# Patient Record
Sex: Female | Born: 1965
Health system: Southern US, Community
[De-identification: ages and names within clinical notes are randomized; demographics above are authoritative.]

## PROBLEM LIST (undated history)

## (undated) DIAGNOSIS — R519 Headache, unspecified: Secondary | ICD-10-CM

## (undated) DIAGNOSIS — IMO0002 Reserved for concepts with insufficient information to code with codable children: Secondary | ICD-10-CM

## (undated) DIAGNOSIS — M1990 Unspecified osteoarthritis, unspecified site: Secondary | ICD-10-CM

## (undated) DIAGNOSIS — F32A Depression, unspecified: Secondary | ICD-10-CM

## (undated) DIAGNOSIS — F329 Major depressive disorder, single episode, unspecified: Secondary | ICD-10-CM

## (undated) DIAGNOSIS — M549 Dorsalgia, unspecified: Secondary | ICD-10-CM

## (undated) DIAGNOSIS — K635 Polyp of colon: Secondary | ICD-10-CM

## (undated) DIAGNOSIS — F419 Anxiety disorder, unspecified: Secondary | ICD-10-CM

## (undated) DIAGNOSIS — R51 Headache: Secondary | ICD-10-CM

## (undated) HISTORY — PX: TUBAL LIGATION: SHX77

## (undated) HISTORY — PX: BREAST REDUCTION SURGERY: SHX8

## (undated) HISTORY — PX: MANDIBLE FRACTURE SURGERY: SHX706

## (undated) HISTORY — PX: FRACTURE SURGERY: SHX138

## (undated) HISTORY — PX: COLONOSCOPY WITH ESOPHAGOGASTRODUODENOSCOPY (EGD): SHX5779

## (undated) HISTORY — PX: OTHER SURGICAL HISTORY: SHX169

---

## 2003-02-13 ENCOUNTER — Observation Stay (HOSPITAL_COMMUNITY): Admission: RE | Admit: 2003-02-13 | Discharge: 2003-02-14 | Payer: Self-pay | Admitting: Oral and Maxillofacial Surgery

## 2004-08-18 ENCOUNTER — Ambulatory Visit: Payer: Self-pay

## 2005-03-14 ENCOUNTER — Other Ambulatory Visit: Admission: RE | Admit: 2005-03-14 | Discharge: 2005-03-14 | Payer: Self-pay | Admitting: Obstetrics and Gynecology

## 2007-05-08 ENCOUNTER — Other Ambulatory Visit: Admission: RE | Admit: 2007-05-08 | Discharge: 2007-05-08 | Payer: Self-pay | Admitting: Obstetrics and Gynecology

## 2007-08-21 ENCOUNTER — Ambulatory Visit: Payer: Self-pay | Admitting: Family Medicine

## 2007-11-20 ENCOUNTER — Ambulatory Visit: Payer: Self-pay

## 2008-03-04 ENCOUNTER — Encounter: Payer: Self-pay | Admitting: Anesthesiology

## 2008-07-20 ENCOUNTER — Ambulatory Visit: Payer: Self-pay

## 2008-11-23 ENCOUNTER — Ambulatory Visit: Payer: Self-pay | Admitting: Family Medicine

## 2008-12-08 ENCOUNTER — Ambulatory Visit: Payer: Self-pay | Admitting: Gastroenterology

## 2009-02-16 ENCOUNTER — Ambulatory Visit: Payer: Self-pay

## 2009-03-24 ENCOUNTER — Ambulatory Visit: Payer: Self-pay | Admitting: Physician Assistant

## 2009-11-24 ENCOUNTER — Ambulatory Visit: Payer: Self-pay | Admitting: Family Medicine

## 2010-09-09 NOTE — Op Note (Signed)
[name], BEHL                         ACCOUNT NO.:  [id number]   MEDICAL RECORD NO.:  [id number]                   PATIENT TYPE:  OBV   LOCATION:  0469                                 FACILITY:  [facility]   PHYSICIAN:  Lyndal Pulley. Chales Salmon, M.D.                DATE OF BIRTH:  Sep 06, 1965   DATE OF PROCEDURE:  02/13/2003  DATE OF DISCHARGE:  02/14/2003                                 OPERATIVE REPORT   PREOPERATIVE DIAGNOSIS:  Submental lipodystrophy.   POSTOPERATIVE DIAGNOSIS:  Submental lipodystrophy.   OPERATION/PROCEDURE:  Submental liposuction.   SURGEON:  Lyndal Pulley. Chales Salmon, M.D.   ASSISTANT SURGEON:  De Nurse, M.D.   ANESTHESIA:  General, tumescent and local.   INDICATIONS:  The patient is undergoing upper and lower jaw reconstructive  surgery for correction of a severe malocclusion.  She also was unhappy with  the appearance of her neck form and undergoing liposuction to correct her  neck form simultaneously with the corrective jaw surgery.   DESCRIPTION OF PROCEDURE:  The patient was identified in the holding area  and taken to the operating room where she was placed supine on the operating  room table.  Initially intravenous anesthesia was given and the patient was  intubated through the right nare without difficulty.  A nasoendotracheal  tube was secured and the patient was then turned 90 degrees from anesthesia.  The patient was then prepped and draped in the usual fashion for  orthognathic procedures.   Attention initially was directed to the submental area where a fine surgical  marker was used to outline the areas to be liposuctioned.  This included the  inferior borders of the mandible bilaterally and the thyroid prominence in  the midline.  Two percent lidocaine with 1:100,000 epinephrine was  infiltrated along the submental crease and two areas just beneath the  earlobes bilaterally.  A small stab incision was created with a #11 scalpel  and through the  small stab incision, tumescent fluid was infiltrated in the  subcutaneous supraplatysmal plane for a total of 75 mL.  The tumescent fluid  consisted 500 mL of lactated Ringer's mixed with 400 mg of lidocaine and 1  mg of epinephrine.  Approximately 15 minutes was allowed for hemostasis and  anesthesia.   Next, through the small stab incision, a 2 mm spatula-shaped liposuction  cannula was used to initially create a dissection plane subcutaneously.  This was carefully achieved throughout the entire submental and  submandibular areas.  Once the dissection was completed, a 3 mm, cobra-tip,  liposuction cannula was used to complete the liposuction in a supraplatysmal  subcutaneous plane.  A 2 mm spatula-shape liposuction was then used to  refine the areas as well.  The liposuction was better defined in the  submandibular area through the two small stab incisions created just  inferior to the earlobe.  The pinch test was used to assure  symmetry and  adequate removal of fat.  Once  liposuction was completed, a 6-0 fast-absorbing gut suture was used to close  the small fat incision.  After completion of the orthognathic portion of the  case, a standard facial pressure dressing was placed with quarter-inch foam  placed in the submental area, concentrating pressure in that area.  This was  followed by a four-inch Ace wrap.                                               Lyndal Pulley Chales Salmon, M.D.    TGO/MEDQ  D:  03/02/2003  T:  03/02/2003  Job:  [id number]

## 2010-09-09 NOTE — Op Note (Signed)
[name], [name]                         ACCOUNT NO.:  [id number]   MEDICAL RECORD NO.:  [id number]                   PATIENT TYPE:  OBV   LOCATION:  0469                                 FACILITY:  [facility]   PHYSICIAN:  Lyndal Pulley. Chales Salmon, M.D.                DATE OF BIRTH:  [birth year]   DATE OF PROCEDURE:  02/13/2003  DATE OF DISCHARGE:                                 OPERATIVE REPORT   PREOPERATIVE DIAGNOSES:  1. Maxillary vertical excess.  2. Maxillary skeletal asymmetry.  3. Mandibular sagittal deficiency.  4. Mandibular skeletal asymmetry.   POSTOPERATIVE DIAGNOSES:  1. Maxillary vertical excess.  2. Maxillary skeletal asymmetry.  3. Mandibular sagittal deficiency.  4. Mandibular skeletal asymmetry.   OPERATIONS PERFORMED:  1. Maxillary LeFort I osteotomy with rigid internal fixation.  2. Mandibular bilateral sagittal split ramus osteotomies, with rigid     internal fixation.   SURGEON:  Lyndal Pulley. Chales Salmon, M.D.   RESIDENT SURGEON:  Benjamine Sprague, M.D.   ANESTHESIA:  General via nasoendotracheal tube.   INDICATIONS:  The patient is a 45 year old lady who has been undergoing  orthodontic treatment with Dr. Skip Cantu over the past several years, in  preparation for jaw surgery.  Following detailed clinical records,  diagnostic models and cephalometric x-ray analysis, the above diagnoses were  confirmed.   DESCRIPTION OF PROCEDURE:  The patient was identified in the holding area  and taken to the operating room, where she was placed supine on the  operating room table.  Following the adequate induction of anesthesia, the  patient was nasally intubated without difficulty.  The nasoendotracheal  tube was secured, and the patient turned 90 degrees from anesthesia; there a  standard head wrap was placed to secure the nasoendotracheal tube.  The  patient was then prepped and draped in the usual fashion for the  aforementioned procedures.   Attention initially was directed  intraorally, where approximately 15 cc of  2% lidocaine with 1:100,000 epinephrine was infiltrated along the posterior  mandibular vestibule areas bilaterally.  Additional 10 cc was placed along  the entire maxillary vestibule.  Approximately 15 min was allowed for  anesthesia.   Attention was then directed initially to the right posterior mandibular  vestibule area, where a #15 scalpel was used to make a full-thickness  mucosal incision.  Dissection was carried down to the posterior mandible,  where using a #9 periosteal elevator a full-thickness mucoperiosteal  dissection was completed over the posterior mandibular body, ascending ramus  and medial ramus.  Using adequate retraction, the lingula was identified  along the medial ramus and a Stryker reciprocating saw was used to create  the initial horizontal medial osteotomy, just superior to the lingula.  This  osteotomy was carried through the outer cortex, and then directed in the  sagittal fascia along the ascending ramus anteriorly, to approximately the  second molar tooth.   Next, the  lateral vertical osteotomy was completed in a vertical fashion  through the inferior border of the mandible, completing the osteotomy.  A  moist pack was placed and attention directed to the left posterior mandible.  There the identical procedure was performed.   Attention was then directed to the maxilla, where again a #15 scalpel was  used to create a circumvestibular incision.  This was carried down through  the anterior and lateral maxillary wall.  A #9 periosteal elevator was used  to further reflect tissue, exposing the piriform aperture and lateral  maxillary wall.  Next, a Woodfin elevator was used to carefully dissect the  nasal mucosa from the surrounding bony nasal cavity.   Next, again using adequate retraction, the reciprocating saw was used to  make a horizontal osteotomy through the lateral and anterior maxillary walls  into the bony  nasal cavity.  This was done bilaterally.   Next, using a series of osteotomes, the osteotomy was completed.  The  maxilla was down-fractured without difficulty.  The nasal mucosa was found  to be intact.  The maxilla was further mobilized and all bony interferences  removed with both rongeurs and a large round bur.   The intermediate acrylic splint was then placed.  The patient was brought  into intramaxillary fixation with 26-gauge stainless steel wire loops.  The  maxillomandibular complex was then brought superiorly, and using an external  reference K-wire placed at nasion, the maxilla was brought approximately 5  mm superior.  Once the bony interferences were removed and the maxilla was  brought superiorly passively and into its proper condylar relationship,  rigid fixation was achieved with four L-shaped 1.6 mm Osteo-Med plates.   Prior to the rigid fixation, an anterior septoplasty was completed, to  remove the interfering cartilaginous nasal septum.  This was done in a  subpericondylar fashion.  The wound was closed with 4-0 plain gut suture.   Once the rigid fixation had been achieved, the patient was released from  intramaxillary fixation.  Found to occlude well into her intermediate  acrylic splint.   Attention was then directed to the right posterior mandibular area again.  There, using a series of osteotomes, the mandibular osteotomy was completed.  Once the osteotomy was felt to be completed, further observation found that  the buckled plate had been fractured free from the rest of the mandibular  segment.  The osteotomy was then completed again with the reciprocating saw  and a series of osteotomes, which then separated the proximal from  mandibular segment.  A moist pack was placed and attention directed to the  left posterior mandible, where an identical procedure was performed.  On both sides it was noted that the inferior alveolar nerve was seen and  intact along  its entire course.  The patient was then once again brought  into intramaxillary fixation, using the final acrylic splint with 26-gauge  stainless steel wire loops.   Attention was then directed to the right posterior mandible, where the  proximal segment was placed into its proper condylar relationship and held  secure with the modified Allis clamp.  A 2.0 mm plate was then fashioned to  lie passively across the osteotomy site, held secure with four monocortical  2.0 Osteo-Med screws.  The modified Allis clamp was released and the rigid  fixation confirmed.  The separate buckle plate segment was then placed over  the osteotomy site as well, and held secure with two 2.0 mm lag Osteo-Med  screws.   Attention was then directed to the left posterior mandible, where again the  proximal segment was placed into its proper condylar relationship and held  secure with a modified Allis clamp.  There was some skeletal interferences  between the proximal and distal segments, which were removed both with  rongeurs and a 703 tapered fissure bur.  This allowed the segments to be  brought together passively.  Rigid fixation was then achieved with three  superior border 2.0 Osteo-Med lag screws.  The modified Allis clamp was  removed.  The osteotomy rigid fixation confirmed.   The patient was then released from intramaxillary fixation and occluded well  into her surgical splint.  The surgical splint was removed, and again her  occlusion was adequate.  Both operative sites were then irrigated with  copious amounts of sterile saline, and the mandibular sites closed with 3-0  plain gut suture in running baseball fashion.  Initially in the maxilla, a  nasal cinch suture was placed with an 0 Vicryl suture.  The mucosa was then  closed in an advancement VY fashion, using 3-0 plain gut suture.  The entire  oral cavity was then irrigated with copious amounts of sterile saline and  suctioned free of clots and  debris.  The throat pack was removed and the  final surgical splint wired to the orthodontic appliances of the maxilla  with 26-gauge wire.   She was then again brought into intramaxillary fixation, with heavy bolster  elastics.  A standard facial pressure dressing was then placed.  She was  then allowed to awaken from the anesthesia, extubated in the operating room  and transferred to the post-anesthesia care unit in stable condition.  She  tolerated the procedure well.   ESTIMATED BLOOD LOSS:  100 cc.   INTRAOPERATIVE MEDICATIONS:  1. Ancef 3 g.  2. Decadron 20 mg.                                                 Lyndal Pulley Chales Salmon, M.D.    TGO/MEDQ  D:  02/14/2003  T:  02/14/2003  Job:  [id number]   cc:   Benjamine Sprague, M.D.

## 2011-02-24 ENCOUNTER — Ambulatory Visit: Payer: Self-pay | Admitting: Family Medicine

## 2011-03-15 ENCOUNTER — Ambulatory Visit: Payer: Self-pay | Admitting: Internal Medicine

## 2011-04-08 ENCOUNTER — Ambulatory Visit: Payer: Self-pay | Admitting: Internal Medicine

## 2011-05-17 ENCOUNTER — Ambulatory Visit: Payer: Self-pay | Admitting: Pain Medicine

## 2011-05-23 ENCOUNTER — Ambulatory Visit: Payer: Self-pay | Admitting: Pain Medicine

## 2011-05-29 ENCOUNTER — Ambulatory Visit: Payer: Self-pay | Admitting: Pain Medicine

## 2011-06-02 ENCOUNTER — Ambulatory Visit: Payer: Self-pay | Admitting: Internal Medicine

## 2011-06-02 LAB — CBC WITH DIFFERENTIAL/PLATELET
Basophil #: 0.1 10*3/uL (ref 0.0–0.1)
Basophil %: 0.6 %
Eosinophil #: 1 10*3/uL — ABNORMAL HIGH (ref 0.0–0.7)
Eosinophil %: 8.8 %
HCT: 38.5 % (ref 35.0–47.0)
HGB: 12.8 g/dL (ref 12.0–16.0)
Lymphocyte #: 3.7 10*3/uL — ABNORMAL HIGH (ref 1.0–3.6)
Lymphocyte %: 32.9 %
MCH: 32.8 pg (ref 26.0–34.0)
MCHC: 33.2 g/dL (ref 32.0–36.0)
MCV: 99 fL (ref 80–100)
Monocyte #: 0.9 10*3/uL — ABNORMAL HIGH (ref 0.0–0.7)
Monocyte %: 8.3 %
Neutrophil #: 5.4 10*3/uL (ref 1.4–6.5)
Neutrophil %: 49.4 %
Platelet: 344 10*3/uL (ref 150–440)
RBC: 3.9 10*6/uL (ref 3.80–5.20)
RDW: 13.2 % (ref 11.5–14.5)
WBC: 11.1 10*3/uL — ABNORMAL HIGH (ref 3.6–11.0)

## 2011-06-02 LAB — COMPREHENSIVE METABOLIC PANEL
Albumin: 3.8 g/dL (ref 3.4–5.0)
Alkaline Phosphatase: 82 U/L (ref 50–136)
Anion Gap: 5 — ABNORMAL LOW (ref 7–16)
BUN: 11 mg/dL (ref 7–18)
Bilirubin,Total: 0.1 mg/dL — ABNORMAL LOW (ref 0.2–1.0)
Calcium, Total: 9.1 mg/dL (ref 8.5–10.1)
Chloride: 103 mmol/L (ref 98–107)
Co2: 32 mmol/L (ref 21–32)
Creatinine: 0.84 mg/dL (ref 0.60–1.30)
EGFR (African American): >60
EGFR (Non-African Amer.): >60
Glucose: 110 mg/dL — ABNORMAL HIGH (ref 65–99)
Osmolality: 279 (ref 275–301)
Potassium: 3.8 mmol/L (ref 3.5–5.1)
SGOT(AST): 29 U/L (ref 15–37)
SGPT (ALT): 29 U/L
Sodium: 140 mmol/L (ref 136–145)
Total Protein: 6.9 g/dL (ref 6.4–8.2)

## 2011-06-02 LAB — URINALYSIS, COMPLETE
Bacteria: NEGATIVE
Bilirubin,UR: NEGATIVE
Blood: NEGATIVE
Glucose,UR: NEGATIVE mg/dL (ref 0–75)
Ketone: NEGATIVE
Leukocyte Esterase: NEGATIVE
Nitrite: NEGATIVE
Ph: 6 (ref 4.5–8.0)
Protein: NEGATIVE
RBC,UR: NONE SEEN /HPF (ref 0–5)
Specific Gravity: 1.005 (ref 1.003–1.030)

## 2011-06-04 LAB — URINE CULTURE

## 2011-06-07 ENCOUNTER — Ambulatory Visit: Payer: Self-pay | Admitting: Internal Medicine

## 2011-06-15 ENCOUNTER — Ambulatory Visit: Payer: Self-pay | Admitting: Pain Medicine

## 2011-07-03 ENCOUNTER — Ambulatory Visit: Payer: Self-pay | Admitting: Pain Medicine

## 2011-07-06 ENCOUNTER — Ambulatory Visit: Payer: Self-pay | Admitting: Pain Medicine

## 2011-07-18 ENCOUNTER — Institutional Professional Consult (permissible substitution): Payer: Self-pay | Admitting: Nurse Practitioner

## 2011-08-01 ENCOUNTER — Institutional Professional Consult (permissible substitution): Payer: Self-pay | Admitting: Nurse Practitioner

## 2011-08-01 DIAGNOSIS — R51 Headache: Secondary | ICD-10-CM

## 2011-11-20 ENCOUNTER — Emergency Department: Payer: Self-pay | Admitting: Emergency Medicine

## 2012-03-07 ENCOUNTER — Emergency Department: Payer: Self-pay | Admitting: Emergency Medicine

## 2012-05-24 DIAGNOSIS — G43909 Migraine, unspecified, not intractable, without status migrainosus: Secondary | ICD-10-CM | POA: Insufficient documentation

## 2012-05-24 DIAGNOSIS — F3189 Other bipolar disorder: Secondary | ICD-10-CM | POA: Insufficient documentation

## 2012-05-24 DIAGNOSIS — K219 Gastro-esophageal reflux disease without esophagitis: Secondary | ICD-10-CM | POA: Insufficient documentation

## 2012-05-24 DIAGNOSIS — L732 Hidradenitis suppurativa: Secondary | ICD-10-CM | POA: Insufficient documentation

## 2012-08-16 DIAGNOSIS — L4050 Arthropathic psoriasis, unspecified: Secondary | ICD-10-CM | POA: Insufficient documentation

## 2012-08-16 DIAGNOSIS — L409 Psoriasis, unspecified: Secondary | ICD-10-CM | POA: Insufficient documentation

## 2012-12-20 DIAGNOSIS — N3946 Mixed incontinence: Secondary | ICD-10-CM | POA: Insufficient documentation

## 2012-12-20 DIAGNOSIS — R278 Other lack of coordination: Secondary | ICD-10-CM | POA: Insufficient documentation

## 2012-12-20 DIAGNOSIS — R159 Full incontinence of feces: Secondary | ICD-10-CM | POA: Insufficient documentation

## 2012-12-20 DIAGNOSIS — N816 Rectocele: Secondary | ICD-10-CM | POA: Insufficient documentation

## 2012-12-20 DIAGNOSIS — IMO0002 Reserved for concepts with insufficient information to code with codable children: Secondary | ICD-10-CM | POA: Insufficient documentation

## 2013-02-21 ENCOUNTER — Ambulatory Visit: Payer: Self-pay | Admitting: Physician Assistant

## 2013-02-27 ENCOUNTER — Ambulatory Visit: Payer: Self-pay | Admitting: Family Medicine

## 2013-04-15 ENCOUNTER — Ambulatory Visit: Payer: Self-pay | Admitting: Family Medicine

## 2013-04-22 ENCOUNTER — Ambulatory Visit: Payer: Self-pay | Admitting: Family Medicine

## 2013-08-18 ENCOUNTER — Ambulatory Visit: Payer: Self-pay | Admitting: Podiatry

## 2013-08-19 ENCOUNTER — Emergency Department: Payer: Self-pay | Admitting: Emergency Medicine

## 2014-09-01 ENCOUNTER — Other Ambulatory Visit: Payer: Self-pay | Admitting: Anesthesiology

## 2014-09-01 DIAGNOSIS — M5441 Lumbago with sciatica, right side: Secondary | ICD-10-CM

## 2014-09-01 DIAGNOSIS — M5442 Lumbago with sciatica, left side: Principal | ICD-10-CM

## 2014-09-05 ENCOUNTER — Ambulatory Visit
Admission: RE | Admit: 2014-09-05 | Discharge: 2014-09-05 | Disposition: A | Discharge status: Home / Self Care | Payer: Medicaid Other | Source: Ambulatory Visit | Attending: Anesthesiology | Admitting: Anesthesiology

## 2014-09-05 DIAGNOSIS — M5442 Lumbago with sciatica, left side: Secondary | ICD-10-CM

## 2014-09-05 DIAGNOSIS — M545 Low back pain: Secondary | ICD-10-CM | POA: Insufficient documentation

## 2014-09-05 DIAGNOSIS — M5441 Lumbago with sciatica, right side: Secondary | ICD-10-CM

## 2015-11-15 ENCOUNTER — Emergency Department
Admission: EM | Admit: 2015-11-15 | Discharge: 2015-11-15 | Disposition: A | Discharge status: Home / Self Care | Payer: Medicaid Other | Attending: Emergency Medicine | Admitting: Emergency Medicine

## 2015-11-15 ENCOUNTER — Emergency Department: Payer: Medicaid Other

## 2015-11-15 DIAGNOSIS — F17200 Nicotine dependence, unspecified, uncomplicated: Secondary | ICD-10-CM | POA: Insufficient documentation

## 2015-11-15 DIAGNOSIS — Y929 Unspecified place or not applicable: Secondary | ICD-10-CM | POA: Insufficient documentation

## 2015-11-15 DIAGNOSIS — X501XXA Overexertion from prolonged static or awkward postures, initial encounter: Secondary | ICD-10-CM | POA: Insufficient documentation

## 2015-11-15 DIAGNOSIS — Y9301 Activity, walking, marching and hiking: Secondary | ICD-10-CM | POA: Insufficient documentation

## 2015-11-15 DIAGNOSIS — S93601A Unspecified sprain of right foot, initial encounter: Secondary | ICD-10-CM | POA: Insufficient documentation

## 2015-11-15 DIAGNOSIS — S93401A Sprain of unspecified ligament of right ankle, initial encounter: Secondary | ICD-10-CM | POA: Diagnosis not present

## 2015-11-15 DIAGNOSIS — Y999 Unspecified external cause status: Secondary | ICD-10-CM | POA: Diagnosis not present

## 2015-11-15 DIAGNOSIS — M25571 Pain in right ankle and joints of right foot: Secondary | ICD-10-CM | POA: Diagnosis present

## 2015-11-15 MED ORDER — ACETAMINOPHEN ER 650 MG PO TBCR: 650.0000 mg | EXTENDED_RELEASE_TABLET | Freq: Three times a day (TID) | ORAL | 0 refills | Status: AC | PRN

## 2015-11-15 MED ORDER — ACETAMINOPHEN 325 MG PO TABS
650.0000 mg | ORAL_TABLET | Freq: Once | ORAL | Status: AC
  Administered 2015-11-15: 650 mg via ORAL
  Filled 2015-11-15: qty 2

## 2015-11-15 NOTE — ED Provider Notes (Signed)
[facility] Emergency Department Provider Note  ____________________________________________  Time seen: Approximately 8:37 AM  I have reviewed the triage vital signs and the nursing notes.   HISTORY  Chief Complaint Ankle Pain    HPI [name] is a 50 y.o. female , NAD, presents to the emergency department with one-day history of right ankle and foot pain. States she was walking and twisted the right ankle and foot. Denies any fall or head injury. Do not have any visual changes, chest pain, shortness of breath to cause the fall. Has had pain and swelling about the medial right ankle and foot since that time. Denies any open wounds or lacerations. Has not had any numbness, weakness, tingling.Does note that she has fractured the right ankle and foot in the past.   History reviewed. No pertinent past medical history.  There are no active problems to display for this patient.   Past Surgical History:  Procedure Laterality Date  . BREAST REDUCTION SURGERY    . FRACTURE SURGERY    . MANDIBLE FRACTURE SURGERY    . TUBAL LIGATION      Current Outpatient Rx  . Order #: [id number] Class: Historical Med  . Order #: [id number] Class: Print    Allergies Review of patient's allergies indicates no known allergies.  No family history on file.  Social History Social History  Substance Use Topics  . Smoking status: Current Every Day Smoker  . Smokeless tobacco: Never Used  . Alcohol use No     Review of Systems  Constitutional: No fatigue Eyes: No visual changes. Cardiovascular: No chest pain. Respiratory:  No shortness of breath. Musculoskeletal: Positive right foot and ankle pain. Negative for back, knee, hip pain.  Skin: Eyes of swelling and bruising right foot. Negative for rash. Neurological: Negative for headaches, focal weakness or numbness. No tingling 10-point ROS otherwise negative.  ____________________________________________   PHYSICAL  EXAM:  VITAL SIGNS: ED Triage Vitals [11/15/15 0822]  Enc Vitals Group     BP (!) 101/56     Pulse Rate 65     Resp 16     Temp 97.9 F (36.6 C)     Temp Source Oral     SpO2 98 %     Weight 183 lb (83 kg)     Height 5\' 3"  (1.6 m)     Head Circumference      Peak Flow      Pain Score 8     Pain Loc      Pain Edu?      Excl. in [place]?      Constitutional: Alert and oriented. Well appearing and in no acute distress. Eyes: Conjunctivae are normal.  Head: Atraumatic. Cardiovascular: Good peripheral circulation with 2+ pulses noted in the right lower extremity. Capillary refill is brisk in all digits of the right foot. Respiratory: Normal respiratory effort without tachypnea or retractions.  Musculoskeletal: Tenderness to palpation about the medial right ankle and foot without any bony abnormalities or crepitus noted. Full range of motion of the right ankle but with pain with full extension. No laxity with anterior posterior drawer. No laxity with varus or valgus stress. No pain with inversion or eversion. No lower extremity tenderness nor edema.  No joint effusions. Neurologic:  Normal speech and language. No gross focal neurologic deficits are appreciated.  Skin:  Trace swelling and blue ecchymosis noted about the medial right foot. No open wounds or lacerations. No skin sores. Skin is warm, dry and intact.  No rash noted. Psychiatric: Mood and affect are normal. Speech and behavior are normal. Patient exhibits appropriate insight and judgement.   ____________________________________________   LABS  None ____________________________________________  EKG  None ____________________________________________  RADIOLOGY I have personally viewed and evaluated these images (plain radiographs) as part of my medical decision making, as well as reviewing the written report by the radiologist.  Dg Ankle Complete Right  Result Date: 11/15/2015 CLINICAL DATA:  Pain following rolling  type injury 1 day prior EXAM: RIGHT ANKLE - COMPLETE 3+ VIEW COMPARISON:  None. FINDINGS: Frontal, oblique, and lateral views were obtained. There is mild generalized soft tissue swelling. There is no appreciable fracture or joint effusion. Ankle mortise appears intact. There is no appreciable joint space narrowing. There is a minimal posterior calcaneal spur. IMPRESSION: Soft tissue swelling. No demonstrable fracture. Ankle mortise appears intact. Minimal posterior calcaneal spur. Electronically Signed   By: Lowella Grip III M.D.   On: 11/15/2015 09:00  Dg Foot Complete Right  Result Date: 11/15/2015 CLINICAL DATA:  Rolling type injury 1 day prior with pain medially EXAM: RIGHT FOOT COMPLETE - 3+ VIEW COMPARISON:  None. FINDINGS: Frontal, oblique, lateral views were obtained. Patient has had a previous bunionectomy. There are hands the distal first metatarsal. There is evidence of a prior fracture along the proximal aspect of the fifth proximal phalanx with a displaced bony fragment medially. This bony fragment as well corticated suggesting chronicity of the finding. A tiny calcification in the lateral aspect of the first MTP joint is likely of arthropathic etiology. No acute fracture is appreciable. No dislocation. The joint spaces appear unremarkable. There is a minimal posterior calcaneal spur. IMPRESSION: Postoperative change in the distal first metatarsal. Evidence of prior fracture along medial aspect of the proximal portion of the fifth proximal phalanx. The small avulsed bony fragment in this area is well corticated, indicating chronicity. No acute fracture or dislocation evident. There is a minimal posterior calcaneal spur. A small calcification lateral aspect first MTP joint is felt to be of arthropathic etiology. Electronically Signed   By: Lowella Grip III M.D.   On: 11/15/2015 08:59   ____________________________________________    PROCEDURES  Procedure(s) performed:  None      Medications  acetaminophen (TYLENOL) tablet 650 mg (650 mg Oral Given 11/15/15 0912)     ____________________________________________   INITIAL IMPRESSION / ASSESSMENT AND PLAN / ED COURSE  Pertinent labs & imaging results that were available during my care of the patient were reviewed by me and considered in my medical decision making (see chart for details).  Patient's diagnosis is consistent with right ankle and foot sprain. Patient was placed in an Ace wrap and given crutches for comfort care. Should apply ice and elevate the right foot and ankle when not ambulating. Patient will be discharged home with prescriptions for Tylenol to take as needed. Patient is to follow up with her primary care provider if symptoms persist past this treatment course. Patient is given ED precautions to return to the ED for any worsening or new symptoms.      ____________________________________________  FINAL CLINICAL IMPRESSION(S) / ED DIAGNOSES  Final diagnoses:  Ankle sprain, right, initial encounter  Foot sprain, right, initial encounter      NEW MEDICATIONS STARTED DURING THIS VISIT:  Discharge Medication List as of 11/15/2015  9:08 AM    START taking these medications   Details  acetaminophen (ACETAMINOPHEN 8 HOUR) 650 MG CR tablet Take 1 tablet (650 mg total) by mouth  every 8 (eight) hours as needed for pain., Starting Mon 11/15/2015, Until Mon 11/22/2015, [place], PA-C 11/15/15 1012    Nance Pear, MD 11/15/15 1158

## 2015-11-15 NOTE — Discharge Instructions (Signed)
Rest, Ice, Elevate right foot/ankle as needed per exit care instructions.

## 2015-11-15 NOTE — ED Notes (Signed)
States she rolled her right foot while walking this am   Having increased pain across top of foot positive pulses   Unable to bear full wt d/t pain

## 2015-11-15 NOTE — ED Triage Notes (Signed)
Pt states she tripped and fell yesterday and is c/o right foot/ankle pain,,

## 2016-02-21 ENCOUNTER — Other Ambulatory Visit: Payer: Self-pay | Admitting: Gastroenterology

## 2016-02-21 DIAGNOSIS — K219 Gastro-esophageal reflux disease without esophagitis: Secondary | ICD-10-CM

## 2016-02-21 DIAGNOSIS — R1310 Dysphagia, unspecified: Secondary | ICD-10-CM

## 2016-02-29 ENCOUNTER — Ambulatory Visit: Admission: RE | Admit: 2016-02-29 | Payer: Medicaid Other | Source: Ambulatory Visit

## 2016-03-09 ENCOUNTER — Ambulatory Visit: Payer: Medicaid Other | Attending: Gastroenterology

## 2016-05-18 ENCOUNTER — Encounter: Payer: Self-pay | Admitting: *Deleted

## 2016-05-19 ENCOUNTER — Encounter
Admission: RE | Disposition: A | Discharge status: Home / Self Care | Payer: Self-pay | Source: Ambulatory Visit | Attending: Gastroenterology

## 2016-05-19 ENCOUNTER — Ambulatory Visit
Admission: RE | Admit: 2016-05-19 | Discharge: 2016-05-19 | Disposition: A | Discharge status: Home / Self Care | Payer: Medicaid Other | Source: Ambulatory Visit | Attending: Gastroenterology | Admitting: Gastroenterology

## 2016-05-19 ENCOUNTER — Encounter: Payer: Self-pay | Admitting: *Deleted

## 2016-05-19 ENCOUNTER — Ambulatory Visit: Payer: Medicaid Other | Admitting: Anesthesiology

## 2016-05-19 DIAGNOSIS — K621 Rectal polyp: Secondary | ICD-10-CM | POA: Diagnosis not present

## 2016-05-19 DIAGNOSIS — Z86010 Personal history of colonic polyps: Secondary | ICD-10-CM | POA: Insufficient documentation

## 2016-05-19 DIAGNOSIS — Z79899 Other long term (current) drug therapy: Secondary | ICD-10-CM | POA: Diagnosis not present

## 2016-05-19 DIAGNOSIS — F419 Anxiety disorder, unspecified: Secondary | ICD-10-CM | POA: Diagnosis not present

## 2016-05-19 DIAGNOSIS — Z1211 Encounter for screening for malignant neoplasm of colon: Secondary | ICD-10-CM | POA: Diagnosis not present

## 2016-05-19 DIAGNOSIS — R51 Headache: Secondary | ICD-10-CM | POA: Insufficient documentation

## 2016-05-19 DIAGNOSIS — F329 Major depressive disorder, single episode, unspecified: Secondary | ICD-10-CM | POA: Insufficient documentation

## 2016-05-19 DIAGNOSIS — F17200 Nicotine dependence, unspecified, uncomplicated: Secondary | ICD-10-CM | POA: Diagnosis not present

## 2016-05-19 DIAGNOSIS — M1990 Unspecified osteoarthritis, unspecified site: Secondary | ICD-10-CM | POA: Diagnosis not present

## 2016-05-19 HISTORY — DX: Anxiety disorder, unspecified: F41.9

## 2016-05-19 HISTORY — DX: Depression, unspecified: F32.A

## 2016-05-19 HISTORY — DX: Headache: R51

## 2016-05-19 HISTORY — PX: COLONOSCOPY WITH PROPOFOL: SHX5780

## 2016-05-19 HISTORY — DX: Major depressive disorder, single episode, unspecified: F32.9

## 2016-05-19 HISTORY — DX: Headache, unspecified: R51.9

## 2016-05-19 HISTORY — DX: Unspecified osteoarthritis, unspecified site: M19.90

## 2016-05-19 HISTORY — DX: Reserved for concepts with insufficient information to code with codable children: IMO0002

## 2016-05-19 LAB — POCT PREGNANCY, URINE: Preg Test, Ur: NEGATIVE

## 2016-05-19 SURGERY — COLONOSCOPY WITH PROPOFOL
Anesthesia: General

## 2016-05-19 MED ORDER — DEXAMETHASONE SODIUM PHOSPHATE 10 MG/ML IJ SOLN
INTRAMUSCULAR | Status: AC
  Filled 2016-05-19: qty 1

## 2016-05-19 MED ORDER — PROPOFOL 500 MG/50ML IV EMUL
INTRAVENOUS | Status: AC
  Filled 2016-05-19: qty 50

## 2016-05-19 MED ORDER — FENTANYL CITRATE (PF) 100 MCG/2ML IJ SOLN
INTRAMUSCULAR | Status: AC
  Filled 2016-05-19: qty 2

## 2016-05-19 MED ORDER — EPHEDRINE 5 MG/ML INJ
INTRAVENOUS | Status: AC
  Filled 2016-05-19: qty 10

## 2016-05-19 MED ORDER — PROPOFOL 500 MG/50ML IV EMUL
INTRAVENOUS | Status: DC | PRN
  Administered 2016-05-19: 120 ug/kg/min via INTRAVENOUS

## 2016-05-19 MED ORDER — FENTANYL CITRATE (PF) 100 MCG/2ML IJ SOLN
INTRAMUSCULAR | Status: DC | PRN
  Administered 2016-05-19: 100 ug via INTRAVENOUS

## 2016-05-19 MED ORDER — SODIUM CHLORIDE 0.9 % IV SOLN: INTRAVENOUS | Status: DC

## 2016-05-19 MED ORDER — EPHEDRINE SULFATE 50 MG/ML IJ SOLN
INTRAMUSCULAR | Status: DC | PRN
  Administered 2016-05-19 (×2): 10 mg via INTRAVENOUS

## 2016-05-19 MED ORDER — LIDOCAINE HCL (CARDIAC) 20 MG/ML IV SOLN
INTRAVENOUS | Status: DC | PRN
  Administered 2016-05-19: 2 mL via INTRAVENOUS

## 2016-05-19 MED ORDER — SODIUM CHLORIDE 0.9 % IV SOLN
INTRAVENOUS | Status: DC
  Administered 2016-05-19: 09:00:00 via INTRAVENOUS

## 2016-05-19 MED ORDER — MIDAZOLAM HCL 2 MG/2ML IJ SOLN
INTRAMUSCULAR | Status: DC | PRN
  Administered 2016-05-19: 2 mg via INTRAVENOUS

## 2016-05-19 MED ORDER — PROPOFOL 10 MG/ML IV BOLUS
INTRAVENOUS | Status: DC | PRN
  Administered 2016-05-19: 30 mg via INTRAVENOUS

## 2016-05-19 MED ORDER — MIDAZOLAM HCL 2 MG/2ML IJ SOLN
INTRAMUSCULAR | Status: AC
  Filled 2016-05-19: qty 2

## 2016-05-19 NOTE — Anesthesia Post-op Follow-up Note (Signed)
Anesthesia QCDR form completed.        

## 2016-05-19 NOTE — Anesthesia Preprocedure Evaluation (Signed)
Anesthesia Evaluation  Patient identified by MRN, date of birth, ID band Patient awake    Reviewed: Allergy & Precautions, NPO status , Patient's Chart, lab work & pertinent test results  Airway Mallampati: II       Dental  (+) Teeth Intact   Pulmonary Current Smoker,    breath sounds clear to auscultation       Cardiovascular Exercise Tolerance: Good  Rhythm:Regular     Neuro/Psych  Headaches, Anxiety Depression    GI/Hepatic negative GI ROS, Neg liver ROS,   Endo/Other  negative endocrine ROS  Renal/GU negative Renal ROS     Musculoskeletal   Abdominal Normal abdominal exam  (+)   Peds negative pediatric ROS (+)  Hematology negative hematology ROS (+)   Anesthesia Other Findings   Reproductive/Obstetrics                             Anesthesia Physical Anesthesia Plan  ASA: II  Anesthesia Plan: General   Post-op Pain Management:    Induction: Intravenous  Airway Management Planned: Natural Airway and Nasal Cannula  Additional Equipment:   Intra-op Plan:   Post-operative Plan:   Informed Consent: I have reviewed the patients History and Physical, chart, labs and discussed the procedure including the risks, benefits and alternatives for the proposed anesthesia with the patient or authorized representative who has indicated his/her understanding and acceptance.     Plan Discussed with: Surgeon  Anesthesia Plan Comments:         Anesthesia Quick Evaluation

## 2016-05-19 NOTE — Anesthesia Postprocedure Evaluation (Signed)
Anesthesia Post Note  Patient: [name]  Procedure(s) Performed: Procedure(s) (LRB): COLONOSCOPY WITH PROPOFOL (N/A)  Patient location during evaluation: PACU Anesthesia Type: General Level of consciousness: awake Pain management: pain level controlled Vital Signs Assessment: post-procedure vital signs reviewed and stable Respiratory status: spontaneous breathing Cardiovascular status: stable Anesthetic complications: no     Last Vitals:  Vitals:   05/19/16 1027 05/19/16 1037  BP: 100/61 108/66  Pulse: 62 (!) 58  Resp: 13 15  Temp:      Last Pain:  Vitals:   05/19/16 1007  TempSrc: Tympanic                 VAN STAVEREN,Venezia Sargeant

## 2016-05-19 NOTE — H&P (Signed)
Outpatient short stay form Pre-procedure 05/19/2016 9:48 AM Lollie Sails MD  Primary Physician: Dr. Delight Stare  Reason for visit:  Colonoscopy  History of present illness:  Patient is a 51 year old female presenting today as above. She has personal history of colon polyps. She tolerated her prep well. She denies use of any aspirin or blood thinning agents.  It is of note that she was initially seen for dysphagia. We will arrange for a barium swallow prior to EGD however patient did not do the barium swallow. As such that has been rescheduled and were holding the EGD until we have that result.    Current Facility-Administered Medications:  .  0.9 %  sodium chloride infusion, , Intravenous, Continuous, Lollie Sails, MD, Last Rate: 20 mL/hr at 05/19/16 0859 .  0.9 %  sodium chloride infusion, , Intravenous, Continuous, Lollie Sails, MD  Prescriptions Prior to Admission  Medication Sig Dispense Refill Last Dose  . ALPRAZolam (XANAX) 1 MG tablet Take 1 mg by mouth 4 (four) times daily.   05/18/2016 at Unknown time  . amphetamine-dextroamphetamine (ADDERALL) 15 MG tablet Take 15 mg by mouth 2 (two) times daily.   Past Week at Unknown time  . buPROPion (WELLBUTRIN XL) 300 MG 24 hr tablet Take 300 mg by mouth daily.   05/18/2016 at Unknown time  . clobetasol ointment (TEMOVATE) [id number] % Apply 1 application topically 2 (two) times daily.     . diclofenac (FLECTOR) 1.3 % PTCH Place 1 patch onto the skin daily.     . diclofenac sodium (VOLTAREN) 1 % GEL Apply topically 4 (four) times daily.     Marland Kitchen escitalopram (LEXAPRO) 20 MG tablet Take 20 mg by mouth daily.   Past Week at Unknown time  . fluocinonide (LIDEX) 0.05 % external solution Apply 1 application topically 2 (two) times daily.     . fluticasone (FLONASE) 50 MCG/ACT nasal spray Place 2 sprays into both nostrils daily.     Marland Kitchen omeprazole (PRILOSEC) 20 MG capsule Take 20 mg by mouth 2 (two) times daily before a meal.   05/18/2016 at  Unknown time  . oxycodone (OXY-IR) 5 MG capsule Take 10 mg by mouth 3 (three) times daily.   Past Week at Unknown time     Allergies  Allergen Reactions  . Topiramate Hives     Past Medical History:  Diagnosis Date  . Anxiety   . Arthritis    PSORIATIC ARTHRITIS  . Depression   . Headache    MIGRAINE  . Ulcer ([place])     Review of systems:      Physical Exam    Heart and lungs: Regular rate and rhythm without rub or gallop, lungs are bilaterally clear.    HEENT: Normocephalic atraumatic eyes are anicteric    Other:     Pertinant exam for procedure: Soft nontender nondistended bowel sounds positive normoactive.    Planned proceedures: Colonoscopy and indicated procedures. I have discussed the risks benefits and complications of procedures to include not limited to bleeding, infection, perforation and the risk of sedation and the patient wishes to proceed.    Lollie Sails, MD Gastroenterology 05/19/2016  9:48 AM

## 2016-05-19 NOTE — Progress Notes (Signed)
Colonoscopy aborted due to poor prep. Rectal polyps noted per Dr. Donnella Sham but he will take those out on return visit

## 2016-05-19 NOTE — Transfer of Care (Signed)
Immediate Anesthesia Transfer of Care Note  Patient: [name]  Procedure(s) Performed: Procedure(s): COLONOSCOPY WITH PROPOFOL (N/A)  Patient Location: PACU  Anesthesia Type:General  Level of Consciousness: awake  Airway & Oxygen Therapy: Patient Spontanous Breathing and Patient connected to nasal cannula oxygen  Post-op Assessment: Report given to RN and Post -op Vital signs reviewed and stable  Post vital signs: Reviewed  Last Vitals:  Vitals:   05/19/16 0839  BP: 91/73  Pulse: 66  Resp: 20  Temp: 36.4 C    Last Pain:  Vitals:   05/19/16 0839  TempSrc: Tympanic         Complications: No apparent anesthesia complications

## 2016-05-19 NOTE — Op Note (Signed)
[facility] Gastroenterology Patient Name: [name] Procedure Date: 05/19/2016 9:37 AM MRN: [id number] Account #: [id number] Date of Birth: 11-Oct-1965 Admit Type: Outpatient Age: 51 Room: [facility] ENDO ROOM 1 Gender: Female Note Status: Finalized Procedure:            Colonoscopy Indications:          Personal history of colonic polyps Providers:            Lollie Sails, MD Referring MD:         Marguerita Merles, MD (Referring MD) Medicines:            Monitored Anesthesia Care Complications:        No immediate complications. Procedure:            Pre-Anesthesia Assessment:                       - ASA Grade Assessment: II - A patient with mild                        systemic disease.                       After obtaining informed consent, the colonoscope was                        passed under direct vision. Throughout the procedure,                        the patient's blood pressure, pulse, and oxygen                        saturations were monitored continuously. The                        Colonoscope was introduced through the anus with the                        intention of advancing to the cecum. The scope was                        advanced to the sigmoid colon before the procedure was                        aborted. Medications were given. The colonoscopy was                        performed with difficulty due to poor endoscopic                        visualization. The patient tolerated the procedure. The                        quality of the bowel preparation was poor. Findings:      Several small polyps were found in the rectum. The polyps were sessile.       Prep was not adequate for evaluation and these were not removed. Impression:           - Preparation of the colon was poor.                       -  Small polyps in the rectum.                       - No specimens collected. Recommendation:       - repeat prep and reschedule Procedure  Code(s):    --- Professional ---                       [phone], 53, Colonoscopy, flexible; diagnostic, including                        collection of specimen(s) by brushing or washing, when                        performed (separate procedure) Diagnosis Code(s):    --- Professional ---                       K62.1, Rectal polyp                       Z86.010, Personal history of colonic polyps CPT copyright 2016 [employer]. All rights reserved. The codes documented in this report are preliminary and upon coder review may  be revised to meet current compliance requirements. Lollie Sails, MD 05/19/2016 10:06:16 AM This report has been signed electronically. Number of Addenda: 0 Note Initiated On: 05/19/2016 9:37 AM Total Procedure Duration: 0 hours 5 minutes 26 seconds       [facility]

## 2016-05-22 ENCOUNTER — Encounter: Payer: Self-pay | Admitting: Gastroenterology

## 2016-10-02 ENCOUNTER — Other Ambulatory Visit: Payer: Self-pay | Admitting: Family Medicine

## 2016-10-02 DIAGNOSIS — Z1231 Encounter for screening mammogram for malignant neoplasm of breast: Secondary | ICD-10-CM

## 2016-10-23 DIAGNOSIS — F1190 Opioid use, unspecified, uncomplicated: Secondary | ICD-10-CM | POA: Insufficient documentation

## 2016-10-23 DIAGNOSIS — Z79891 Long term (current) use of opiate analgesic: Secondary | ICD-10-CM | POA: Insufficient documentation

## 2016-10-23 DIAGNOSIS — G894 Chronic pain syndrome: Secondary | ICD-10-CM | POA: Insufficient documentation

## 2016-10-24 ENCOUNTER — Ambulatory Visit: Payer: Medicaid Other | Attending: Nurse Practitioner | Admitting: Nurse Practitioner

## 2016-10-24 NOTE — Progress Notes (Deleted)
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 redness, or swelling Alignment: Symmetrical Functional ROM: Unrestricted ROM      Stability: No instability detected Muscle strength & Tone: Functionally intact Sensory: Unimpaired Palpation: No palpable anomalies       Provocative Tests: Lumbar Hyperextension and rotation test: evaluation deferred today       Patrick's Maneuver: evaluation deferred today                    Gait & Posture Assessment  Ambulation: Unassisted Gait: Relatively normal for age and body habitus Posture: WNL   Lower Extremity Exam    Side: Right lower extremity  Side: Left lower extremity  Inspection: No masses, redness, swelling, or asymmetry. No contractures  Inspection: No masses, redness, swelling, or asymmetry. No contractures  Functional ROM: Unrestricted ROM          Functional ROM:  Unrestricted ROM          Muscle strength & Tone: Functionally intact  Muscle strength & Tone: Functionally intact  Sensory: Unimpaired  Sensory: Unimpaired  Palpation: No palpable anomalies  Palpation: No palpable anomalies   Assessment  Primary Diagnosis & Pertinent Problem List: Diagnoses of Long term current use of opiate analgesic, Long term prescription opiate use, Opiate use, and Chronic pain syndrome were pertinent to this visit.  Visit Diagnosis: 1. Long term current use of opiate analgesic   2. Long term prescription opiate use   3. Opiate use   4. Chronic pain syndrome    Plan of Care  Initial treatment plan:  Please be advised that as per protocol, today's visit has been an evaluation only. We have not taken over the patient's controlled substance management.  Problem-specific plan: No problem-specific Assessment & Plan notes found for this encounter.  Ordered Lab-work, Procedure(s), Referral(s), & Consult(s): No orders of the defined types were placed in this encounter.  Pharmacotherapy: Medications ordered:  No orders of the defined types were placed in this encounter.  Medications administered during this visit: Ms. Vossler had no medications administered during this visit.   Pharmacotherapy under consideration:  Opioid Analgesics: The patient was informed that there is no guarantee that she would be a candidate for opioid analgesics. The decision will be made following CDC guidelines. This decision will be based on the results of diagnostic studies, as well as Ms. Mcquaid's risk profile.  Membrane stabilizer: To be determined at a later time Muscle relaxant: To be determined at a later time NSAID: To be determined at a later time Other analgesic(s): To be determined at a later time   Interventional therapies under consideration: Ms. Brester was informed that there is no guarantee that she would be a candidate for interventional therapies. The decision will be based on  the results of diagnostic studies, as well as Ms. Loncar's risk profile.  Possible procedure(s): ***   Provider-requested follow-up: No Follow-up on file.  Future Appointments Date Time Provider [place]  10/24/2016 1:00 PM Vevelyn Francois, NP [facility] None    Primary Care Physician: Marguerita Merles, MD Location: [facility] Note by:  Date: 10/24/2016; Time: 11:16 AM  Pain Score Disclaimer: We use  the NRS-11 scale. This is a self-reported, subjective measurement of pain severity with only modest accuracy. It is used primarily to identify changes within a particular patient. It must be understood that outpatient pain scales are significantly less accurate that those used for research, where they can be applied under ideal controlled circumstances with minimal exposure to variables. In reality, the score is likely to be a combination of pain intensity and pain affect, where pain affect describes the degree of emotional arousal or changes in action readiness caused by the sensory experience of pain. Factors such as social and work situation, setting, emotional state, anxiety levels, expectation, and prior pain experience may influence pain perception and show large inter-individual differences that may also be affected by time variables.  Patient instructions provided during this appointment: There are no Patient Instructions on file for this visit.

## 2016-11-27 ENCOUNTER — Ambulatory Visit: Payer: Medicaid Other | Attending: Pain Medicine | Admitting: Pain Medicine

## 2016-11-27 ENCOUNTER — Encounter: Payer: Self-pay | Admitting: Pain Medicine

## 2016-11-27 VITALS — BP 130/94 | HR 85 | Temp 98.2°F | Resp 16 | Ht 62.0 in | Wt 170.0 lb

## 2016-11-27 DIAGNOSIS — M25561 Pain in right knee: Secondary | ICD-10-CM | POA: Insufficient documentation

## 2016-11-27 DIAGNOSIS — R32 Unspecified urinary incontinence: Secondary | ICD-10-CM | POA: Diagnosis not present

## 2016-11-27 DIAGNOSIS — G894 Chronic pain syndrome: Secondary | ICD-10-CM

## 2016-11-27 DIAGNOSIS — G8929 Other chronic pain: Secondary | ICD-10-CM | POA: Diagnosis not present

## 2016-11-27 DIAGNOSIS — M069 Rheumatoid arthritis, unspecified: Secondary | ICD-10-CM | POA: Insufficient documentation

## 2016-11-27 DIAGNOSIS — F1190 Opioid use, unspecified, uncomplicated: Secondary | ICD-10-CM | POA: Diagnosis not present

## 2016-11-27 DIAGNOSIS — M25511 Pain in right shoulder: Secondary | ICD-10-CM | POA: Diagnosis not present

## 2016-11-27 DIAGNOSIS — M79604 Pain in right leg: Secondary | ICD-10-CM

## 2016-11-27 DIAGNOSIS — E079 Disorder of thyroid, unspecified: Secondary | ICD-10-CM | POA: Insufficient documentation

## 2016-11-27 DIAGNOSIS — Z79891 Long term (current) use of opiate analgesic: Secondary | ICD-10-CM | POA: Diagnosis not present

## 2016-11-27 DIAGNOSIS — M5441 Lumbago with sciatica, right side: Secondary | ICD-10-CM | POA: Diagnosis not present

## 2016-11-27 DIAGNOSIS — M47816 Spondylosis without myelopathy or radiculopathy, lumbar region: Secondary | ICD-10-CM | POA: Insufficient documentation

## 2016-11-27 DIAGNOSIS — M79671 Pain in right foot: Secondary | ICD-10-CM | POA: Diagnosis not present

## 2016-11-27 DIAGNOSIS — G501 Atypical facial pain: Secondary | ICD-10-CM | POA: Insufficient documentation

## 2016-11-27 DIAGNOSIS — L4050 Arthropathic psoriasis, unspecified: Secondary | ICD-10-CM | POA: Diagnosis not present

## 2016-11-27 DIAGNOSIS — M545 Low back pain: Secondary | ICD-10-CM | POA: Diagnosis not present

## 2016-11-27 DIAGNOSIS — Z87442 Personal history of urinary calculi: Secondary | ICD-10-CM | POA: Diagnosis not present

## 2016-11-27 DIAGNOSIS — F419 Anxiety disorder, unspecified: Secondary | ICD-10-CM | POA: Diagnosis not present

## 2016-11-27 DIAGNOSIS — F319 Bipolar disorder, unspecified: Secondary | ICD-10-CM | POA: Insufficient documentation

## 2016-11-27 DIAGNOSIS — K219 Gastro-esophageal reflux disease without esophagitis: Secondary | ICD-10-CM | POA: Insufficient documentation

## 2016-11-27 DIAGNOSIS — L732 Hidradenitis suppurativa: Secondary | ICD-10-CM | POA: Insufficient documentation

## 2016-11-27 DIAGNOSIS — M26609 Unspecified temporomandibular joint disorder, unspecified side: Secondary | ICD-10-CM | POA: Diagnosis not present

## 2016-11-27 DIAGNOSIS — G43909 Migraine, unspecified, not intractable, without status migrainosus: Secondary | ICD-10-CM | POA: Diagnosis not present

## 2016-11-27 DIAGNOSIS — Z8739 Personal history of other diseases of the musculoskeletal system and connective tissue: Secondary | ICD-10-CM | POA: Insufficient documentation

## 2016-11-27 DIAGNOSIS — M5416 Radiculopathy, lumbar region: Secondary | ICD-10-CM | POA: Diagnosis not present

## 2016-11-27 DIAGNOSIS — M25512 Pain in left shoulder: Secondary | ICD-10-CM

## 2016-11-27 DIAGNOSIS — F17210 Nicotine dependence, cigarettes, uncomplicated: Secondary | ICD-10-CM | POA: Diagnosis not present

## 2016-11-27 DIAGNOSIS — Z79899 Other long term (current) drug therapy: Secondary | ICD-10-CM

## 2016-11-27 DIAGNOSIS — M542 Cervicalgia: Secondary | ICD-10-CM | POA: Diagnosis not present

## 2016-11-27 DIAGNOSIS — M4696 Unspecified inflammatory spondylopathy, lumbar region: Secondary | ICD-10-CM

## 2016-11-27 DIAGNOSIS — M25551 Pain in right hip: Secondary | ICD-10-CM

## 2016-11-27 DIAGNOSIS — M47896 Other spondylosis, lumbar region: Secondary | ICD-10-CM

## 2016-11-27 DIAGNOSIS — M533 Sacrococcygeal disorders, not elsewhere classified: Secondary | ICD-10-CM | POA: Diagnosis not present

## 2016-11-27 NOTE — Progress Notes (Signed)
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 Atypical facial pain (Bilateral) (R>L) 11/27/2016  . History of TMJ syndrome 11/27/2016  . 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 Chronic sacroiliac joint pain (Bilateral) (L>R) 11/27/2016  . Lumbar facet hypertrophy (Multilevel) (Bilateral) 11/27/2016   Resolved Ambulatory Problems    Diagnosis Date Noted  . No Resolved Ambulatory Problems   Past Medical History:  Diagnosis Date  . Anxiety   . Arthritis   . Depression   . Headache   . Ulcer    Constitutional Exam  General appearance: Well nourished, well developed, and well hydrated. In no apparent acute distress Vitals:   11/27/16 1149  BP: (!) 130/94  Pulse: 85  Resp: 16  Temp: 98.2 F (36.8 C)  SpO2: 100%  Weight: 170 lb (77.1 kg)  Height: '5\' 2"'  (1.575 m)   BMI Assessment: Estimated body mass index is 31.09 kg/m as calculated from the following:   Height as of this encounter: '5\' 2"'  (1.575 m).   Weight as of this  encounter: 170 lb (77.1 kg).  BMI interpretation table: BMI level Category Range association with higher incidence of chronic pain  <18 kg/m2 Underweight   18.5-24.9 kg/m2 Ideal body weight   25-29.9 kg/m2 Overweight Increased incidence by 20%  30-34.9 kg/m2 Obese (Class I) Increased incidence by 68%  35-39.9 kg/m2 Severe obesity (Class II) Increased incidence by 136%  >40 kg/m2 Extreme obesity (Class III) Increased incidence by 254%   BMI Readings from Last 4 Encounters:  11/27/16 31.09 kg/m  05/19/16 32.01 kg/m  11/15/15 32.42 kg/m   Wt Readings from Last 4 Encounters:  11/27/16 170 lb (77.1 kg)  05/19/16 175 lb (79.4 kg)  11/15/15 183 lb (83 kg)  09/05/14 165 lb (74.8 kg)  Psych/Mental status: Alert, oriented x 3 (person, place, & time)       Eyes: PERLA Respiratory: No evidence of acute respiratory distress  Cervical Spine Area Exam  Skin & Axial Inspection: No masses, redness, edema, swelling, or associated skin lesions Alignment: Symmetrical Functional ROM: Unrestricted ROM      Stability: No instability detected Muscle Tone/Strength: Functionally intact. No obvious neuro-muscular anomalies detected. Sensory (Neurological): Unimpaired Palpation: No palpable anomalies              Upper Extremity (UE) Exam    Side: Right upper extremity  Side: Left upper extremity  Skin & Extremity Inspection: Skin color, temperature, and hair growth are WNL. No peripheral edema or cyanosis. No masses, redness, swelling, asymmetry, or associated skin lesions. No contractures.  Skin & Extremity Inspection: Skin color, temperature, and hair growth are WNL. No peripheral edema or cyanosis. No masses, redness, swelling, asymmetry, or associated skin lesions. No contractures.  Functional ROM: Unrestricted ROM          Functional ROM: Unrestricted ROM          Muscle Tone/Strength: Functionally intact. No obvious neuro-muscular anomalies detected.  Muscle Tone/Strength: Functionally intact. No  obvious neuro-muscular anomalies detected.  Sensory (Neurological): Unimpaired  Sensory (Neurological): Unimpaired  Palpation: No palpable anomalies  Palpation: No palpable anomalies              Specialized Test(s): Deferred         Specialized Test(s): Deferred          Thoracic Spine Area Exam  Skin & Axial Inspection: No masses, redness, or swelling Alignment: Symmetrical Functional ROM: Unrestricted ROM Stability: No instability detected Muscle Tone/Strength: Functionally intact. No obvious neuro-muscular anomalies detected. Sensory (Neurological): Unimpaired Muscle strength & Tone: No palpable anomalies  Lumbar Spine Area Exam  Skin & Axial Inspection: No masses, redness, or swelling Alignment: Symmetrical Functional ROM: Limited ROM      Stability: No instability detected Muscle Tone/Strength: Functionally intact. No obvious neuro-muscular anomalies detected. Sensory (Neurological): Movement-associated pain Palpation: Complains of area being tender to palpation       Provocative Tests: Lumbar Hyperextension and rotation test: Positive bilaterally for facet joint pain. Lumbar Lateral bending test: evaluation deferred today       Patrick's Maneuver: Positive for bilateral S-I arthralgia              Gait & Posture Assessment  Ambulation: Unassisted Gait: Relatively normal for age and body habitus Posture: WNL   Lower Extremity Exam    Side: Right lower extremity  Side: Left lower extremity  Skin & Extremity Inspection: Skin color, temperature, and hair growth are WNL. No peripheral edema or cyanosis. No masses, redness, swelling, asymmetry, or associated skin lesions. No contractures.  Skin & Extremity Inspection: Skin color, temperature, and hair growth are WNL. No peripheral edema or cyanosis. No masses, redness, swelling, asymmetry, or associated skin lesions. No contractures.  Functional ROM: Unrestricted ROM          Functional ROM: Unrestricted ROM           Muscle Tone/Strength: Able to Toe-walk & Heel-walk without problems  Muscle Tone/Strength: Able to Toe-walk & Heel-walk without problems  Sensory (Neurological): Unimpaired  Sensory (Neurological): Unimpaired  Palpation: No palpable anomalies  Palpation: No palpable anomalies   Assessment  Primary Diagnosis & Pertinent Problem List: The primary encounter diagnosis was Chronic low back pain (Primary Area of Pain) (Right). Diagnoses of Lumbar facet hypertrophy (Multilevel), Lumbar facet joint syndrome (Bilateral) (R>L), Chronic sacroiliac joint pain (Bilateral) (L>R), Chronic lower extremity pain (Secondary Area of pain) (Right), Chronic hip pain (Right), Chronic knee pain (Right), Chronic foot pain (Right), Chronic shoulder pain (Bilateral) (R>L), Chronic neck pain (Right), Atypical facial pain (Bilateral) (R>L), Psoriatic arthritis ([place]), History of TMJ syndrome, Chronic pain syndrome, Long term current use of opiate analgesic, Long term prescription opiate use, Long term prescription benzodiazepine use, and Opiate use were also pertinent to this visit.  Visit Diagnosis (New problems to examiner): 1. Chronic low back pain (Primary Area of Pain) (Right)   2. Lumbar facet hypertrophy (Multilevel)   3. Lumbar facet joint syndrome (Bilateral) (R>L)   4. Chronic sacroiliac joint pain (Bilateral) (L>R)   5. Chronic lower extremity pain (Secondary Area of pain) (Right)   6. Chronic hip pain (Right)   7. Chronic knee pain (Right)   8. Chronic foot pain (Right)   9. Chronic shoulder pain (Bilateral) (R>L)   10. Chronic neck pain (Right)   11. 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 Psoriatic arthritis ([place])   13. History of TMJ syndrome   14. Chronic pain syndrome   15. Long term current use of opiate analgesic   16. Long term prescription opiate use   17. Long term prescription benzodiazepine use  18. Opiate use    Plan of Care (Initial workup plan)  Note: Ms. Alleyne was reminded that as  per protocol, today's visit has been an evaluation only. We have not taken over the patient's controlled substance management.  Problem-specific plan: No problem-specific Assessment & Plan notes found for this encounter.  Orders:  Lab Orders Placed     Compliance Drug Analysis, Ur     Comprehensive metabolic panel     C-reactive protein     Sedimentation rate     Magnesium     25-Hydroxyvitamin D Lcms D2+D3     Vitamin B12  Imaging Orders     DG Cervical Spine Complete     DG HIP UNILAT W OR W/O PELVIS 2-3 VIEWS RIGHT     DG Knee 1-2 Views Right     DG Shoulder Left     DG Shoulder Right     DG Foot Complete Right     DG Lumbar Spine Complete W/Bend  Referral Orders     Ambulatory referral to Psychology Procedure Orders    No procedure(s) ordered today   Pharmacotherapy (current): Medications ordered:  No orders of the defined types were placed in this encounter.  Medications administered during this visit: Ms. Mcquown had no medications administered during this visit.   Pharmacological management options:  Opioid Analgesics: The patient was informed that there is no guarantee that she would be a candidate for opioid analgesics. The decision will be made following CDC guidelines. This decision will be based on the results of diagnostic studies, as well as Ms. Dayton's risk profile.   Membrane stabilizer: To be determined at a later time  Muscle relaxant: To be determined at a later time  NSAID: To be determined at a later time  Other analgesic(s): To be determined at a later time   Interventional management options: Ms. Lose was informed that there is no guarantee that she would be a candidate for interventional therapies. The decision will be based on the results of diagnostic studies, as well as Ms. Towner's risk profile.  Procedure(s) under consideration:  Diagnostic right-sided lumbar facet block  Possible right sided lumbar facet RFA  Diagnostic bilateral sacroiliac  joint block  Possible bilateral sacroiliac joint RFA  Diagnostic right-sided lumbar epidural steroid injection  Diagnostic right intra-articular hip joint injection  Diagnostic right femoral nerve and obturator nerve block  Possible right femoral nerve and obturator nerve RFA  Diagnostic right intra-articular knee joint injection  Possible series of 5 right-sided intra-articular Hyalgan knee injections  Diagnostic right Genicular nerve block  Possible right-sided genicular nerve RFA  Diagnostic bilateral intra-articular shoulder joint injection  Diagnostic bilateral suprascapular nerve block  Possible bilateral suprascapular nerve RFA  Diagnostic right-sided cervical facet block  Possible right-sided cervical facet RFA  Diagnostic bilateral temporomandibular joint injection    Provider-requested follow-up: Return for 2nd Visit with Dr. Dossie Arbour after [facility] eval.  No future appointments.  Primary Care Physician: Marguerita Merles, MD Location: [facility] Note by: Gaspar Cola, MD Date: 11/27/2016; Time: 12:57 PM  Patient Instructions    ____________________________________________________________________________________________  Appointment Policy Summary  It is our goal and responsibility to provide the medical community with assistance in the evaluation and management of patients with chronic pain. Unfortunately our resources are limited. Because we do not have an unlimited amount of time, or available appointments, we are required to closely monitor and manage their use. The following rules exist to maximize their use:  Patient's responsibilities: 1. Punctuality:  At what time should I arrive? You should be physically present in our office 30 minutes before your scheduled appointment. Your scheduled appointment is with your assigned healthcare provider. However, it takes 5-10 minutes to be "checked-in", and another 15 minutes for the nurses to  do the admission. If you arrive to our office at the time you were given for your appointment, you will end up being at least 20-25 minutes late to your appointment with the provider. 2. Tardiness:  What happens if I arrive only a few minutes after my scheduled appointment time? You will need to reschedule your appointment. The cutoff is your appointment time. This is why it is so important that you arrive at least 30 minutes before that appointment. If you have an appointment scheduled for 10:00 AM and you arrive at 10:01, you will be required to reschedule your appointment.  3. Plan ahead:  Always assume that you will encounter traffic on your way in. Plan for it. If you are dependent on a driver, make sure they understand these rules and the need to arrive early. 4. Other appointments and responsibilities:  Avoid scheduling any other appointments before or after your pain clinic appointments.  5. Be prepared:  Write down everything that you need to discuss with your healthcare provider and give this information to the admitting nurse. Write down the medications that you will need refilled. Bring your pills and bottles (even the empty ones), to all of your appointments, except for those where a procedure is scheduled. 6. No children or pets:  Find someone to take care of them. It is not appropriate to bring them in. 7. Scheduling changes:  We request "advanced notification" of any changes or cancellations. 8. Advanced notification:  Defined as a time period of more than 24 hours prior to the originally scheduled appointment. This allows for the appointment to be offered to other patients. 9. Rescheduling:  When a visit is rescheduled, it will require the cancellation of the original appointment. For this reason they both fall within the category of "Cancellations".  10. Cancellations:  They require advanced notification. Any cancellation less than 24 hours before the  appointment will be recorded  as a "No Show". 11. No Show:  Defined as an unkept appointment where the patient failed to notify or declare to the practice their intention or inability to keep the appointment.  Corrective process for repeat offenders:  1. Tardiness: Three (3) episodes of rescheduling due to late arrivals will be recorded as one (1) "No Show". 2. Cancellation or reschedule: Three (3) cancellations or rescheduling will be recorded as one (1) "No Show". 3. "No Shows": Three (3) "No Shows" within a 12 month period will result in discharge from the practice.  ____________________________________________________________________________________________  ____________________________________________________________________________________________  Pain Scale  Introduction: The pain score used by this practice is the Verbal Numerical Rating Scale (VNRS-11). This is an 11-point scale. It is for adults and children 10 years or older. There are significant differences in how the pain score is reported, used, and applied. Forget everything you learned in the past and learn this scoring system.  General Information: The scale should reflect your current level of pain. Unless you are specifically asked for the level of your worst pain, or your average pain. If you are asked for one of these two, then it should be understood that it is over the past 24 hours.  Basic Activities of Daily Living (ADL): Personal hygiene, dressing, eating,  transferring, and using restroom.  Instructions: Most patients tend to report their level of pain as a combination of two factors, their physical pain and their psychosocial pain. This last one is also known as "suffering" and it is reflection of how physical pain affects you socially and psychologically. From now on, report them separately. From this point on, when asked to report your pain level, report only your physical pain. Use the following table for reference.  Pain Clinic Pain Levels  (0-5/10)  Pain Level Score  Description  No Pain 0   Mild pain 1 Nagging, annoying, but does not interfere with basic activities of daily living (ADL). Patients are able to eat, bathe, get dressed, toileting (being able to get on and off the toilet and perform personal hygiene functions), transfer (move in and out of bed or a chair without assistance), and maintain continence (able to control bladder and bowel functions). Blood pressure and heart rate are unaffected. A normal heart rate for a healthy adult ranges from 60 to 100 bpm (beats per minute).   Mild to moderate pain 2 Noticeable and distracting. Impossible to hide from other people. More frequent flare-ups. Still possible to adapt and function close to normal. It can be very annoying and may have occasional stronger flare-ups. With discipline, patients may get used to it and adapt.   Moderate pain 3 Interferes significantly with activities of daily living (ADL). It becomes difficult to feed, bathe, get dressed, get on and off the toilet or to perform personal hygiene functions. Difficult to get in and out of bed or a chair without assistance. Very distracting. With effort, it can be ignored when deeply involved in activities.   Moderately severe pain 4 Impossible to ignore for more than a few minutes. With effort, patients may still be able to manage work or participate in some social activities. Very difficult to concentrate. Signs of autonomic nervous system discharge are evident: dilated pupils (mydriasis); mild sweating (diaphoresis); sleep interference. Heart rate becomes elevated (>115 bpm). Diastolic blood pressure (lower number) rises above 100 mmHg. Patients find relief in laying down and not moving.   Severe pain 5 Intense and extremely unpleasant. Associated with frowning face and frequent crying. Pain overwhelms the senses.  Ability to do any activity or maintain social relationships becomes significantly limited. Conversation  becomes difficult. Pacing back and forth is common, as getting into a comfortable position is nearly impossible. Pain wakes you up from deep sleep. Physical signs will be obvious: pupillary dilation; increased sweating; goosebumps; brisk reflexes; cold, clammy hands and feet; nausea, vomiting or dry heaves; loss of appetite; significant sleep disturbance with inability to fall asleep or to remain asleep. When persistent, significant weight loss is observed due to the complete loss of appetite and sleep deprivation.  Blood pressure and heart rate becomes significantly elevated. Caution: If elevated blood pressure triggers a pounding headache associated with blurred vision, then the patient should immediately seek attention at an urgent or emergency care unit, as these may be signs of an impending stroke.    Emergency Department Pain Levels (6-10/10)  Emergency Room Pain 6 Severely limiting. Requires emergency care and should not be seen or managed at an outpatient pain management facility. Communication becomes difficult and requires great effort. Assistance to reach the emergency department may be required. Facial flushing and profuse sweating along with potentially dangerous increases in heart rate and blood pressure will be evident.   Distressing pain 7 Self-care is very difficult. Assistance is required  to transport, or use restroom. Assistance to reach the emergency department will be required. Tasks requiring coordination, such as bathing and getting dressed become very difficult.   Disabling pain 8 Self-care is no longer possible. At this level, pain is disabling. The individual is unable to do even the most "basic" activities such as walking, eating, bathing, dressing, transferring to a bed, or toileting. Fine motor skills are lost. It is difficult to think clearly.   Incapacitating pain 9 Pain becomes incapacitating. Thought processing is no longer possible. Difficult to remember your own name.  Control of movement and coordination are lost.   The worst pain imaginable 10 At this level, most patients pass out from pain. When this level is reached, collapse of the autonomic nervous system occurs, leading to a sudden drop in blood pressure and heart rate. This in turn results in a temporary and dramatic drop in blood flow to the brain, leading to a loss of consciousness. Fainting is one of the body's self defense mechanisms. Passing out puts the brain in a calmed state and causes it to shut down for a while, in order to begin the healing process.    Summary: 1. Refer to this scale when providing [place] with your pain level. 2. Be accurate and careful when reporting your pain level. This will help with your care. 3. Over-reporting your pain level will lead to loss of credibility. 4. Even a level of 1/10 means that there is pain and will be treated at our facility. 5. High, inaccurate reporting will be documented as "Symptom Exaggeration", leading to loss of credibility and suspicions of possible secondary gains such as obtaining more narcotics, or wanting to appear disabled, for fraudulent reasons. 6. Only pain levels of 5 or below will be seen at our facility. 7. Pain levels of 6 and above will be sent to the Emergency Department and the appointment cancelled. ____________________________________________________________________________________________

## 2016-11-27 NOTE — Progress Notes (Signed)
Safety precautions to be maintained throughout the outpatient stay will include: orient to surroundings, keep bed in low position, maintain call bell within reach at all times, provide assistance with transfer out of bed and ambulation.  

## 2016-11-27 NOTE — Patient Instructions (Signed)

## 2016-12-01 LAB — COMPLIANCE DRUG ANALYSIS, UR

## 2016-12-03 LAB — COMPREHENSIVE METABOLIC PANEL
ALT: 8 IU/L (ref 0–32)
AST: 15 IU/L (ref 0–40)
Albumin/Globulin Ratio: 1.8 (ref 1.2–2.2)
Albumin: 3.5 g/dL (ref 3.5–5.5)
Alkaline Phosphatase: 78 IU/L (ref 39–117)
BUN/Creatinine Ratio: 17 (ref 9–23)
BUN: 13 mg/dL (ref 6–24)
Bilirubin Total: <0.2 mg/dL (ref 0.0–1.2)
CO2: 23 mmol/L (ref 20–29)
Calcium: 8.6 mg/dL — ABNORMAL LOW (ref 8.7–10.2)
Chloride: 105 mmol/L (ref 96–106)
Creatinine, Ser: 0.76 mg/dL (ref 0.57–1.00)
GFR calc Af Amer: 106 mL/min/{1.73_m2} (ref >59)
GFR calc non Af Amer: 92 mL/min/{1.73_m2} (ref >59)
Globulin, Total: 1.9 g/dL (ref 1.5–4.5)
Glucose: 100 mg/dL — ABNORMAL HIGH (ref 65–99)
Potassium: 4.4 mmol/L (ref 3.5–5.2)
Sodium: 141 mmol/L (ref 134–144)
Total Protein: 5.4 g/dL — ABNORMAL LOW (ref 6.0–8.5)

## 2016-12-03 LAB — 25-HYDROXY VITAMIN D LCMS D2+D3
25-Hydroxy, Vitamin D-2: <1 ng/mL
25-Hydroxy, Vitamin D-3: 23 ng/mL
25-Hydroxy, Vitamin D: 23 ng/mL — ABNORMAL LOW

## 2016-12-03 LAB — C-REACTIVE PROTEIN: CRP: 15.1 mg/L — ABNORMAL HIGH (ref 0.0–4.9)

## 2016-12-03 LAB — VITAMIN B12: Vitamin B-12: 569 pg/mL (ref 232–1245)

## 2016-12-03 LAB — MAGNESIUM: Magnesium: 1.9 mg/dL (ref 1.6–2.3)

## 2016-12-03 LAB — SEDIMENTATION RATE: Sed Rate: 2 mm/hr (ref 0–40)

## 2016-12-09 ENCOUNTER — Encounter: Payer: Self-pay | Admitting: Emergency Medicine

## 2016-12-09 ENCOUNTER — Emergency Department
Admission: EM | Admit: 2016-12-09 | Discharge: 2016-12-09 | Disposition: A | Discharge status: Home / Self Care | Payer: Medicaid Other | Attending: Emergency Medicine | Admitting: Emergency Medicine

## 2016-12-09 ENCOUNTER — Emergency Department: Payer: Medicaid Other

## 2016-12-09 DIAGNOSIS — Z79899 Other long term (current) drug therapy: Secondary | ICD-10-CM | POA: Diagnosis not present

## 2016-12-09 DIAGNOSIS — W108XXA Fall (on) (from) other stairs and steps, initial encounter: Secondary | ICD-10-CM | POA: Diagnosis not present

## 2016-12-09 DIAGNOSIS — Y999 Unspecified external cause status: Secondary | ICD-10-CM | POA: Diagnosis not present

## 2016-12-09 DIAGNOSIS — M5387 Other specified dorsopathies, lumbosacral region: Secondary | ICD-10-CM | POA: Diagnosis not present

## 2016-12-09 DIAGNOSIS — G894 Chronic pain syndrome: Secondary | ICD-10-CM | POA: Insufficient documentation

## 2016-12-09 DIAGNOSIS — Y929 Unspecified place or not applicable: Secondary | ICD-10-CM | POA: Diagnosis not present

## 2016-12-09 DIAGNOSIS — S39012A Strain of muscle, fascia and tendon of lower back, initial encounter: Secondary | ICD-10-CM | POA: Diagnosis not present

## 2016-12-09 DIAGNOSIS — F17210 Nicotine dependence, cigarettes, uncomplicated: Secondary | ICD-10-CM | POA: Diagnosis not present

## 2016-12-09 DIAGNOSIS — Y939 Activity, unspecified: Secondary | ICD-10-CM | POA: Insufficient documentation

## 2016-12-09 DIAGNOSIS — S3992XA Unspecified injury of lower back, initial encounter: Secondary | ICD-10-CM | POA: Diagnosis present

## 2016-12-09 MED ORDER — TRAMADOL HCL 50 MG PO TABS: 50.0000 mg | ORAL_TABLET | Freq: Four times a day (QID) | ORAL | 0 refills | Status: DC | PRN

## 2016-12-09 MED ORDER — CYCLOBENZAPRINE HCL 10 MG PO TABS: 10.0000 mg | ORAL_TABLET | Freq: Three times a day (TID) | ORAL | 0 refills | Status: DC | PRN

## 2016-12-09 MED ORDER — OXYCODONE HCL 5 MG PO TABS
5.0000 mg | ORAL_TABLET | Freq: Once | ORAL | Status: AC
  Administered 2016-12-09: 5 mg via ORAL
  Filled 2016-12-09: qty 1

## 2016-12-09 MED ORDER — ACETAMINOPHEN 500 MG PO TABS
1000.0000 mg | ORAL_TABLET | Freq: Once | ORAL | Status: AC
  Administered 2016-12-09: 1000 mg via ORAL
  Filled 2016-12-09: qty 2

## 2016-12-09 MED ORDER — CYCLOBENZAPRINE HCL 10 MG PO TABS
10.0000 mg | ORAL_TABLET | Freq: Once | ORAL | Status: AC
  Administered 2016-12-09: 10 mg via ORAL
  Filled 2016-12-09: qty 1

## 2016-12-09 MED ORDER — KETOROLAC TROMETHAMINE 30 MG/ML IJ SOLN
30.0000 mg | Freq: Once | INTRAMUSCULAR | Status: AC
  Administered 2016-12-09: 30 mg via INTRAMUSCULAR
  Filled 2016-12-09: qty 1

## 2016-12-09 MED ORDER — LIDOCAINE 5 % EX PTCH
1.0000 | MEDICATED_PATCH | CUTANEOUS | Status: DC
  Administered 2016-12-09: 1 via TRANSDERMAL
  Filled 2016-12-09: qty 1

## 2016-12-09 NOTE — ED Notes (Signed)
Pt provided with phone for ride.

## 2016-12-09 NOTE — Discharge Instructions (Signed)
You have been seen in the Emergency Department (ED)  today for back pain.  Back pain has many possible causes some are related to muscles while others have more serious causes. Even though you were checked carefully today and your exam and evaluation were reassuring, problems may develop later or continue to unfold. Therefore it is imperative that you follow up with doctor closely for further evaluation.  Follow-up with your doctor in 1 day for further evaluation.  Pain control: Take tylenol 1000mg  every 8 hours. Take 50mg  of tramadol every 6 hours for breakthrough pain. If you need the tramadol make sure to take one senokot as well to prevent constipation.  Do not drink alcohol, drive or participate in any other potentially dangerous activities while taking this medication as it may make you sleepy. Do not take this medication with any other sedating medications, either prescription or over-the-counter.   When should you call for help?  Call your doctor now or seek immediate medical care if:  You have new or worsening numbness in your legs.  You have new or worsening weakness in your legs. (This could make it hard to stand up.)  You lose control of your bladder or bowels or if you are unable to urinate. You have numbness of your groin or buttock region If you develop a fever  Watch closely for changes in your health, and be sure to contact your doctor if:  Your pain gets worse.  You are not getting better after 2 weeks.  How can you care for yourself at home?  Take pain medicines exactly as directed.  If the doctor gave you a prescription medicine for pain, take it as prescribed.  If you are not taking a prescription pain medicine, ask your doctor if you can take an over-the-counter medicine like tylenol or ibuprofen. Sit or lie in positions that are most comfortable and reduce your pain. Try one of these positions when you lie down:  Lie on your back with your knees bent and supported by  large pillows.  Lie on the floor with your legs on the seat of a sofa or chair.  Lie on your side with your knees and hips bent and a pillow between your legs.  Lie on your stomach if it does not make pain worse. Do not sit up in bed, and avoid soft couches and twisted positions. Bed rest can help relieve pain at first, but it delays healing. Avoid bed rest after the first day of back pain.  Change positions every 30 minutes. If you must sit for long periods of time, take breaks from sitting. Get up and walk around, or lie in a comfortable position.  Try using a heating pad on a low or medium setting for 15 to 20 minutes every 2 or 3 hours. Try a warm shower in place of one session with the heating pad.  You can also try an ice pack for 10 to 15 minutes every 2 to 3 hours. Put a thin cloth between the ice pack and your skin.  Take short walks several times a day. You can start with 5 to 10 minutes, 3 or 4 times a day, and work up to longer walks. Walk on level surfaces and avoid hills and stairs until your back is better.  Return to work and other activities as soon as you can. Continued rest without activity is usually not good for your back.  To prevent future back pain, do exercises to stretch  and strengthen your back and stomach. Learn how to use good posture, safe lifting techniques, and proper body mechanics.

## 2016-12-09 NOTE — ED Notes (Signed)
Pt states she is not able to ambulate. Pt declines to take any steps, md notified. Pt had to be awoken from sleep to attempt ambulation.

## 2016-12-09 NOTE — ED Notes (Signed)
Patient transported to X-ray 

## 2016-12-09 NOTE — ED Notes (Signed)
Pt informed she cannot drive for [id number] after oxycodone administration. Pt verbalizes understanding and states she will have someone driver her home.

## 2016-12-09 NOTE — ED Notes (Signed)
Pt states she fell down 3 steps landing on buttocks. Pt complains of low back pain. Pt with cms intact in all extremities. No obvious deformity noted.

## 2016-12-09 NOTE — ED Notes (Signed)
Pt given wheelchair, this rn told pt to have a seat in wheelchair after using toilet in room and this rn would wheel pt out after signing discharge instructions. Per Eliezer Lofts, rn pt walked out of ed without notifying this rn using wheelchair like a walker. Pt did not sign e signature pad for discharge instructions.

## 2016-12-09 NOTE — ED Triage Notes (Signed)
Pt reports she fell coming down steps, landing on buttocks, on Friday and since has had lower back pain. Pt sts she took a Flexeril at home without relief.

## 2016-12-09 NOTE — ED Provider Notes (Addendum)
[facility] Emergency Department Provider Note  ____________________________________________  Time seen: Approximately 4:05 AM  I have reviewed the triage vital signs and the nursing notes.   HISTORY  Chief Complaint Back Pain   HPI [name] is a 51 y.o. female with a history of chronic back pain who presents for evaluation of exacerbation of her pain. Patient reports that she was coming down the stairs when she missed a step and fell onto her buttock. This happened today at 4:30 PM. Since then she has had progressively worsening pain located in the left lower back radiating down her right leg. Pain is currently severe, worse with movement and ambulation. She has been able to walk. No saddle anesthesia, lower extremity weakness or numbness, urinary or bowel incontinence or retention. Patient describes that the pain is identical to her chronic pain just more severe in intensity.Patient denies head trauma, LOC, neck pain, extremity pain, chest pain, abdominal pain.  Past Medical History:  Diagnosis Date  . Anxiety   . Arthritis    PSORIATIC ARTHRITIS  . Depression   . Headache    MIGRAINE  . Ulcer     Patient Active Problem List   Diagnosis Date Noted  . Long term prescription benzodiazepine use 11/27/2016  . Chronic low back pain (Primary Area of Pain) (Right) 11/27/2016  . Chronic lower extremity pain (Secondary Area of pain) (Right) 11/27/2016  . Chronic hip pain [facility] Area of Pain) (Right) 11/27/2016  . Chronic knee pain (Fourth Area of Pain) (Right) 11/27/2016  . Chronic foot pain (Fifth Area of Pain) (Right) 11/27/2016  . Chronic shoulder pain (Bilateral) (R>L) 11/27/2016  . Chronic neck pain (Right) 11/27/2016  . Atypical facial pain (Bilateral) (R>L) 11/27/2016  . History of TMJ syndrome 11/27/2016  . 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 Chronic sacroiliac joint pain (Bilateral) (L>R) 11/27/2016  . Lumbar facet  hypertrophy (Multilevel) (Bilateral) 11/27/2016  . Long term current use of opiate analgesic 10/23/2016  . Long term prescription opiate use 10/23/2016  . Opiate use 10/23/2016  . Chronic pain syndrome 10/23/2016  . Cystocele 12/20/2012  . Dyssynergia 12/20/2012  . Fecal incontinence 12/20/2012  . Mixed incontinence 12/20/2012  . Rectocele 12/20/2012  . Psoriasis 08/16/2012  . Psoriatic arthritis ([place]) 08/16/2012  . 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 GERD (gastroesophageal reflux disease) 05/24/2012  . Hidradenitis 05/24/2012  . Migraine 05/24/2012    Past Surgical History:  Procedure Laterality Date  . BREAST REDUCTION SURGERY    . COLONOSCOPY WITH PROPOFOL N/A 05/19/2016   Procedure: COLONOSCOPY WITH PROPOFOL;  Surgeon: Lollie Sails, MD;  Location: [facility] ENDOSCOPY;  Service: Endoscopy;  Laterality: N/A;  . foot spurs    . FRACTURE SURGERY    . MANDIBLE FRACTURE SURGERY    . TUBAL LIGATION      Prior to Admission medications   Medication Sig Start Date End Date Taking? Authorizing Provider  ALPRAZolam Duanne Moron) 1 MG tablet Take 1 mg by mouth 4 (four) times daily.    [provider]  amphetamine-dextroamphetamine (ADDERALL) 15 MG tablet Take 15 mg by mouth 2 (two) times daily.    [provider]  buPROPion (WELLBUTRIN XL) 300 MG 24 hr tablet Take 300 mg by mouth daily.    [provider]  clotrimazole-betamethasone (LOTRISONE) cream APP TO AFFECTED EAR ONCE OR TWICE WEEKLY PRN FOR SKIN IRRITATION. 11/09/16   [provider]  cyclobenzaprine (FLEXERIL) 10 MG tablet Take 1 tablet (10  mg total) by mouth 3 (three) times daily as needed for muscle spasms. 12/09/16   Rudene Re, MD  escitalopram (LEXAPRO) 20 MG tablet Take 30 mg by mouth daily.     [provider]  omeprazole (PRILOSEC) 20 MG capsule Take 20 mg by mouth 2 (two) times daily before a meal.    [provider]  traMADol (ULTRAM) 50 MG tablet  Take 1 tablet (50 mg total) by mouth every 6 (six) hours as needed. 12/09/16 12/09/17  Rudene Re, MD    Allergies Topiramate  Family History  Problem Relation Age of Onset  . Liver cancer Mother   . Cancer Father     Social History Social History  Substance Use Topics  . Smoking status: Current Some Day Smoker    Types: Cigarettes  . Smokeless tobacco: Never Used  . Alcohol use No    Review of Systems  Constitutional: Negative for fever. Eyes: Negative for visual changes. ENT: Negative for sore throat. Neck: No neck pain  Cardiovascular: Negative for chest pain. Respiratory: Negative for shortness of breath. Gastrointestinal: Negative for abdominal pain, vomiting or diarrhea. Genitourinary: Negative for dysuria. Musculoskeletal: + back pain. Skin: Negative for rash. Neurological: Negative for headaches, weakness or numbness. Psych: No SI or HI  ____________________________________________   PHYSICAL EXAM:  VITAL SIGNS: ED Triage Vitals [12/09/16 0132]  Enc Vitals Group     BP 116/64     Pulse Rate 69     Resp 17     Temp 98 F (36.7 C)     Temp Source Oral     SpO2 99 %     Weight 170 lb (77.1 kg)     Height      Head Circumference      Peak Flow      Pain Score      Pain Loc      Pain Edu?      Excl. in [place]?     Constitutional: Alert and oriented. Well appearing and in no apparent distress. HEENT:      Head: Normocephalic and atraumatic.         Eyes: Conjunctivae are normal. Sclera is non-icteric.       Mouth/Throat: Mucous membranes are moist.       Neck: Supple with no signs of meningismus. Cardiovascular: Regular rate and rhythm. No murmurs, gallops, or rubs. 2+ symmetrical distal pulses are present in all extremities. No JVD. Respiratory: Normal respiratory effort. Lungs are clear to auscultation bilaterally. No wheezes, crackles, or rhonchi.  Gastrointestinal: Soft, non tender, and non distended with positive bowel sounds. No rebound  or guarding. Musculoskeletal: No CT and L-spine midline tenderness, diffuse left lumbar ttp. Nontender with normal range of motion in all extremities. No edema, cyanosis, or erythema of extremities. Neurologic: Normal speech and language. Face is symmetric. Moving all extremities. No gross focal neurologic deficits are appreciated. DTRs 2+ on b/l LE Skin: Skin is warm, dry and intact. No rash noted. Psychiatric: Mood and affect are normal. Speech and behavior are normal.  ____________________________________________   LABS (all labs ordered are listed, but only abnormal results are displayed)  Labs Reviewed - No data to display ____________________________________________  EKG  none  ____________________________________________  RADIOLOGY  XR lumbar spine: negative  XR pelvis:  negative ____________________________________________   PROCEDURES  Procedure(s) performed: None Procedures Critical Care performed:  None ____________________________________________   INITIAL IMPRESSION / ASSESSMENT AND PLAN / ED COURSE  51 y.o. female with a history of  chronic back pain who presents for evaluation of exacerbation of her low back pain since falling earlier today. Patient has tenderness to palpation over the left lumbar region and over the L iliac crest, no c/t/l spine ttp. Normal strength and DTRs. Will get XR and treat pain with tylenol and oxycodone.    _________________________ 6:32 AM on 12/09/2016 -----------------------------------------  X-rays with no acute findings. Patient sleeping comfortably. Able to move and ambulate. Pain is markedly improved. We'll discharge home on Flexeril, tramadol, Tylenol, and close follow-up with primary care doctor. Discussed return precautions with patient. [place] controlled substance database was reviewed with no opiate prescriptions filled over the course of the last 6 months  Pertinent labs & imaging results that were available during my  care of the patient were reviewed by me and considered in my medical decision making (see chart for details).    ____________________________________________   FINAL CLINICAL IMPRESSION(S) / ED DIAGNOSES  Final diagnoses:  Strain of lumbar region, initial encounter      NEW MEDICATIONS STARTED DURING THIS VISIT:  New Prescriptions   CYCLOBENZAPRINE (FLEXERIL) 10 MG TABLET    Take 1 tablet (10 mg total) by mouth 3 (three) times daily as needed for muscle spasms.   TRAMADOL (ULTRAM) 50 MG TABLET    Take 1 tablet (50 mg total) by mouth every 6 (six) hours as needed.     Note:  This document was prepared using Dragon voice recognition software and may include unintentional dictation errors.    Alfred Levins, [place], MD 12/09/16 [place], [place], [place] 12/09/16 [phone]

## 2016-12-16 ENCOUNTER — Emergency Department
Admission: EM | Admit: 2016-12-16 | Discharge: 2016-12-16 | Disposition: A | Discharge status: Home / Self Care | Payer: Medicaid Other | Attending: Emergency Medicine | Admitting: Emergency Medicine

## 2016-12-16 ENCOUNTER — Encounter: Payer: Self-pay | Admitting: Emergency Medicine

## 2016-12-16 DIAGNOSIS — F17210 Nicotine dependence, cigarettes, uncomplicated: Secondary | ICD-10-CM | POA: Insufficient documentation

## 2016-12-16 DIAGNOSIS — M545 Low back pain: Secondary | ICD-10-CM | POA: Insufficient documentation

## 2016-12-16 DIAGNOSIS — Z79899 Other long term (current) drug therapy: Secondary | ICD-10-CM | POA: Insufficient documentation

## 2016-12-16 DIAGNOSIS — F1410 Cocaine abuse, uncomplicated: Secondary | ICD-10-CM | POA: Insufficient documentation

## 2016-12-16 DIAGNOSIS — F329 Major depressive disorder, single episode, unspecified: Secondary | ICD-10-CM | POA: Diagnosis not present

## 2016-12-16 DIAGNOSIS — G894 Chronic pain syndrome: Secondary | ICD-10-CM | POA: Diagnosis not present

## 2016-12-16 DIAGNOSIS — T50904A Poisoning by unspecified drugs, medicaments and biological substances, undetermined, initial encounter: Secondary | ICD-10-CM | POA: Diagnosis present

## 2016-12-16 LAB — CBC WITH DIFFERENTIAL/PLATELET
Basophils Absolute: 0.1 10*3/uL (ref 0–0.1)
Basophils Relative: 1 %
Eosinophils Absolute: 0.7 10*3/uL (ref 0–0.7)
Eosinophils Relative: 6 %
HCT: 35.1 % (ref 35.0–47.0)
Hemoglobin: 11.8 g/dL — ABNORMAL LOW (ref 12.0–16.0)
Lymphocytes Relative: 22 %
Lymphs Abs: 2.5 10*3/uL (ref 1.0–3.6)
MCH: 31.1 pg (ref 26.0–34.0)
MCHC: 33.5 g/dL (ref 32.0–36.0)
MCV: 92.9 fL (ref 80.0–100.0)
Monocytes Absolute: 0.9 10*3/uL (ref 0.2–0.9)
Monocytes Relative: 8 %
Neutro Abs: 7.1 10*3/uL — ABNORMAL HIGH (ref 1.4–6.5)
Neutrophils Relative %: 63 %
Platelets: 353 10*3/uL (ref 150–440)
RBC: 3.78 MIL/uL — ABNORMAL LOW (ref 3.80–5.20)
RDW: 13.4 % (ref 11.5–14.5)
WBC: 11.3 10*3/uL — ABNORMAL HIGH (ref 3.6–11.0)

## 2016-12-16 LAB — URINE DRUG SCREEN, QUALITATIVE (ARMC ONLY)
Amphetamines, Ur Screen: POSITIVE — AB
Barbiturates, Ur Screen: NOT DETECTED
Benzodiazepine, Ur Scrn: POSITIVE — AB
Cannabinoid 50 Ng, Ur ~~LOC~~: POSITIVE — AB
Cocaine Metabolite,Ur ~~LOC~~: POSITIVE — AB
MDMA (Ecstasy)Ur Screen: NOT DETECTED
Methadone Scn, Ur: NOT DETECTED
Opiate, Ur Screen: NOT DETECTED
Phencyclidine (PCP) Ur S: NOT DETECTED
Tricyclic, Ur Screen: NOT DETECTED

## 2016-12-16 LAB — BASIC METABOLIC PANEL
Anion gap: 6 (ref 5–15)
BUN: 13 mg/dL (ref 6–20)
CO2: 27 mmol/L (ref 22–32)
Calcium: 8.7 mg/dL — ABNORMAL LOW (ref 8.9–10.3)
Chloride: 106 mmol/L (ref 101–111)
Creatinine, Ser: 0.61 mg/dL (ref 0.44–1.00)
GFR calc Af Amer: >60 mL/min (ref >60)
GFR calc non Af Amer: >60 mL/min (ref >60)
Glucose, Bld: 115 mg/dL — ABNORMAL HIGH (ref 65–99)
Potassium: 3.6 mmol/L (ref 3.5–5.1)
Sodium: 139 mmol/L (ref 135–145)

## 2016-12-16 LAB — PREGNANCY, URINE: Preg Test, Ur: NEGATIVE

## 2016-12-16 LAB — CK: Total CK: 484 U/L — ABNORMAL HIGH (ref 38–234)

## 2016-12-16 LAB — TROPONIN I: Troponin I: <0.03 ng/mL (ref <0.03)

## 2016-12-16 LAB — ETHANOL: Alcohol, Ethyl (B): <5 mg/dL (ref <5)

## 2016-12-16 NOTE — ED Provider Notes (Signed)
[facility] Emergency Department Provider Note   ____________________________________________   First MD Initiated Contact with Patient 12/16/16 [phone]     (approximate)  I have reviewed the triage vital signs and the nursing notes.   HISTORY  Chief Complaint Drug Overdose  Patient with some altered mental status and not participating in history  HPI [name] is a 51 y.o. female Who comes into the hospital today after taking too much of her medication. The patient's boyfriend think she overdosed on her medicine. The patient states that she took her medicine as prescribed. She reports that she does not know what she took and she told other nurses previously. She is complaining of back pain but denies trying to kill herself. The patient is falling asleep during the examand not participating the fullest extent. She falls asleep midway during sentences but again denies suicidal ideation. She did endorse to the nurse that she had taken some cocaine.   Past Medical History:  Diagnosis Date  . Anxiety   . Arthritis    PSORIATIC ARTHRITIS  . Depression   . Headache    MIGRAINE  . Ulcer     Patient Active Problem List   Diagnosis Date Noted  . Long term prescription benzodiazepine use 11/27/2016  . Chronic low back pain (Primary Area of Pain) (Right) 11/27/2016  . Chronic lower extremity pain (Secondary Area of pain) (Right) 11/27/2016  . Chronic hip pain [facility] Area of Pain) (Right) 11/27/2016  . Chronic knee pain (Fourth Area of Pain) (Right) 11/27/2016  . Chronic foot pain (Fifth Area of Pain) (Right) 11/27/2016  . Chronic shoulder pain (Bilateral) (R>L) 11/27/2016  . Chronic neck pain (Right) 11/27/2016  . Atypical facial pain (Bilateral) (R>L) 11/27/2016  . History of TMJ syndrome 11/27/2016  . 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 Chronic sacroiliac joint pain (Bilateral) (L>R) 11/27/2016  . Lumbar facet hypertrophy  (Multilevel) (Bilateral) 11/27/2016  . Long term current use of opiate analgesic 10/23/2016  . Long term prescription opiate use 10/23/2016  . Opiate use 10/23/2016  . Chronic pain syndrome 10/23/2016  . Cystocele 12/20/2012  . Dyssynergia 12/20/2012  . Fecal incontinence 12/20/2012  . Mixed incontinence 12/20/2012  . Rectocele 12/20/2012  . Psoriasis 08/16/2012  . Psoriatic arthritis ([place]) 08/16/2012  . 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 GERD (gastroesophageal reflux disease) 05/24/2012  . Hidradenitis 05/24/2012  . Migraine 05/24/2012    Past Surgical History:  Procedure Laterality Date  . BREAST REDUCTION SURGERY    . COLONOSCOPY WITH PROPOFOL N/A 05/19/2016   Procedure: COLONOSCOPY WITH PROPOFOL;  Surgeon: Lollie Sails, MD;  Location: [facility] ENDOSCOPY;  Service: Endoscopy;  Laterality: N/A;  . foot spurs    . FRACTURE SURGERY    . MANDIBLE FRACTURE SURGERY    . TUBAL LIGATION      Prior to Admission medications   Medication Sig Start Date End Date Taking? Authorizing Provider  ALPRAZolam Duanne Moron) 1 MG tablet Take 1 mg by mouth 4 (four) times daily.    [provider]  amphetamine-dextroamphetamine (ADDERALL) 20 MG tablet Take 20 mg by mouth 2 (two) times daily.     [provider]  buPROPion (WELLBUTRIN XL) 300 MG 24 hr tablet Take 300 mg by mouth daily.    [provider]  clotrimazole-betamethasone (LOTRISONE) cream APP TO AFFECTED EAR ONCE OR TWICE WEEKLY PRN FOR SKIN IRRITATION. 11/09/16   [provider]  cyclobenzaprine (FLEXERIL) 10 MG tablet Take 1  tablet (10 mg total) by mouth 3 (three) times daily as needed for muscle spasms. 12/09/16   Rudene Re, MD  escitalopram (LEXAPRO) 20 MG tablet Take 30 mg by mouth daily.     [provider]  omeprazole (PRILOSEC) 20 MG capsule Take 20 mg by mouth 2 (two) times daily before a meal.    [provider]  traMADol (ULTRAM) 50 MG tablet Take 1  tablet (50 mg total) by mouth every 6 (six) hours as needed. 12/09/16 12/09/17  Rudene Re, MD    Allergies Topiramate  Family History  Problem Relation Age of Onset  . Liver cancer Mother   . Cancer Father     Social History Social History  Substance Use Topics  . Smoking status: Current Some Day Smoker    Types: Cigarettes  . Smokeless tobacco: Never Used  . Alcohol use No    Review of Systems  Musculoskeletal:  back pain.  Patient otherwise does not participate in history nor does she answer questions.  ____________________________________________   PHYSICAL EXAM:  VITAL SIGNS: ED Triage Vitals  Enc Vitals Group     BP 12/16/16 0434 (!) 91/50     Pulse Rate 12/16/16 0434 72     Resp 12/16/16 0434 17     Temp 12/16/16 0434 98.3 F (36.8 C)     Temp Source 12/16/16 0434 Oral     SpO2 12/16/16 0434 100 %     Weight 12/16/16 0434 172 lb (78 kg)     Height 12/16/16 0434 5\' 2"  (1.575 m)     Head Circumference --      Peak Flow --      Pain Score 12/16/16 0440 9     Pain Loc --      Pain Edu? --      Excl. in [place]? --     Constitutional: somnolent but arousable. Follows asleep mid sentence. Eyes: Conjunctivae are normal. PERRL. EOMI. Head: Atraumatic. Nose: No congestion/rhinnorhea. Mouth/Throat: Mucous membranes are moist.  Oropharynx non-erythematous. Cardiovascular: Normal rate, regular rhythm. Grossly normal heart sounds.  Good peripheral circulation. Respiratory: Normal respiratory effort.  No retractions. Lungs CTAB. Gastrointestinal: Soft and nontender. No distention.  Musculoskeletal: No lower extremity tenderness nor edema.   Neurologic:  Slurred and somnolent speech.  Skin:  Skin is warm, dry and intact. Psychiatric: somnolent  ____________________________________________   LABS (all labs ordered are listed, but only abnormal results are displayed)  Labs Reviewed  BASIC METABOLIC PANEL - Abnormal; Notable for the following:        Result Value   Glucose, Bld 115 (*)    Calcium 8.7 (*)    All other components within normal limits  CBC WITH DIFFERENTIAL/PLATELET - Abnormal; Notable for the following:    WBC 11.3 (*)    RBC 3.78 (*)    Hemoglobin 11.8 (*)    Neutro Abs 7.1 (*)    All other components within normal limits  URINE DRUG SCREEN, QUALITATIVE ([facility] ONLY) - Abnormal; Notable for the following:    Amphetamines, Ur Screen POSITIVE (*)    Cocaine Metabolite,Ur [employer] POSITIVE (*)    Cannabinoid 50 Ng, Ur [place] POSITIVE (*)    Benzodiazepine, Ur Scrn POSITIVE (*)    All other components within normal limits  ETHANOL  TROPONIN I  PREGNANCY, URINE  CK   ____________________________________________  EKG  none ____________________________________________  RADIOLOGY  No results found.  ____________________________________________   PROCEDURES  Procedure(s) performed: None  Procedures  Critical Care  performed: No  ____________________________________________   INITIAL IMPRESSION / ASSESSMENT AND PLAN / ED COURSE  Pertinent labs & imaging results that were available during my care of the patient were reviewed by me and considered in my medical decision making (see chart for details).  His is a 51 year old female who comes into the hospital today with a possible overdose. When I did go in to check on the patient she was not cooperative. She was writhing around complaining that her back hurt and then falling asleep. The patient had a bottle of pills that were multiple different types of pills mixed in 1 bottle. The patient's urine drug screen was positive for amphetamines, cocaine, benzodiazepines and cannabinoids. The patient is still under the influence at this time as she is unable to keep her eyes open to answer questions. I will have the patient evaluated by psych and TTS. I will have the patient remain under involuntary commitment for her increased risk taking behavior and possible suicide attempt by  overdose. The patient will be reevaluated.      ____________________________________________   FINAL CLINICAL IMPRESSION(S) / ED DIAGNOSES  Final diagnoses:  Drug overdose, undetermined intent, initial encounter      NEW MEDICATIONS STARTED DURING THIS VISIT:  New Prescriptions   No medications on file     Note:  This document was prepared using Dragon voice recognition software and may include unintentional dictation errors.    Loney Hering, MD 12/16/16 [phone]

## 2016-12-16 NOTE — ED Notes (Signed)
Pt resting quietly with eyes closed, respirations equal and unlabored. Pt is snoring. VS stable at this time.

## 2016-12-16 NOTE — ED Notes (Signed)
Pt brought to ED by [facility] officer who reports pt has pill bottle that was filled on 12/11/16 but does not have that medication in it. Bottle has several different pills in it. Will send to pharmacy to have pills identified.

## 2016-12-16 NOTE — ED Notes (Signed)
Per pt's request, contacted family member Pamala Hurry at [phone], pt not wishing to share medical information, just wants family to know she is at hospital.

## 2016-12-16 NOTE — ED Notes (Signed)
Pt resting quietly in the hallway, offered food and bathroom, pt declined at this time. Awaiting ride home around 12pm

## 2016-12-16 NOTE — Discharge Instructions (Signed)
Please stop using cocaine, it is extremely dangerous for your health. Follow-up with your primary care physician as needed and return to the emergency department for any concerns.  It was a pleasure to take care of you today, and thank you for coming to our emergency department.  If you have any questions or concerns before leaving please ask the nurse to grab me and I'm more than happy to go through your aftercare instructions again.  If you were prescribed any opioid pain medication today such as Norco, Vicodin, Percocet, morphine, hydrocodone, or oxycodone please make sure you do not drive when you are taking this medication as it can alter your ability to drive safely.  If you have any concerns once you are home that you are not improving or are in fact getting worse before you can make it to your follow-up appointment, please do not hesitate to call 911 and come back for further evaluation.  Darel Hong, MD  Results for orders placed or performed during the hospital encounter of 12/16/16  Ethanol  Result Value Ref Range   Alcohol, Ethyl (B) <5 <5 mg/dL  Basic metabolic panel  Result Value Ref Range   Sodium 139 135 - 145 mmol/L   Potassium 3.6 3.5 - 5.1 mmol/L   Chloride 106 101 - 111 mmol/L   CO2 27 22 - 32 mmol/L   Glucose, Bld 115 (H) 65 - 99 mg/dL   BUN 13 6 - 20 mg/dL   Creatinine, Ser 0.61 0.44 - 1.00 mg/dL   Calcium 8.7 (L) 8.9 - 10.3 mg/dL   GFR calc non Af Amer >60 >60 mL/min   GFR calc Af Amer >60 >60 mL/min   Anion gap 6 5 - 15  CBC with Differential  Result Value Ref Range   WBC 11.3 (H) 3.6 - 11.0 K/uL   RBC 3.78 (L) 3.80 - 5.20 MIL/uL   Hemoglobin 11.8 (L) 12.0 - 16.0 g/dL   HCT 35.1 35.0 - 47.0 %   MCV 92.9 80.0 - 100.0 fL   MCH 31.1 26.0 - 34.0 pg   MCHC 33.5 32.0 - 36.0 g/dL   RDW 13.4 11.5 - 14.5 %   Platelets 353 150 - 440 K/uL   Neutrophils Relative % 63 %   Neutro Abs 7.1 (H) 1.4 - 6.5 K/uL   Lymphocytes Relative 22 %   Lymphs Abs 2.5 1.0 - 3.6  K/uL   Monocytes Relative 8 %   Monocytes Absolute 0.9 0.2 - 0.9 K/uL   Eosinophils Relative 6 %   Eosinophils Absolute 0.7 0 - 0.7 K/uL   Basophils Relative 1 %   Basophils Absolute 0.1 0 - 0.1 K/uL  Troponin I  Result Value Ref Range   Troponin I <0.03 <0.03 ng/mL  Urine Drug Screen, Qualitative ([facility] only)  Result Value Ref Range   Tricyclic, Ur Screen NONE DETECTED NONE DETECTED   Amphetamines, Ur Screen POSITIVE (A) NONE DETECTED   MDMA (Ecstasy)Ur Screen NONE DETECTED NONE DETECTED   Cocaine Metabolite,Ur [place] POSITIVE (A) NONE DETECTED   Opiate, Ur Screen NONE DETECTED NONE DETECTED   Phencyclidine (PCP) Ur S NONE DETECTED NONE DETECTED   Cannabinoid 50 Ng, Ur [place] POSITIVE (A) NONE DETECTED   Barbiturates, Ur Screen NONE DETECTED NONE DETECTED   Benzodiazepine, Ur Scrn POSITIVE (A) NONE DETECTED   Methadone Scn, Ur NONE DETECTED NONE DETECTED  Pregnancy, urine  Result Value Ref Range   Preg Test, Ur NEGATIVE NEGATIVE   Dg Lumbar Spine Complete  Result Date: 12/09/2016 CLINICAL DATA:  [place] while going down stairs. Extreme low back pain. EXAM: LUMBAR SPINE - COMPLETE 4+ VIEW COMPARISON:  MRI lumbar spine Sep 05, 2014 FINDINGS: Five non rib-bearing lumbar-type vertebral bodies are intact and aligned with maintenance of the lumbar lordosis. Mild broad lower lumbar levoscoliosis may be positional. Intervertebral disc heights are normal. No destructive bony lesions. Moderate lower lumbar facet arthropathy. Sacroiliac joints are symmetric. Included prevertebral and paraspinal soft tissue planes are non-suspicious. IMPRESSION: No acute fracture deformity or malalignment. Stable lower lumbar facet arthropathy. Electronically Signed   By: Elon Alas M.D.   On: 12/09/2016 04:56   Dg Pelvis 1-2 Views  Result Date: 12/09/2016 CLINICAL DATA:  [place] while going down stairs.  Severe back pain. EXAM: PELVIS - 1-2 VIEW COMPARISON:  Lumbar spine radiograph November 20, 2011 FINDINGS: There is no  evidence of pelvic fracture or diastasis. Chronic mild offset of the pubic symphysis. No pelvic bone lesions are seen. IMPRESSION: Negative. Electronically Signed   By: Elon Alas M.D.   On: 12/09/2016 04:57

## 2016-12-16 NOTE — ED Notes (Signed)
Pt given Coke, offered a meal, but declined. Pt's ride Pamala Hurry is coming to get patient at [facility]. Pt falls asleep frequently, but is able to be aroused. Pt updated on plan and verbalized understanding.

## 2016-12-16 NOTE — ED Provider Notes (Addendum)
The patient is now awake saying "I feel fine and wanted to go home". No longer intoxicated. She is medically stable for outpatient management.Dr. Dahlia Client had involuntarily committed the patient for unintentional drug overdose. She denies suicidality and said this was all an accident. No reason for IVC to continue.   Darel Hong, MD 12/16/16 3094    Darel Hong, MD 12/16/16 0768    Darel Hong, MD 12/16/16 [phone]

## 2016-12-16 NOTE — ED Notes (Addendum)
Pt reports she took her own medication as prescribed, including lexapro, xanax, and prilosec, also reports using cocaine.  Pt reports she has not slept in several days since she has a new job and has been working two shifts a day.  Pt falling asleep during assessment.

## 2016-12-16 NOTE — ED Notes (Signed)
Pills identified by pharmacy tech, as listed:  Bupropion SR 200mg  x 3 Tizanidine 4mg  x 1 Escitalopram 20mg  x 1 Omeprazole 20mg  x 1 Unidentifiable (possibly vitamins) x 4

## 2016-12-16 NOTE — ED Triage Notes (Signed)
Pt arrives via [employer] with possible overdose. Pt denies overdose on medication and reports that she takes her medication only as prescribed. Pt was brought in for threats made to family and she denies that threats were made. Pt has chronic pain in her lower back. Pt admits to cocaine use x 2 days ago. Pt is cooperative at this time in triage.

## 2016-12-16 NOTE — ED Notes (Signed)
Pt ambulatory to lobby to meet family. Pt denies any needs at this time. Discussed discharge paperwork and follow up instructions.  Denies any SI/HI at this time.

## 2016-12-23 ENCOUNTER — Ambulatory Visit
Admission: RE | Admit: 2016-12-23 | Discharge: 2016-12-23 | Disposition: A | Discharge status: Home / Self Care | Payer: Medicaid Other | Source: Ambulatory Visit | Attending: Pain Medicine | Admitting: Pain Medicine

## 2016-12-23 DIAGNOSIS — M25561 Pain in right knee: Secondary | ICD-10-CM | POA: Insufficient documentation

## 2016-12-23 DIAGNOSIS — Z8739 Personal history of other diseases of the musculoskeletal system and connective tissue: Secondary | ICD-10-CM

## 2016-12-23 DIAGNOSIS — M25512 Pain in left shoulder: Secondary | ICD-10-CM | POA: Diagnosis not present

## 2016-12-23 DIAGNOSIS — G8929 Other chronic pain: Secondary | ICD-10-CM | POA: Diagnosis present

## 2016-12-23 DIAGNOSIS — M25511 Pain in right shoulder: Secondary | ICD-10-CM | POA: Diagnosis not present

## 2016-12-23 DIAGNOSIS — M47896 Other spondylosis, lumbar region: Secondary | ICD-10-CM | POA: Insufficient documentation

## 2016-12-23 DIAGNOSIS — M5030 Other cervical disc degeneration, unspecified cervical region: Secondary | ICD-10-CM | POA: Insufficient documentation

## 2016-12-23 DIAGNOSIS — M542 Cervicalgia: Principal | ICD-10-CM

## 2016-12-23 DIAGNOSIS — M25551 Pain in right hip: Secondary | ICD-10-CM | POA: Insufficient documentation

## 2016-12-23 DIAGNOSIS — M79671 Pain in right foot: Secondary | ICD-10-CM | POA: Insufficient documentation

## 2016-12-23 DIAGNOSIS — M4186 Other forms of scoliosis, lumbar region: Secondary | ICD-10-CM | POA: Insufficient documentation

## 2016-12-28 ENCOUNTER — Encounter: Payer: Self-pay | Admitting: Emergency Medicine

## 2016-12-28 ENCOUNTER — Emergency Department
Admission: EM | Admit: 2016-12-28 | Discharge: 2016-12-28 | Disposition: A | Discharge status: Home / Self Care | Payer: Medicaid Other | Attending: Emergency Medicine | Admitting: Emergency Medicine

## 2016-12-28 DIAGNOSIS — Z79899 Other long term (current) drug therapy: Secondary | ICD-10-CM | POA: Insufficient documentation

## 2016-12-28 DIAGNOSIS — M5442 Lumbago with sciatica, left side: Secondary | ICD-10-CM | POA: Insufficient documentation

## 2016-12-28 DIAGNOSIS — F17210 Nicotine dependence, cigarettes, uncomplicated: Secondary | ICD-10-CM | POA: Diagnosis not present

## 2016-12-28 DIAGNOSIS — M545 Low back pain: Secondary | ICD-10-CM | POA: Diagnosis present

## 2016-12-28 MED ORDER — DIAZEPAM 2 MG PO TABS
2.0000 mg | ORAL_TABLET | Freq: Once | ORAL | Status: AC
  Administered 2016-12-28: 2 mg via ORAL
  Filled 2016-12-28: qty 1

## 2016-12-28 MED ORDER — TRAMADOL HCL 50 MG PO TABS: 50.0000 mg | ORAL_TABLET | Freq: Four times a day (QID) | ORAL | 0 refills | Status: DC | PRN

## 2016-12-28 MED ORDER — KETOROLAC TROMETHAMINE 30 MG/ML IJ SOLN
30.0000 mg | Freq: Once | INTRAMUSCULAR | Status: AC
  Administered 2016-12-28: 30 mg via INTRAMUSCULAR
  Filled 2016-12-28: qty 1

## 2016-12-28 NOTE — ED Triage Notes (Signed)
Pt ambulatory to triage, limping gait, appears uncomfortable; c/o lower back pain radiating down left leg; st seen here recently for same and dx with sciatica

## 2016-12-28 NOTE — ED Provider Notes (Signed)
[facility] Emergency Department Provider Note  ____________________________________________   First MD Initiated Contact with Patient 12/28/16 [phone]     (approximate)  I have reviewed the triage vital signs and the nursing notes.   HISTORY  Chief Complaint Back Pain   HPI [name] is a 51 y.o. female with a history of chronic back pain who is presenting to the emergency department with left lower lumbar pain over the past 3 days. She says that she was seen here in mid to late August after a fall and had left lower back pain at that time. She was discharged with tramadol as well as Flexeril but is one out of her tramadol as 3 days ago. She says the pain is worse with movement and feels like a squeezing pain when she tries to move. She is denying any loss of bowel or bladder continence. Denies any weakness or numbness. Says that she is currently in the process of getting in with pain management but needs to see a psychologist prior to evaluation by pain management. She says the pain also radiates down to the left lateral and posterior thigh and stops just proximal to the knee.   Past Medical History:  Diagnosis Date  . Anxiety   . Arthritis    PSORIATIC ARTHRITIS  . Depression   . Headache    MIGRAINE  . Ulcer     Patient Active Problem List   Diagnosis Date Noted  . Long term prescription benzodiazepine use 11/27/2016  . Chronic low back pain (Primary Area of Pain) (Right) 11/27/2016  . Chronic lower extremity pain (Secondary Area of pain) (Right) 11/27/2016  . Chronic hip pain [facility] Area of Pain) (Right) 11/27/2016  . Chronic knee pain (Fourth Area of Pain) (Right) 11/27/2016  . Chronic foot pain (Fifth Area of Pain) (Right) 11/27/2016  . Chronic shoulder pain (Bilateral) (R>L) 11/27/2016  . Chronic neck pain (Right) 11/27/2016  . Atypical facial pain (Bilateral) (R>L) 11/27/2016  . History of TMJ syndrome 11/27/2016  . Lumbar facet joint  syndrome (Bilateral) (R>L) 11/27/2016  . Chronic sacroiliac joint pain (Bilateral) (L>R) 11/27/2016  . Lumbar facet hypertrophy (Multilevel) (Bilateral) 11/27/2016  . Long term current use of opiate analgesic 10/23/2016  . Long term prescription opiate use 10/23/2016  . Opiate use 10/23/2016  . Chronic pain syndrome 10/23/2016  . Cystocele 12/20/2012  . Dyssynergia 12/20/2012  . Fecal incontinence 12/20/2012  . Mixed incontinence 12/20/2012  . Rectocele 12/20/2012  . Psoriasis 08/16/2012  . Psoriatic arthritis ([place]) 08/16/2012  . 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 GERD (gastroesophageal reflux disease) 05/24/2012  . Hidradenitis 05/24/2012  . Migraine 05/24/2012    Past Surgical History:  Procedure Laterality Date  . BREAST REDUCTION SURGERY    . COLONOSCOPY WITH PROPOFOL N/A 05/19/2016   Procedure: COLONOSCOPY WITH PROPOFOL;  Surgeon: Lollie Sails, MD;  Location: [facility] ENDOSCOPY;  Service: Endoscopy;  Laterality: N/A;  . foot spurs    . FRACTURE SURGERY    . MANDIBLE FRACTURE SURGERY    . TUBAL LIGATION      Prior to Admission medications   Medication Sig Start Date End Date Taking? Authorizing Provider  ALPRAZolam Duanne Moron) 1 MG tablet Take 1 mg by mouth 4 (four) times daily.    [provider]  amphetamine-dextroamphetamine (ADDERALL) 20 MG tablet Take 20 mg by mouth 2 (two) times daily.     [provider]  buPROPion (WELLBUTRIN XL) 300 MG 24 hr tablet Take  300 mg by mouth daily.    [provider]  clotrimazole-betamethasone (LOTRISONE) cream APP TO AFFECTED EAR ONCE OR TWICE WEEKLY PRN FOR SKIN IRRITATION. 11/09/16   [provider]  cyclobenzaprine (FLEXERIL) 10 MG tablet Take 1 tablet (10 mg total) by mouth 3 (three) times daily as needed for muscle spasms. 12/09/16   Rudene Re, MD  escitalopram (LEXAPRO) 20 MG tablet Take 30 mg by mouth daily.     [provider]  omeprazole (PRILOSEC) 20 MG  capsule Take 20 mg by mouth 2 (two) times daily before a meal.    [provider]  traMADol (ULTRAM) 50 MG tablet Take 1 tablet (50 mg total) by mouth every 6 (six) hours as needed. 12/09/16 12/09/17  Rudene Re, MD    Allergies Topiramate  Family History  Problem Relation Age of Onset  . Liver cancer Mother   . Cancer Father     Social History Social History  Substance Use Topics  . Smoking status: Current Some Day Smoker    Types: Cigarettes  . Smokeless tobacco: Never Used  . Alcohol use No    Review of Systems  Constitutional: No fever/chills Eyes: No visual changes. ENT: No sore throat. Cardiovascular: Denies chest pain. Respiratory: Denies shortness of breath. Gastrointestinal: No abdominal pain.  No nausea, no vomiting.  No diarrhea.  No constipation. Genitourinary: Negative for dysuria. Musculoskeletal: s above Skin: Negative for rash. Neurological: Negative for headaches, focal weakness or numbness.   ____________________________________________   PHYSICAL EXAM:  VITAL SIGNS: ED Triage Vitals  Enc Vitals Group     BP 12/28/16 0354 108/68     Pulse Rate 12/28/16 0354 77     Resp 12/28/16 0354 20     Temp 12/28/16 0354 97.7 F (36.5 C)     Temp Source 12/28/16 0354 Oral     SpO2 12/28/16 0354 100 %     Weight 12/28/16 0353 170 lb (77.1 kg)     Height 12/28/16 0353 5\' 2"  (1.575 m)     Head Circumference --      Peak Flow --      Pain Score 12/28/16 0353 10     Pain Loc --      Pain Edu? --      Excl. in [place]? --    Constitutional: Alert and oriented. Well appearing and in no acute distress. Eyes: Conjunctivae are normal.  Head: Atraumatic. Nose: No congestion/rhinnorhea. Mouth/Throat: Mucous membranes are moist.  Neck: No stridor.   Cardiovascular: Normal rate, regular rhythm. Grossly normal heart sounds.  Respiratory: Normal respiratory effort.  No retractions. Lungs CTAB. Gastrointestinal: Soft and nontender. No distention. No  CVA tenderness. Musculoskeletal: No lower extremity tenderness nor edema.  No joint effusions.negative straight leg raise bilaterally. No saddle anesthesia. 5 out of 5 strength bilateral lower extremities.  Left lateral lumbar region over the iliac crest with tenderness to palpation without any ecchymosis, deformity or step-off. No midline tenderness to palpation. Pelvis is stable and there is no crepitus. Neurologic:  Normal speech and language. No gross focal neurologic deficits are appreciated. Skin:  Skin is warm, dry and intact. No rash noted. Psychiatric: Mood and affect are normal. Speech and behavior are normal.  ____________________________________________   LABS (all labs ordered are listed, but only abnormal results are displayed)  Labs Reviewed - No data to display ____________________________________________  EKG   ____________________________________________  RADIOLOGY   ____________________________________________   PROCEDURES  Procedure(s) performed:   Procedures  Critical Care performed:  ____________________________________________   INITIAL IMPRESSION / ASSESSMENT AND PLAN / ED COURSE  Pertinent labs & imaging results that were available during my care of the patient were reviewed by me and considered in my medical decision making (see chart for details).  ----------------------------------------- 4:58 AM on 12/28/2016 -----------------------------------------  Pain improved after Valium and Toradol. No objective signs of spinal cord compression. The patient is now sitting up on the side of the bed and reports that her pain is mild to gone at this time. She'll be discharged with several more days of tramadol. She'll be following up with her pain specialist as planned.  because the injury was traumatic and the pain was secondary to this will not be prescribing her steroids at this time. We'll continue his tramadol as well as ibuprofen at home as the  patient has been using.   ____________________________________________   FINAL CLINICAL IMPRESSION(S) / ED DIAGNOSES  Sciatica. Low back pain.    NEW MEDICATIONS STARTED DURING THIS VISIT:  New Prescriptions   No medications on file     Note:  This document was prepared using Dragon voice recognition software and may include unintentional dictation errors.     Orbie Pyo, MD 12/28/16 [id number]

## 2017-01-18 ENCOUNTER — Encounter: Payer: Self-pay | Admitting: Nurse Practitioner

## 2017-01-18 DIAGNOSIS — F1490 Cocaine use, unspecified, uncomplicated: Secondary | ICD-10-CM | POA: Insufficient documentation

## 2017-01-18 DIAGNOSIS — E559 Vitamin D deficiency, unspecified: Secondary | ICD-10-CM | POA: Insufficient documentation

## 2017-01-31 ENCOUNTER — Ambulatory Visit: Payer: Medicaid Other

## 2017-02-05 ENCOUNTER — Ambulatory Visit
Admission: RE | Admit: 2017-02-05 | Discharge: 2017-02-05 | Disposition: A | Discharge status: Home / Self Care | Payer: Medicaid Other | Source: Ambulatory Visit | Attending: Gastroenterology | Admitting: Gastroenterology

## 2017-02-05 DIAGNOSIS — Q313 Laryngocele: Secondary | ICD-10-CM | POA: Diagnosis not present

## 2017-02-05 DIAGNOSIS — K449 Diaphragmatic hernia without obstruction or gangrene: Secondary | ICD-10-CM | POA: Insufficient documentation

## 2017-02-05 DIAGNOSIS — R1310 Dysphagia, unspecified: Secondary | ICD-10-CM | POA: Insufficient documentation

## 2017-02-05 DIAGNOSIS — K219 Gastro-esophageal reflux disease without esophagitis: Secondary | ICD-10-CM | POA: Diagnosis not present

## 2017-02-06 ENCOUNTER — Inpatient Hospital Stay
Admission: RE | Admit: 2017-02-06 | Discharge: 2017-02-06 | Disposition: A | Discharge status: Home / Self Care | Payer: Self-pay | Source: Ambulatory Visit | Attending: *Deleted | Admitting: *Deleted

## 2017-02-06 ENCOUNTER — Other Ambulatory Visit: Payer: Self-pay | Admitting: *Deleted

## 2017-02-06 DIAGNOSIS — Z9289 Personal history of other medical treatment: Secondary | ICD-10-CM

## 2017-02-07 ENCOUNTER — Other Ambulatory Visit: Payer: Self-pay | Admitting: Family Medicine

## 2017-02-07 DIAGNOSIS — R928 Other abnormal and inconclusive findings on diagnostic imaging of breast: Secondary | ICD-10-CM

## 2017-02-14 ENCOUNTER — Ambulatory Visit
Admission: RE | Admit: 2017-02-14 | Discharge: 2017-02-14 | Disposition: A | Discharge status: Home / Self Care | Payer: Medicaid Other | Source: Ambulatory Visit | Attending: Family Medicine | Admitting: Family Medicine

## 2017-02-14 DIAGNOSIS — R928 Other abnormal and inconclusive findings on diagnostic imaging of breast: Secondary | ICD-10-CM

## 2017-03-09 ENCOUNTER — Encounter: Payer: Self-pay | Admitting: *Deleted

## 2017-03-12 ENCOUNTER — Ambulatory Visit: Admission: RE | Admit: 2017-03-12 | Payer: Medicaid Other | Source: Ambulatory Visit | Admitting: Gastroenterology

## 2017-03-12 ENCOUNTER — Encounter: Admission: RE | Payer: Self-pay | Source: Ambulatory Visit

## 2017-03-12 SURGERY — ESOPHAGOGASTRODUODENOSCOPY (EGD) WITH PROPOFOL
Anesthesia: General

## 2017-07-24 SURGERY — ESOPHAGOGASTRODUODENOSCOPY (EGD) WITH PROPOFOL
Anesthesia: General

## 2017-07-24 MED ORDER — LIDOCAINE HCL (PF) 2 % IJ SOLN
INTRAMUSCULAR | Status: AC
  Filled 2017-07-24: qty 10

## 2017-07-24 MED ORDER — PROPOFOL 500 MG/50ML IV EMUL
INTRAVENOUS | Status: AC
  Filled 2017-07-24: qty 50

## 2017-07-26 ENCOUNTER — Ambulatory Visit: Admission: RE | Admit: 2017-07-26 | Payer: Medicaid Other | Source: Ambulatory Visit | Admitting: Gastroenterology

## 2017-07-26 HISTORY — DX: Polyp of colon: K63.5

## 2017-10-04 ENCOUNTER — Emergency Department
Admission: EM | Admit: 2017-10-04 | Discharge: 2017-10-04 | Disposition: A | Discharge status: Home / Self Care | Payer: Medicaid Other | Attending: Emergency Medicine | Admitting: Emergency Medicine

## 2017-10-04 ENCOUNTER — Other Ambulatory Visit: Payer: Self-pay

## 2017-10-04 ENCOUNTER — Emergency Department: Payer: Medicaid Other

## 2017-10-04 ENCOUNTER — Encounter: Payer: Self-pay | Admitting: Emergency Medicine

## 2017-10-04 DIAGNOSIS — Y998 Other external cause status: Secondary | ICD-10-CM | POA: Insufficient documentation

## 2017-10-04 DIAGNOSIS — F17210 Nicotine dependence, cigarettes, uncomplicated: Secondary | ICD-10-CM | POA: Diagnosis not present

## 2017-10-04 DIAGNOSIS — F1490 Cocaine use, unspecified, uncomplicated: Secondary | ICD-10-CM | POA: Insufficient documentation

## 2017-10-04 DIAGNOSIS — S40012A Contusion of left shoulder, initial encounter: Secondary | ICD-10-CM

## 2017-10-04 DIAGNOSIS — F329 Major depressive disorder, single episode, unspecified: Secondary | ICD-10-CM | POA: Insufficient documentation

## 2017-10-04 DIAGNOSIS — F419 Anxiety disorder, unspecified: Secondary | ICD-10-CM | POA: Diagnosis not present

## 2017-10-04 DIAGNOSIS — W010XXA Fall on same level from slipping, tripping and stumbling without subsequent striking against object, initial encounter: Secondary | ICD-10-CM | POA: Insufficient documentation

## 2017-10-04 DIAGNOSIS — Y9389 Activity, other specified: Secondary | ICD-10-CM | POA: Diagnosis not present

## 2017-10-04 DIAGNOSIS — Z79899 Other long term (current) drug therapy: Secondary | ICD-10-CM | POA: Diagnosis not present

## 2017-10-04 DIAGNOSIS — S4992XA Unspecified injury of left shoulder and upper arm, initial encounter: Secondary | ICD-10-CM | POA: Diagnosis present

## 2017-10-04 DIAGNOSIS — Y929 Unspecified place or not applicable: Secondary | ICD-10-CM | POA: Diagnosis not present

## 2017-10-04 MED ORDER — IBUPROFEN 600 MG PO TABS: 600.0000 mg | ORAL_TABLET | Freq: Three times a day (TID) | ORAL | 0 refills | Status: DC | PRN

## 2017-10-04 MED ORDER — TRAMADOL HCL 50 MG PO TABS: 50.0000 mg | ORAL_TABLET | Freq: Two times a day (BID) | ORAL | 0 refills | Status: DC | PRN

## 2017-10-04 NOTE — ED Triage Notes (Signed)
Pt to ED via POV with c/o LFT shoulder pain after mechanical fall yesterday. States pain has been worsening, pt ambualtory, denies any other complaints. PT [id number]&[id number]

## 2017-10-04 NOTE — ED Provider Notes (Signed)
[facility] Emergency Department Provider Note   ____________________________________________   First MD Initiated Contact with Patient 10/04/17 1516     (approximate)  I have reviewed the triage vital signs and the nursing notes.   HISTORY  Chief Complaint Shoulder Injury    HPI [name] is a 52 y.o. female patient complain of left shoulder pain secondary to a mechanical fall yesterday.  Patient did pain increased over today.  Patient rates pain as a 7/10.  Patient described the pain is "aching".  No palliative measures for complaint.  Patient has a history of chronic bilateral shoulder pain.  Patient also has a long-term opiate use.  Patient also on long-term prescribed benzodiazepine.  Past Medical History:  Diagnosis Date  . Anxiety   . Arthritis    PSORIATIC ARTHRITIS  . Colon polyp   . Depression   . Headache    MIGRAINE  . Ulcer     Patient Active Problem List   Diagnosis Date Noted  . Cocaine use 01/18/2017  . Vitamin D insufficiency 01/18/2017  . Long term prescription benzodiazepine use 11/27/2016  . Chronic low back pain (Primary Area of Pain) (Right) 11/27/2016  . Chronic lower extremity pain (Secondary Area of pain) (Right) 11/27/2016  . Chronic hip pain [facility] Area of Pain) (Right) 11/27/2016  . Chronic knee pain (Fourth Area of Pain) (Right) 11/27/2016  . Chronic foot pain (Fifth Area of Pain) (Right) 11/27/2016  . Chronic shoulder pain (Bilateral) (R>L) 11/27/2016  . Chronic neck pain (Right) 11/27/2016  . Atypical facial pain (Bilateral) (R>L) 11/27/2016  . History of TMJ syndrome 11/27/2016  . 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 Chronic sacroiliac joint pain (Bilateral) (L>R) 11/27/2016  . Lumbar facet hypertrophy (Multilevel) (Bilateral) 11/27/2016  . Long term current use of opiate analgesic 10/23/2016  . Long term prescription opiate use 10/23/2016  . Opiate use 10/23/2016  . Chronic pain  syndrome 10/23/2016  . Cystocele 12/20/2012  . Dyssynergia 12/20/2012  . Fecal incontinence 12/20/2012  . Mixed incontinence 12/20/2012  . Rectocele 12/20/2012  . Psoriasis 08/16/2012  . Psoriatic arthritis ([place]) 08/16/2012  . 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 GERD (gastroesophageal reflux disease) 05/24/2012  . Hidradenitis 05/24/2012  . Migraine 05/24/2012    Past Surgical History:  Procedure Laterality Date  . BREAST REDUCTION SURGERY    . COLONOSCOPY WITH ESOPHAGOGASTRODUODENOSCOPY (EGD)    . COLONOSCOPY WITH PROPOFOL N/A 05/19/2016   Procedure: COLONOSCOPY WITH PROPOFOL;  Surgeon: Lollie Sails, MD;  Location: [facility] ENDOSCOPY;  Service: Endoscopy;  Laterality: N/A;  . foot spurs    . FRACTURE SURGERY    . MANDIBLE FRACTURE SURGERY    . TUBAL LIGATION      Prior to Admission medications   Medication Sig Start Date End Date Taking? Authorizing Provider  ALPRAZolam Duanne Moron) 1 MG tablet Take 1 mg by mouth 4 (four) times daily.    [provider]  amphetamine-dextroamphetamine (ADDERALL) 20 MG tablet Take 20 mg by mouth 2 (two) times daily.     [provider]  buPROPion (WELLBUTRIN XL) 300 MG 24 hr tablet Take 300 mg by mouth daily.    [provider]  clobetasol ointment (TEMOVATE) 1.74 % Apply 1 application 2 (two) times daily topically.    [provider]  clotrimazole-betamethasone (LOTRISONE) cream APP TO AFFECTED EAR ONCE OR TWICE WEEKLY PRN FOR SKIN IRRITATION. 11/09/16   [provider]  cyclobenzaprine (FLEXERIL) 10 MG tablet Take  1 tablet (10 mg total) by mouth 3 (three) times daily as needed for muscle spasms. 12/09/16   Rudene Re, MD  diclofenac (FLECTOR) 1.3 % [facility] 1 patch 2 (two) times daily onto the skin.    [provider]  diclofenac sodium (VOLTAREN) 1 % GEL Apply 4 (four) times daily topically.    [provider]  escitalopram (LEXAPRO) 20 MG tablet Take 30 mg by  mouth daily.     [provider]  etodolac (LODINE) 500 MG tablet Take 500 mg 2 (two) times daily by mouth.    [provider]  fluocinonide (LIDEX) 0.05 % external solution Apply 1 application 2 (two) times daily topically.    [provider]  fluticasone (FLONASE) 50 MCG/ACT nasal spray Place daily into both nostrils.    [provider]  ibuprofen (ADVIL,MOTRIN) 600 MG tablet Take 1 tablet (600 mg total) by mouth every 8 (eight) hours as needed. 10/04/17   Sable Feil, PA-C  omeprazole (PRILOSEC) 20 MG capsule Take 20 mg by mouth 2 (two) times daily before a meal.    [provider]  oxyCODONE (ROXICODONE) 15 MG immediate release tablet Take 10 mg every 8 (eight) hours as needed by mouth for pain.    [provider]  traMADol (ULTRAM) 50 MG tablet Take 1 tablet (50 mg total) by mouth every 6 (six) hours as needed for moderate pain or severe pain. 12/28/16 12/28/17  Schaevitz, Randall An, MD  traMADol (ULTRAM) 50 MG tablet Take 1 tablet (50 mg total) by mouth every 12 (twelve) hours as needed. 10/04/17   Sable Feil, PA-C    Allergies Topiramate  Family History  Problem Relation Age of Onset  . Liver cancer Mother   . Cancer Father     Social History Social History   Tobacco Use  . Smoking status: Current Some Day Smoker    Types: Cigarettes  . Smokeless tobacco: Never Used  Substance Use Topics  . Alcohol use: No  . Drug use: No    Review of Systems Constitutional: No fever/chills Eyes: No visual changes. ENT: No sore throat. Cardiovascular: Denies chest pain. Respiratory: Denies shortness of breath. Gastrointestinal: No abdominal pain.  No nausea, no vomiting.  No diarrhea.  No constipation. Genitourinary: Negative for dysuria. Musculoskeletal: Left shoulder pain Skin: Negative for rash. Neurological: Negative for headaches, focal weakness or numbness. Psychiatric:Anxiety/ depression Allergic/Immunilogical:  Topamax  ____________________________________________   PHYSICAL EXAM:  VITAL SIGNS: ED Triage Vitals [10/04/17 1513]  Enc Vitals Group     BP (!) 166/99     Pulse Rate 77     Resp 16     Temp 99.4 F (37.4 C)     Temp Source Oral     SpO2 98 %     Weight 168 lb (76.2 kg)     Height 5\' 3"  (1.6 m)     Head Circumference      Peak Flow      Pain Score 7     Pain Loc      Pain Edu?      Excl. in [place]?    Constitutional: Alert and oriented. Well appearing and in no acute distress. Neck: No stridor. No cervical spine tenderness to palpation. Cardiovascular: Normal rate, regular rhythm. Grossly normal heart sounds.  Good peripheral circulation.  Elevated blood pressure Respiratory: Normal respiratory effort.  No retractions. Lungs CTAB. Musculoskeletal: No obvious deformity to the left shoulder.  Patient has decreased range of  motion with abduction overhead reaching.. Neurologic:  Normal speech and language. No gross focal neurologic deficits are appreciated. No gait instability. Skin:  Skin is warm, dry and intact. No rash noted. Psychiatric: Mood and affect are normal. Speech and behavior are normal.  ____________________________________________   LABS (all labs ordered are listed, but only abnormal results are displayed)  Labs Reviewed - No data to display ____________________________________________  EKG   ____________________________________________  RADIOLOGY   No acute findings on x-ray.  Official radiology report(s): Dg Shoulder Left  Result Date: 10/04/2017 CLINICAL DATA:  Left shoulder pain after fall yesterday. EXAM: LEFT SHOULDER - 2+ VIEW COMPARISON:  Left shoulder x-rays dated December 23, 2016. FINDINGS: There is no evidence of fracture or dislocation. There is no evidence of arthropathy or other focal bone abnormality. Soft tissues are unremarkable. IMPRESSION: Negative. Electronically Signed   By: Titus Dubin M.D.   On: 10/04/2017 15:43     ____________________________________________   PROCEDURES  Procedure(s) performed: None  Procedures  Critical Care performed: No  ____________________________________________   INITIAL IMPRESSION / ASSESSMENT AND PLAN / ED COURSE  As part of my medical decision making, I reviewed the following data within the electronic MEDICAL RECORD NUMBER    Left shoulder pain secondary to fall.  Discussed x-ray findings with patient.  Patient placed in arm sling and given discharge care instruction.  Patient advised take medication as directed and follow-up PCP.      ____________________________________________   FINAL CLINICAL IMPRESSION(S) / ED DIAGNOSES  Final diagnoses:  Contusion of left shoulder, initial encounter     ED Discharge Orders        Ordered    traMADol (ULTRAM) 50 MG tablet  Every 12 hours PRN     10/04/17 1544    ibuprofen (ADVIL,MOTRIN) 600 MG tablet  Every 8 hours PRN     10/04/17 1544       Note:  This document was prepared using Dragon voice recognition software and may include unintentional dictation errors.    Sable Feil, PA-C 10/04/17 1548    Earleen Newport, MD 10/09/17 [phone]

## 2017-10-04 NOTE — Discharge Instructions (Signed)
Advised arm sling for 2 to 3 days.  Take medication as directed and follow discharge care instructions.

## 2017-10-04 NOTE — ED Notes (Signed)
E-signature obtained by pt and nurse, but did not cross over (error msg came up).

## 2017-10-09 ENCOUNTER — Ambulatory Visit: Admission: RE | Admit: 2017-10-09 | Payer: Medicaid Other | Source: Ambulatory Visit | Admitting: Gastroenterology

## 2017-10-09 ENCOUNTER — Encounter: Admission: RE | Payer: Self-pay | Source: Ambulatory Visit

## 2017-10-09 SURGERY — ESOPHAGOGASTRODUODENOSCOPY (EGD) WITH PROPOFOL
Anesthesia: General

## 2017-11-28 ENCOUNTER — Encounter: Payer: Self-pay | Admitting: Emergency Medicine

## 2017-11-28 ENCOUNTER — Other Ambulatory Visit: Payer: Self-pay

## 2017-11-28 ENCOUNTER — Emergency Department: Payer: Medicaid Other

## 2017-11-28 ENCOUNTER — Emergency Department
Admission: EM | Admit: 2017-11-28 | Discharge: 2017-11-28 | Disposition: A | Discharge status: Home / Self Care | Payer: Medicaid Other | Attending: Emergency Medicine | Admitting: Emergency Medicine

## 2017-11-28 DIAGNOSIS — Y939 Activity, unspecified: Secondary | ICD-10-CM | POA: Insufficient documentation

## 2017-11-28 DIAGNOSIS — S90922A Unspecified superficial injury of left foot, initial encounter: Secondary | ICD-10-CM | POA: Diagnosis present

## 2017-11-28 DIAGNOSIS — Y92003 Bedroom of unspecified non-institutional (private) residence as the place of occurrence of the external cause: Secondary | ICD-10-CM | POA: Diagnosis not present

## 2017-11-28 DIAGNOSIS — Y999 Unspecified external cause status: Secondary | ICD-10-CM | POA: Diagnosis not present

## 2017-11-28 DIAGNOSIS — F17210 Nicotine dependence, cigarettes, uncomplicated: Secondary | ICD-10-CM | POA: Insufficient documentation

## 2017-11-28 DIAGNOSIS — S90122A Contusion of left lesser toe(s) without damage to nail, initial encounter: Secondary | ICD-10-CM | POA: Insufficient documentation

## 2017-11-28 DIAGNOSIS — W2203XA Walked into furniture, initial encounter: Secondary | ICD-10-CM | POA: Diagnosis not present

## 2017-11-28 MED ORDER — HYDROCODONE-ACETAMINOPHEN 5-325 MG PO TABS
1.0000 | ORAL_TABLET | Freq: Once | ORAL | Status: AC
  Administered 2017-11-28: 1 via ORAL
  Filled 2017-11-28: qty 1

## 2017-11-28 MED ORDER — HYDROCODONE-ACETAMINOPHEN 5-325 MG PO TABS: 1.0000 | ORAL_TABLET | Freq: Four times a day (QID) | ORAL | 0 refills | Status: DC | PRN

## 2017-11-28 NOTE — Discharge Instructions (Addendum)
Follow-up with your primary care provider if any continued problems.  Begin taking Norco as needed for pain every 6 hours.  Do not drive or operate machinery while taking this medication.  You may also take ibuprofen 3 tablets with food every 8 hours.  Ice and elevate as needed for pain and swelling.

## 2017-11-28 NOTE — ED Triage Notes (Signed)
Presents with pain to left middle toe  States she slammed it into some wood this am  Swelling and bruising noted

## 2017-11-28 NOTE — ED Provider Notes (Signed)
[facility] Emergency Department Provider Note  ____________________________________________   First MD Initiated Contact with Patient 11/28/17 1429     (approximate)  I have reviewed the triage vital signs and the nursing notes.   HISTORY  Chief Complaint Toe Pain   HPI [name] is a 52 y.o. female presents to the emergency department with complaint of left third middle toe pain.  Patient states that she hit it against a dresser today.  She is continued to have pain and swelling in the area.  She denies any previous fracture to her toe.  She rates her pain as a 10/10.  Past Medical History:  Diagnosis Date  . Anxiety   . Arthritis    PSORIATIC ARTHRITIS  . Colon polyp   . Depression   . Headache    MIGRAINE  . Ulcer     Patient Active Problem List   Diagnosis Date Noted  . Cocaine use 01/18/2017  . Vitamin D insufficiency 01/18/2017  . Long term prescription benzodiazepine use 11/27/2016  . Chronic low back pain (Primary Area of Pain) (Right) 11/27/2016  . Chronic lower extremity pain (Secondary Area of pain) (Right) 11/27/2016  . Chronic hip pain [facility] Area of Pain) (Right) 11/27/2016  . Chronic knee pain (Fourth Area of Pain) (Right) 11/27/2016  . Chronic foot pain (Fifth Area of Pain) (Right) 11/27/2016  . Chronic shoulder pain (Bilateral) (R>L) 11/27/2016  . Chronic neck pain (Right) 11/27/2016  . Atypical facial pain (Bilateral) (R>L) 11/27/2016  . History of TMJ syndrome 11/27/2016  . 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 Chronic sacroiliac joint pain (Bilateral) (L>R) 11/27/2016  . Lumbar facet hypertrophy (Multilevel) (Bilateral) 11/27/2016  . Long term current use of opiate analgesic 10/23/2016  . Long term prescription opiate use 10/23/2016  . Opiate use 10/23/2016  . Chronic pain syndrome 10/23/2016  . Cystocele 12/20/2012  . Dyssynergia 12/20/2012  . Fecal incontinence 12/20/2012  . Mixed  incontinence 12/20/2012  . Rectocele 12/20/2012  . Psoriasis 08/16/2012  . Psoriatic arthritis ([place]) 08/16/2012  . 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 GERD (gastroesophageal reflux disease) 05/24/2012  . Hidradenitis 05/24/2012  . Migraine 05/24/2012    Past Surgical History:  Procedure Laterality Date  . BREAST REDUCTION SURGERY    . COLONOSCOPY WITH ESOPHAGOGASTRODUODENOSCOPY (EGD)    . COLONOSCOPY WITH PROPOFOL N/A 05/19/2016   Procedure: COLONOSCOPY WITH PROPOFOL;  Surgeon: Lollie Sails, MD;  Location[facility] ENDOSCOPY;  Service: Endoscopy;  Laterality: N/A;  . foot spurs    . FRACTURE SURGERY    . MANDIBLE FRACTURE SURGERY    . TUBAL LIGATION      Prior to Admission medications   Medication Sig Start Date End Date Taking? Authorizing Provider  ALPRAZolam Duanne Moron) 1 MG tablet Take 1 mg by mouth 4 (four) times daily.    [provider]  amphetamine-dextroamphetamine (ADDERALL) 20 MG tablet Take 20 mg by mouth 2 (two) times daily.     [provider]  buPROPion (WELLBUTRIN XL) 300 MG 24 hr tablet Take 300 mg by mouth daily.    [provider]  clobetasol ointment (TEMOVATE) 6.97 % Apply 1 application 2 (two) times daily topically.    [provider]  clotrimazole-betamethasone (LOTRISONE) cream APP TO AFFECTED EAR ONCE OR TWICE WEEKLY PRN FOR SKIN IRRITATION. 11/09/16   [provider]  escitalopram (LEXAPRO) 20 MG tablet Take 30 mg by mouth daily.     [provider]  HYDROcodone-acetaminophen (NORCO/VICODIN) 5-325 MG tablet Take 1 tablet by mouth every 6 (six) hours as needed for moderate pain. 11/28/17   Johnn Hai, PA-C  ibuprofen (ADVIL,MOTRIN) 600 MG tablet Take 1 tablet (600 mg total) by mouth every 8 (eight) hours as needed. 10/04/17   Sable Feil, PA-C  omeprazole (PRILOSEC) 20 MG capsule Take 20 mg by mouth 2 (two) times daily before a meal.    [provider]     Allergies Topiramate  Family History  Problem Relation Age of Onset  . Liver cancer Mother   . Cancer Father     Social History Social History   Tobacco Use  . Smoking status: Current Some Day Smoker    Types: Cigarettes  . Smokeless tobacco: Never Used  Substance Use Topics  . Alcohol use: No  . Drug use: No    Review of Systems Constitutional: No fever/chills Cardiovascular: Denies chest pain. Respiratory: Denies shortness of breath. Musculoskeletal: Positive for left third toe pain. Skin: Positive for ecchymosis. Neurological: Negative for  focal weakness or numbness.   ____________________________________________   PHYSICAL EXAM:  VITAL SIGNS: ED Triage Vitals  Enc Vitals Group     BP 11/28/17 1419 (!) 142/73     Pulse Rate 11/28/17 1419 63     Resp 11/28/17 1419 15     Temp 11/28/17 1419 98.2 F (36.8 C)     Temp Source 11/28/17 1419 Oral     SpO2 11/28/17 1419 99 %     Weight 11/28/17 1420 165 lb (74.8 kg)     Height 11/28/17 1420 5\' 4"  (1.626 m)     Head Circumference --      Peak Flow --      Pain Score --      Pain Loc --      Pain Edu? --      Excl. in [place]? --    Constitutional: Alert and oriented. Well appearing and in no acute distress. Eyes: Conjunctivae are normal.  Head: Atraumatic. Neck: No stridor.   Cardiovascular: Normal rate, regular rhythm. Grossly normal heart sounds.  Good peripheral circulation. Respiratory: Normal respiratory effort.  No retractions. Lungs CTAB. Musculoskeletal: Examination of the left third toe there is moderate amount of soft tissue swelling. Neurologic:  Normal speech and language. No gross focal neurologic deficits are appreciated. No gait instability. Skin:  Skin is warm, dry and intact.  Mild ecchymosis noted left third toe.  Nail is not involved. Psychiatric: Mood and affect are normal. Speech and behavior are normal.  ____________________________________________   LABS (all labs ordered are  listed, but only abnormal results are displayed)  Labs Reviewed - No data to display  RADIOLOGY  ED MD interpretation:   Left foot x-ray is negative for fracture or dislocation.  Official radiology report(s): Dg Foot Complete Left  Result Date: 11/28/2017 CLINICAL DATA:  Pain of the left middle toe. EXAM: LEFT FOOT - COMPLETE 3+ VIEW COMPARISON:  None. FINDINGS: There is no evidence of fracture or dislocation. There is probable narrowed joint space and osteophyte formation of the third distal interphalangeal joint consistent with osteo arthritic change. Soft tissues are unremarkable. IMPRESSION: No acute fracture or dislocation. 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 Electronically Signed   By: Abelardo Diesel M.D.   On: 11/28/2017 15:09  ____________________________________________   PROCEDURES  Procedure(s) performed: None  Procedures  Critical Care performed: No  ____________________________________________   INITIAL IMPRESSION / ASSESSMENT AND PLAN / ED  COURSE  As part of my medical decision making, I reviewed the following data within the electronic MEDICAL RECORD NUMBER Notes from prior ED visits and [place] Controlled Substance Database  Patient was made aware that there was no fracture.  She is instructed to ice and elevate as needed for swelling.  She was given a Norco while waiting for test results.  Patient was placed in a wooden shoe for support and protection.  She is to follow-up with her PCP if any continued problems.  ____________________________________________   FINAL CLINICAL IMPRESSION(S) / ED DIAGNOSES  Final diagnoses:  Contusion of left lesser toe(s) without damage to nail, initial encounter     ED Discharge Orders        Ordered    HYDROcodone-acetaminophen (NORCO/VICODIN) 5-325 MG tablet  Every 6 hours PRN     11/28/17 1536       Note:  This document was prepared using Dragon voice recognition software and may  include unintentional dictation errors.    Johnn Hai, PA-C 11/28/17 1715    Darel Hong, MD 11/29/17 1012

## 2018-01-20 ENCOUNTER — Emergency Department: Payer: Medicaid Other

## 2018-01-20 ENCOUNTER — Emergency Department
Admission: EM | Admit: 2018-01-20 | Discharge: 2018-01-20 | Disposition: A | Discharge status: Home / Self Care | Payer: Medicaid Other | Attending: Emergency Medicine | Admitting: Emergency Medicine

## 2018-01-20 ENCOUNTER — Encounter: Payer: Self-pay | Admitting: Medical Oncology

## 2018-01-20 DIAGNOSIS — Y929 Unspecified place or not applicable: Secondary | ICD-10-CM | POA: Insufficient documentation

## 2018-01-20 DIAGNOSIS — Y939 Activity, unspecified: Secondary | ICD-10-CM | POA: Insufficient documentation

## 2018-01-20 DIAGNOSIS — W109XXA Fall (on) (from) unspecified stairs and steps, initial encounter: Secondary | ICD-10-CM | POA: Diagnosis not present

## 2018-01-20 DIAGNOSIS — S40011A Contusion of right shoulder, initial encounter: Secondary | ICD-10-CM | POA: Diagnosis not present

## 2018-01-20 DIAGNOSIS — F17210 Nicotine dependence, cigarettes, uncomplicated: Secondary | ICD-10-CM | POA: Insufficient documentation

## 2018-01-20 DIAGNOSIS — Y998 Other external cause status: Secondary | ICD-10-CM | POA: Insufficient documentation

## 2018-01-20 DIAGNOSIS — S4991XA Unspecified injury of right shoulder and upper arm, initial encounter: Secondary | ICD-10-CM | POA: Diagnosis present

## 2018-01-20 DIAGNOSIS — W19XXXA Unspecified fall, initial encounter: Secondary | ICD-10-CM

## 2018-01-20 DIAGNOSIS — Z79899 Other long term (current) drug therapy: Secondary | ICD-10-CM | POA: Insufficient documentation

## 2018-01-20 MED ORDER — HYDROCODONE-ACETAMINOPHEN 5-325 MG PO TABS
1.0000 | ORAL_TABLET | Freq: Once | ORAL | Status: AC
  Administered 2018-01-20: 1 via ORAL
  Filled 2018-01-20: qty 1

## 2018-01-20 MED ORDER — HYDROCODONE-ACETAMINOPHEN 5-325 MG PO TABS: 1.0000 | ORAL_TABLET | Freq: Four times a day (QID) | ORAL | 0 refills | Status: DC | PRN

## 2018-01-20 NOTE — ED Provider Notes (Signed)
[facility] Emergency Department Provider Note  ____________________________________________   First MD Initiated Contact with Patient 01/20/18 1419     (approximate)  I have reviewed the triage vital signs and the nursing notes.   HISTORY  Chief Complaint Shoulder Pain   HPI [name] is a 52 y.o. female presents to the ED with complaint of right shoulder pain after she fell last evening tripping down some steps.  She denies any head injury or loss of consciousness.  Patient states that she has not taken anything for her painful shoulder.  She denies any previous injury to her shoulder.  Today she continues to have pain.  Pain is increased with range of motion.  She denies any paresthesias into her right upper extremity.  She rates her pain as an 8 out of 10.   Past Medical History:  Diagnosis Date  . Anxiety   . Arthritis    PSORIATIC ARTHRITIS  . Colon polyp   . Depression   . Headache    MIGRAINE  . Ulcer     Patient Active Problem List   Diagnosis Date Noted  . Cocaine use 01/18/2017  . Vitamin D insufficiency 01/18/2017  . Long term prescription benzodiazepine use 11/27/2016  . Chronic low back pain (Primary Area of Pain) (Right) 11/27/2016  . Chronic lower extremity pain (Secondary Area of pain) (Right) 11/27/2016  . Chronic hip pain [facility] Area of Pain) (Right) 11/27/2016  . Chronic knee pain (Fourth Area of Pain) (Right) 11/27/2016  . Chronic foot pain (Fifth Area of Pain) (Right) 11/27/2016  . Chronic shoulder pain (Bilateral) (R>L) 11/27/2016  . Chronic neck pain (Right) 11/27/2016  . Atypical facial pain (Bilateral) (R>L) 11/27/2016  . History of TMJ syndrome 11/27/2016  . 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 Chronic sacroiliac joint pain (Bilateral) (L>R) 11/27/2016  . Lumbar facet hypertrophy (Multilevel) (Bilateral) 11/27/2016  . Long term current use of opiate analgesic 10/23/2016  . Long term  prescription opiate use 10/23/2016  . Opiate use 10/23/2016  . Chronic pain syndrome 10/23/2016  . Cystocele 12/20/2012  . Dyssynergia 12/20/2012  . Fecal incontinence 12/20/2012  . Mixed incontinence 12/20/2012  . Rectocele 12/20/2012  . Psoriasis 08/16/2012  . Psoriatic arthritis ([place]) 08/16/2012  . 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 GERD (gastroesophageal reflux disease) 05/24/2012  . Hidradenitis 05/24/2012  . Migraine 05/24/2012    Past Surgical History:  Procedure Laterality Date  . BREAST REDUCTION SURGERY    . COLONOSCOPY WITH ESOPHAGOGASTRODUODENOSCOPY (EGD)    . COLONOSCOPY WITH PROPOFOL N/A 05/19/2016   Procedure: COLONOSCOPY WITH PROPOFOL;  Surgeon: Lollie Sails, MD;  Location: [facility] ENDOSCOPY;  Service: Endoscopy;  Laterality: N/A;  . foot spurs    . FRACTURE SURGERY    . MANDIBLE FRACTURE SURGERY    . TUBAL LIGATION      Prior to Admission medications   Medication Sig Start Date End Date Taking? Authorizing Provider  ALPRAZolam Duanne Moron) 1 MG tablet Take 1 mg by mouth 4 (four) times daily.    [provider]  amphetamine-dextroamphetamine (ADDERALL) 20 MG tablet Take 20 mg by mouth 2 (two) times daily.     [provider]  buPROPion (WELLBUTRIN XL) 300 MG 24 hr tablet Take 300 mg by mouth daily.    [provider]  clobetasol ointment (TEMOVATE) 3.66 % Apply 1 application 2 (two) times daily topically.    [provider]  clotrimazole-betamethasone (LOTRISONE) cream APP TO AFFECTED  EAR ONCE OR TWICE WEEKLY PRN FOR SKIN IRRITATION. 11/09/16   [provider]  escitalopram (LEXAPRO) 20 MG tablet Take 30 mg by mouth daily.     [provider]  HYDROcodone-acetaminophen (NORCO/VICODIN) 5-325 MG tablet Take 1 tablet by mouth every 6 (six) hours as needed for moderate pain. 01/20/18   Johnn Hai, PA-C  omeprazole (PRILOSEC) 20 MG capsule Take 20 mg by mouth 2 (two) times daily before a meal.     [provider]    Allergies Topiramate  Family History  Problem Relation Age of Onset  . Liver cancer Mother   . Cancer Father     Social History Social History   Tobacco Use  . Smoking status: Current Some Day Smoker    Types: Cigarettes  . Smokeless tobacco: Never Used  Substance Use Topics  . Alcohol use: No  . Drug use: No    Review of Systems Constitutional: No fever/chills Eyes: No visual changes. ENT: No trauma. Cardiovascular: Denies chest pain. Respiratory: Denies shortness of breath. Gastrointestinal:   No nausea, no vomiting.  Musculoskeletal: Positive for right shoulder pain. Skin: Negative for rash. Neurological: Negative for headaches, focal weakness or numbness. ___________________________________________   PHYSICAL EXAM:  VITAL SIGNS: ED Triage Vitals  Enc Vitals Group     BP 01/20/18 1408 116/75     Pulse Rate 01/20/18 1408 (!) 56     Resp 01/20/18 1408 18     Temp 01/20/18 1408 98 F (36.7 C)     Temp Source 01/20/18 1408 Oral     SpO2 01/20/18 1408 96 %     Weight 01/20/18 1406 180 lb (81.6 kg)     Height 01/20/18 1406 5\' 4"  (1.626 m)     Head Circumference --      Peak Flow --      Pain Score 01/20/18 1406 8     Pain Loc --      Pain Edu? --      Excl. in [place]? --    Constitutional: Alert and oriented. Well appearing and in no acute distress. Eyes: Conjunctivae are normal. PERRL. EOMI. Head: Atraumatic. Nose: No congestion/rhinnorhea. Mouth/Throat: Mucous membranes are moist.  Oropharynx non-erythematous. Neck: No stridor.  Cervical tenderness on palpation posteriorly. Hematological/Lymphatic/Immunilogical: No cervical lymphadenopathy. Cardiovascular: Normal rate, regular rhythm. Grossly normal heart sounds.  Good peripheral circulation. Respiratory: Normal respiratory effort.  No retractions. Lungs CTAB. Musculoskeletal: Examination of the right shoulder there is no wrist deformity and no ecchymosis or abrasions were  seen.  There is minimal soft tissue swelling noted at the [facility] joint and deltoid region.  Range of motion is minimally restricted and no crepitus was appreciated.  Good muscle strength and pulses present distal to the injury.  Skin is intact. Neurologic:  Normal speech and language. No gross focal neurologic deficits are appreciated. No gait instability. Skin:  Skin is warm, dry and intact. Psychiatric: Mood and affect are normal. Speech and behavior are normal.  ____________________________________________   LABS (all labs ordered are listed, but only abnormal results are displayed)  Labs Reviewed - No data to display  RADIOLOGY  ED MD interpretation:   Right shoulder x-ray is negative for acute injury.  Official radiology report(s): Dg Shoulder Right  Result Date: 01/20/2018 CLINICAL DATA:  Right shoulder pain. EXAM: RIGHT SHOULDER - 2+ VIEW COMPARISON:  None. FINDINGS: There is no evidence of fracture or dislocation. There is no evidence of arthropathy or other focal bone abnormality. Soft tissues are  unremarkable. IMPRESSION: No acute osseous injury of the right shoulder. Electronically Signed   By: Kathreen Devoid   On: 01/20/2018 15:16   ____________________________________________   PROCEDURES  Procedure(s) performed: None  Procedures  Critical Care performed: No  ____________________________________________   INITIAL IMPRESSION / ASSESSMENT AND PLAN / ED COURSE  As part of my medical decision making, I reviewed the following data within the electronic MEDICAL RECORD NUMBER Notes from prior ED visits and [place] Controlled Substance Database  Patient presents to the ED with complaint of right shoulder pain after she fell last evening.  She denies any head injury or loss of consciousness with this and it was a mechanical fall rather than a syncopal episode.  Patient is continued to have shoulder pain.  She states she has not taken any over-the-counter medication has not had ice to the  area.  Patient was given Norco while in the department.  X-rays were negative and reassuring.  Patient was made aware and a prescription for Norco was given to her 1 every 6 hours if needed for pain.  She is to follow-up with her PCP if any continued problems. ____________________________________________   FINAL CLINICAL IMPRESSION(S) / ED DIAGNOSES  Final diagnoses:  Contusion of right shoulder, initial encounter  Fall, initial encounter     ED Discharge Orders         Ordered    HYDROcodone-acetaminophen (NORCO/VICODIN) 5-325 MG tablet  Every 6 hours PRN     01/20/18 1600           Note:  This document was prepared using Dragon voice recognition software and may include unintentional dictation errors.    Johnn Hai, PA-C 01/20/18 1604    Nena Polio, MD 01/20/18 [phone]

## 2018-01-20 NOTE — ED Triage Notes (Signed)
Pt reports that she tripped and fell down steps injuring her rt shoulder yesterday.

## 2018-01-20 NOTE — Discharge Instructions (Signed)
Follow-up with your primary care provider if any continued problems.  Use ice to your shoulder to help with pain.  Take Norco 1 every 6 hours as needed for pain.  You will need to follow-up with your primary care provider if any continued pain medication is required.

## 2018-03-05 ENCOUNTER — Encounter: Payer: Self-pay | Admitting: *Deleted

## 2018-03-06 ENCOUNTER — Ambulatory Visit: Payer: Medicaid Other | Admitting: Certified Registered Nurse Anesthetist

## 2018-03-06 ENCOUNTER — Ambulatory Visit
Admission: RE | Admit: 2018-03-06 | Discharge: 2018-03-06 | Disposition: A | Discharge status: Home / Self Care | Payer: Medicaid Other | Source: Ambulatory Visit | Attending: Internal Medicine | Admitting: Internal Medicine

## 2018-03-06 ENCOUNTER — Encounter
Admission: RE | Disposition: A | Discharge status: Home / Self Care | Payer: Self-pay | Source: Ambulatory Visit | Attending: Internal Medicine

## 2018-03-06 ENCOUNTER — Encounter: Payer: Self-pay | Admitting: *Deleted

## 2018-03-06 DIAGNOSIS — K635 Polyp of colon: Secondary | ICD-10-CM | POA: Insufficient documentation

## 2018-03-06 DIAGNOSIS — F319 Bipolar disorder, unspecified: Secondary | ICD-10-CM | POA: Insufficient documentation

## 2018-03-06 DIAGNOSIS — Z1211 Encounter for screening for malignant neoplasm of colon: Secondary | ICD-10-CM | POA: Insufficient documentation

## 2018-03-06 DIAGNOSIS — R1310 Dysphagia, unspecified: Secondary | ICD-10-CM | POA: Insufficient documentation

## 2018-03-06 DIAGNOSIS — K224 Dyskinesia of esophagus: Secondary | ICD-10-CM | POA: Diagnosis not present

## 2018-03-06 DIAGNOSIS — K219 Gastro-esophageal reflux disease without esophagitis: Secondary | ICD-10-CM | POA: Insufficient documentation

## 2018-03-06 DIAGNOSIS — K640 First degree hemorrhoids: Secondary | ICD-10-CM | POA: Diagnosis not present

## 2018-03-06 DIAGNOSIS — Z86010 Personal history of colonic polyps: Secondary | ICD-10-CM | POA: Insufficient documentation

## 2018-03-06 DIAGNOSIS — F17200 Nicotine dependence, unspecified, uncomplicated: Secondary | ICD-10-CM | POA: Diagnosis not present

## 2018-03-06 DIAGNOSIS — D123 Benign neoplasm of transverse colon: Secondary | ICD-10-CM | POA: Insufficient documentation

## 2018-03-06 DIAGNOSIS — F419 Anxiety disorder, unspecified: Secondary | ICD-10-CM | POA: Diagnosis not present

## 2018-03-06 DIAGNOSIS — Z79899 Other long term (current) drug therapy: Secondary | ICD-10-CM | POA: Diagnosis not present

## 2018-03-06 DIAGNOSIS — L4050 Arthropathic psoriasis, unspecified: Secondary | ICD-10-CM | POA: Diagnosis not present

## 2018-03-06 DIAGNOSIS — K2970 Gastritis, unspecified, without bleeding: Secondary | ICD-10-CM | POA: Insufficient documentation

## 2018-03-06 HISTORY — PX: COLONOSCOPY WITH PROPOFOL: SHX5780

## 2018-03-06 HISTORY — PX: ESOPHAGOGASTRODUODENOSCOPY (EGD) WITH PROPOFOL: SHX5813

## 2018-03-06 LAB — KOH PREP
KOH Prep: NONE SEEN
Special Requests: NORMAL

## 2018-03-06 SURGERY — COLONOSCOPY WITH PROPOFOL
Anesthesia: General

## 2018-03-06 MED ORDER — LIDOCAINE HCL (CARDIAC) PF 100 MG/5ML IV SOSY
PREFILLED_SYRINGE | INTRAVENOUS | Status: DC | PRN
  Administered 2018-03-06: 100 mg via INTRAVENOUS

## 2018-03-06 MED ORDER — PROPOFOL 10 MG/ML IV BOLUS
INTRAVENOUS | Status: AC
  Filled 2018-03-06: qty 20

## 2018-03-06 MED ORDER — PROPOFOL 500 MG/50ML IV EMUL
INTRAVENOUS | Status: DC | PRN
  Administered 2018-03-06: 140 ug/kg/min via INTRAVENOUS

## 2018-03-06 MED ORDER — FENTANYL CITRATE (PF) 100 MCG/2ML IJ SOLN
INTRAMUSCULAR | Status: DC | PRN
  Administered 2018-03-06: 100 ug via INTRAVENOUS

## 2018-03-06 MED ORDER — PROPOFOL 10 MG/ML IV BOLUS
INTRAVENOUS | Status: DC | PRN
  Administered 2018-03-06: 20 mg via INTRAVENOUS
  Administered 2018-03-06: 10 mg via INTRAVENOUS
  Administered 2018-03-06 (×2): 20 mg via INTRAVENOUS
  Administered 2018-03-06: 50 mg via INTRAVENOUS
  Administered 2018-03-06: 10 mg via INTRAVENOUS

## 2018-03-06 MED ORDER — FENTANYL CITRATE (PF) 100 MCG/2ML IJ SOLN
INTRAMUSCULAR | Status: AC
  Filled 2018-03-06: qty 2

## 2018-03-06 MED ORDER — SODIUM CHLORIDE 0.9 % IV SOLN
INTRAVENOUS | Status: DC
  Administered 2018-03-06 (×2): via INTRAVENOUS

## 2018-03-06 NOTE — H&P (Addendum)
Outpatient short stay form Pre-procedure 03/06/2018 11:14 AM Teodoro K. Alice Reichert, M.D.  Primary Physician: Delight Stare, M.D.  Reason for visit:  GERD, Dysphagia, personal hx of colon polyps.  History of present illness: Patient with a hx of GERD controlled on PPI bid presents for intermittent dysphagia. Dysphagia mainly to pills and is improved with BID PPI.                            Patient presents for colonoscopy for a personal hx of colon polyps. The patient denies abdominal pain, abnormal weight loss or rectal bleeding.    No current facility-administered medications for this encounter.   Current Outpatient Medications:  .  cyclobenzaprine (FLEXERIL) 10 MG tablet, Take 10 mg by mouth 3 (three) times daily as needed for muscle spasms., Disp: , Rfl:  .  diclofenac (FLECTOR) 1.3 % PTCH, Place 1 patch onto the skin as needed., Disp: , Rfl:  .  diclofenac sodium (VOLTAREN) 1 % GEL, Apply topically as needed., Disp: , Rfl:  .  etodolac (LODINE) 500 MG tablet, Take 500 mg by mouth 2 (two) times daily., Disp: , Rfl:  .  fluocinonide (LIDEX) 0.05 % external solution, Apply 1 application topically as needed., Disp: , Rfl:  .  fluticasone (FLONASE) 50 MCG/ACT nasal spray, Place into both nostrils daily., Disp: , Rfl:  .  OXYCODONE HCL PO, Take 10 mg by mouth 3 (three) times daily., Disp: , Rfl:  .  ALPRAZolam (XANAX) 1 MG tablet, Take 1 mg by mouth 4 (four) times daily., Disp: , Rfl:  .  amphetamine-dextroamphetamine (ADDERALL) 20 MG tablet, Take 20 mg by mouth 2 (two) times daily. , Disp: , Rfl:  .  buPROPion (WELLBUTRIN XL) 300 MG 24 hr tablet, Take 300 mg by mouth daily., Disp: , Rfl:  .  clobetasol ointment (TEMOVATE) 4.09 %, Apply 1 application 2 (two) times daily topically., Disp: , Rfl:  .  clotrimazole-betamethasone (LOTRISONE) cream, APP TO AFFECTED EAR ONCE OR TWICE WEEKLY PRN FOR SKIN IRRITATION., Disp: , Rfl: 6 .  escitalopram (LEXAPRO) 20 MG tablet, Take 30 mg by mouth daily. , Disp:  , Rfl:  .  HYDROcodone-acetaminophen (NORCO/VICODIN) 5-325 MG tablet, Take 1 tablet by mouth every 6 (six) hours as needed for moderate pain., Disp: 12 tablet, Rfl: 0 .  omeprazole (PRILOSEC) 40 MG capsule, Take 20 mg by mouth 2 (two) times daily before a meal. , Disp: , Rfl:   No medications prior to admission.     Allergies  Allergen Reactions  . Topiramate Hives     Past Medical History:  Diagnosis Date  . Anxiety   . Arthritis    PSORIATIC ARTHRITIS  . Colon polyp   . Depression   . Headache    MIGRAINE  . Ulcer     Review of systems:  Otherwise negative.    Physical Exam  Gen: Alert, oriented. Appears stated age.  HEENT: [place]/AT. PERRLA. Lungs: CTA, no wheezes. CV: RR nl S1, S2. Abd: soft, benign, no masses. BS+ Ext: No edema. Pulses 2+    Planned procedures: Proceed with EGD and colonoscopy. The patient understands the nature of the planned procedure, indications, risks, alternatives and potential complications including but not limited to bleeding, infection, perforation, damage to internal organs and possible oversedation/side effects from anesthesia. The patient agrees and gives consent to proceed.  Please refer to procedure notes for findings, recommendations and patient disposition/instructions.     [employer]  K. Alice Reichert, M.D. Gastroenterology 03/06/2018  11:14 AM

## 2018-03-06 NOTE — Op Note (Signed)
[facility] Gastroenterology Patient Name: [name] Procedure Date: 03/06/2018 1:32 PM MRN: [id number] Account #: [id number] Date of Birth: [birth year] Admit Type: Outpatient Age: 52 Room: [facility] ENDO ROOM 3 Gender: Female Note Status: Finalized Procedure:            Upper GI endoscopy Indications:          Dysphagia, Esophageal reflux Providers:            Benay Pike. Alice Reichert MD, MD Referring MD:         Marguerita Merles, MD (Referring MD) Medicines:            Propofol per Anesthesia Complications:        No immediate complications. Procedure:            Pre-Anesthesia Assessment:                       - The risks and benefits of the procedure and the                        sedation options and risks were discussed with the                        patient. All questions were answered and informed                        consent was obtained.                       - Patient identification and proposed procedure were                        verified prior to the procedure by the nurse. The                        procedure was verified in the procedure room.                       - After reviewing the risks and benefits, the patient                        was deemed in satisfactory condition to undergo the                        procedure.                       - ASA Grade Assessment: III - A patient with severe                        systemic disease.                       After obtaining informed consent, the endoscope was                        passed under direct vision. Throughout the procedure,                        the patient's blood pressure, pulse, and oxygen  saturations were monitored continuously. The Endoscope                        was introduced through the mouth, and advanced to the                        third part of duodenum. The upper GI endoscopy was                        accomplished without difficulty. The patient tolerated                         the procedure well. Findings:      Localized, yellow plaques were found in the lower third of the       esophagus. Cells for cytology were obtained by brushing.      Abnormal motility was noted at the gastroesophageal junction. The       cricopharyngeus was abnormal. There is spasticity of the esophageal       body. The distal esophagus/lower esophageal sphincter is spastic, but       gives up passage to the endoscope. The scope was withdrawn. Dilation was       performed with a Maloney dilator with no resistance at 70 Fr.      Localized mild inflammation characterized by erythema was found in the       gastric antrum.      The cardia and gastric fundus were normal on retroflexion.      The examined duodenum was normal.      The Z-line was irregular and was found at the gastroesophageal junction.       Mucosa was biopsied with a cold forceps for histology. One specimen       bottle was sent to pathology. Impression:           - Esophageal plaques were found, suspicious for                        candidiasis. Cells for cytology obtained.                       - Abnormal esophageal motility. Dilated.                       - Gastritis.                       - Normal examined duodenum. Recommendation:       - Await pathology results.                       - Monitor results to esophageal dilation                       - Proceed with colonoscopy Procedure Code(s):    --- Professional ---                       [id number], Esophagogastroduodenoscopy, flexible, transoral;                        with biopsy, single or multiple                       43450,  Dilation of esophagus, by unguided sound or                        bougie, single or multiple passes Diagnosis Code(s):    --- Professional ---                       K21.9, Gastro-esophageal reflux disease without                        esophagitis                       R13.10, Dysphagia, unspecified                        K29.70, Gastritis, unspecified, without bleeding                       K22.4, Dyskinesia of esophagus                       K22.9, Disease of esophagus, unspecified CPT copyright 2018 American Medical Association. All rights reserved. The codes documented in this report are preliminary and upon coder review may  be revised to meet current compliance requirements. Efrain Sella MD, MD 03/06/2018 1:51:01 PM This report has been signed electronically. Number of Addenda: 0 Note Initiated On: 03/06/2018 1:32 PM      [facility]

## 2018-03-06 NOTE — Anesthesia Preprocedure Evaluation (Signed)
Anesthesia Evaluation  Patient identified by MRN, date of birth, ID band Patient awake    Reviewed: Allergy & Precautions, NPO status , Patient's Chart, lab work & pertinent test results  History of Anesthesia Complications Negative for: history of anesthetic complications  Airway Mallampati: II  TM Distance: >3 FB Neck ROM: Full    Dental no notable dental hx.    Pulmonary neg sleep apnea, neg COPD, Current Smoker,    breath sounds clear to auscultation- rhonchi (-) wheezing      Cardiovascular Exercise Tolerance: Good (-) hypertension(-) CAD, (-) Past MI, (-) Cardiac Stents and (-) CABG  Rhythm:Regular Rate:Normal - Systolic murmurs and - Diastolic murmurs    Neuro/Psych  Headaches, PSYCHIATRIC DISORDERS Anxiety Depression Bipolar Disorder    GI/Hepatic Neg liver ROS, GERD  ,  Endo/Other  negative endocrine ROSneg diabetes  Renal/GU negative Renal ROS     Musculoskeletal  (+) Arthritis ,   Abdominal (+) + obese,   Peds  Hematology negative hematology ROS (+)   Anesthesia Other Findings Past Medical History: No date: Anxiety No date: Arthritis     Comment:  PSORIATIC ARTHRITIS No date: Colon polyp No date: Depression No date: Headache     Comment:  MIGRAINE No date: Ulcer   Reproductive/Obstetrics                             Anesthesia Physical Anesthesia Plan  ASA: II  Anesthesia Plan: General   Post-op Pain Management:    Induction: Intravenous  PONV Risk Score and Plan: 1 and Propofol infusion  Airway Management Planned: Natural Airway  Additional Equipment:   Intra-op Plan:   Post-operative Plan:   Informed Consent: I have reviewed the patients History and Physical, chart, labs and discussed the procedure including the risks, benefits and alternatives for the proposed anesthesia with the patient or authorized representative who has indicated his/her understanding  and acceptance.   Dental advisory given  Plan Discussed with: CRNA and Anesthesiologist  Anesthesia Plan Comments:         Anesthesia Quick Evaluation

## 2018-03-06 NOTE — Interval H&P Note (Signed)
History and Physical Interval Note:  03/06/2018 1:22 PM  [name]  has presented today for surgery, with the diagnosis of PHCP GERD  The various methods of treatment have been discussed with the patient and family. After consideration of risks, benefits and other options for treatment, the patient has consented to  Procedure(s): COLONOSCOPY WITH PROPOFOL (N/A) ESOPHAGOGASTRODUODENOSCOPY (EGD) WITH PROPOFOL (N/A) as a surgical intervention .  The patient's history has been reviewed, patient examined, no change in status, stable for surgery.  I have reviewed the patient's chart and labs.  Questions were answered to the patient's satisfaction.     [place], [place]

## 2018-03-06 NOTE — Op Note (Signed)
[facility] Gastroenterology Patient Name: [name] Procedure Date: 03/06/2018 1:31 PM MRN: [id number] Account #: [id number] Date of Birth: November 19, 1965 Admit Type: Outpatient Age: 52 Room: [facility] ENDO ROOM 3 Gender: Female Note Status: Finalized Procedure:            Colonoscopy Indications:          High risk colon cancer surveillance: Personal history                        of colonic polyps Providers:            Benay Pike. Alice Reichert MD, MD Referring MD:         Marguerita Merles, MD (Referring MD) Medicines:            Propofol per Anesthesia Complications:        No immediate complications. Procedure:            Pre-Anesthesia Assessment:                       - The risks and benefits of the procedure and the                        sedation options and risks were discussed with the                        patient. All questions were answered and informed                        consent was obtained.                       - Patient identification and proposed procedure were                        verified prior to the procedure by the nurse. The                        procedure was verified in the procedure room.                       - ASA Grade Assessment: III - A patient with severe                        systemic disease.                       - After reviewing the risks and benefits, the patient                        was deemed in satisfactory condition to undergo the                        procedure.                       After obtaining informed consent, the colonoscope was                        passed under direct vision. Throughout the procedure,  the patient's blood pressure, pulse, and oxygen                        saturations were monitored continuously. The                        Colonoscope was introduced through the anus and                        advanced to the the cecum, identified by appendiceal   orifice and ileocecal valve. The colonoscopy was                        performed without difficulty. The patient tolerated the                        procedure well. The quality of the bowel preparation                        was good. Findings:      The perianal and digital rectal examinations were normal. Pertinent       negatives include normal sphincter tone and no palpable rectal lesions.      Two sessile polyps were found in the sigmoid colon and transverse colon.       The polyps were 3 to 4 mm in size. These polyps were removed with a       jumbo cold forceps. Resection and retrieval were complete.      Non-bleeding internal hemorrhoids were found during retroflexion. The       hemorrhoids were Grade I (internal hemorrhoids that do not prolapse).      The exam was otherwise without abnormality. Impression:           - Two 3 to 4 mm polyps in the sigmoid colon and in the                        transverse colon, removed with a jumbo cold forceps.                        Resected and retrieved.                       - Non-bleeding internal hemorrhoids.                       - The examination was otherwise normal. Recommendation:       - Patient has a contact number available for                        emergencies. The signs and symptoms of potential                        delayed complications were discussed with the patient.                        Return to normal activities tomorrow. Written discharge                        instructions were provided to the patient.                       -  Resume previous diet.                       - Continue present medications.                       - Repeat colonoscopy is recommended for surveillance.                        The colonoscopy date will be determined after pathology                        results from today's exam become available for review.                       - Monitor results to esophageal dilation                       -  Await pathology results from EGD, also performed                        today.                       - Return to physician assistant in 3 months.                       - The findings and recommendations were discussed with                        the patient and their family. Procedure Code(s):    --- Professional ---                       [phone], Colonoscopy, flexible; with biopsy, single or                        multiple Diagnosis Code(s):    --- Professional ---                       K64.0, First degree hemorrhoids                       D12.3, Benign neoplasm of transverse colon (hepatic                        flexure or splenic flexure)                       D12.5, Benign neoplasm of sigmoid colon                       Z86.010, Personal history of colonic polyps CPT copyright 2018 American Medical Association. All rights reserved. The codes documented in this report are preliminary and upon coder review may  be revised to meet current compliance requirements. Efrain Sella MD, MD 03/06/2018 2:20:17 PM This report has been signed electronically. Number of Addenda: 0 Note Initiated On: 03/06/2018 1:31 PM Scope Withdrawal Time: 0 hours 5 minutes 44 seconds  Total Procedure Duration: 0 hours 20 minutes 30 seconds       [facility]

## 2018-03-06 NOTE — Anesthesia Post-op Follow-up Note (Signed)
Anesthesia QCDR form completed.        

## 2018-03-06 NOTE — Anesthesia Postprocedure Evaluation (Signed)
Anesthesia Post Note  Patient: [name]  Procedure(s) Performed: COLONOSCOPY WITH PROPOFOL (N/A ) ESOPHAGOGASTRODUODENOSCOPY (EGD) WITH PROPOFOL (N/A )  Patient location during evaluation: PACU Anesthesia Type: General Level of consciousness: awake and alert and oriented Pain management: pain level controlled Vital Signs Assessment: post-procedure vital signs reviewed and stable Respiratory status: spontaneous breathing, nonlabored ventilation and respiratory function stable Cardiovascular status: blood pressure returned to baseline and stable Postop Assessment: no signs of nausea or vomiting Anesthetic complications: no     Last Vitals:  Vitals:   03/06/18 1426 03/06/18 1436  BP: (!) 165/93 (!) 155/78  Pulse: 61 (!) 54  Resp: 15 10  Temp:    SpO2: 100% 100%    Last Pain:  Vitals:   03/06/18 1436  TempSrc:   PainSc: 0-No pain                 Orlyn Odonoghue

## 2018-03-06 NOTE — Transfer of Care (Signed)
Immediate Anesthesia Transfer of Care Note  Patient: Anquanette Bahner  Procedure(s) Performed: COLONOSCOPY WITH PROPOFOL (N/A ) ESOPHAGOGASTRODUODENOSCOPY (EGD) WITH PROPOFOL (N/A )  Patient Location: PACU  Anesthesia Type:General  Level of Consciousness: drowsy  Airway & Oxygen Therapy: Patient Spontanous Breathing and Patient connected to nasal cannula oxygen  Post-op Assessment: Report given to RN and Post -op Vital signs reviewed and stable  Post vital signs: Reviewed and stable  Last Vitals:  Vitals Value Taken Time  BP 142/89 03/06/2018  2:17 PM  Temp 36.1 C 03/06/2018  2:16 PM  Pulse 59 03/06/2018  2:17 PM  Resp 16 03/06/2018  2:17 PM  SpO2 100 % 03/06/2018  2:17 PM  Vitals shown include unvalidated device data.  Last Pain:  Vitals:   03/06/18 1416  TempSrc: Tympanic         Complications: No apparent anesthesia complications

## 2018-03-06 NOTE — Anesthesia Procedure Notes (Signed)
Performed by: Jesus Poplin, CRNA Pre-anesthesia Checklist: Patient identified, Emergency Drugs available, Suction available, Patient being monitored and Timeout performed Patient Re-evaluated:Patient Re-evaluated prior to induction Oxygen Delivery Method: Nasal cannula Induction Type: IV induction       

## 2018-03-07 ENCOUNTER — Encounter: Payer: Self-pay | Admitting: Internal Medicine

## 2018-03-08 LAB — SURGICAL PATHOLOGY

## 2018-04-10 ENCOUNTER — Emergency Department
Admission: EM | Admit: 2018-04-10 | Discharge: 2018-04-10 | Disposition: A | Discharge status: Home / Self Care | Payer: Medicaid Other | Attending: Emergency Medicine | Admitting: Emergency Medicine

## 2018-04-10 ENCOUNTER — Emergency Department: Payer: Medicaid Other

## 2018-04-10 ENCOUNTER — Other Ambulatory Visit: Payer: Self-pay

## 2018-04-10 ENCOUNTER — Encounter: Payer: Self-pay | Admitting: Emergency Medicine

## 2018-04-10 DIAGNOSIS — M79642 Pain in left hand: Secondary | ICD-10-CM | POA: Diagnosis not present

## 2018-04-10 DIAGNOSIS — Z5321 Procedure and treatment not carried out due to patient leaving prior to being seen by health care provider: Secondary | ICD-10-CM | POA: Insufficient documentation

## 2018-04-10 NOTE — ED Triage Notes (Signed)
Pt reports that she jammed her left hand on some cabinets today. No deformity or dislocation seen. Pulses present

## 2018-04-15 ENCOUNTER — Other Ambulatory Visit: Payer: Self-pay

## 2018-04-15 ENCOUNTER — Emergency Department
Admission: EM | Admit: 2018-04-15 | Discharge: 2018-04-15 | Disposition: A | Discharge status: Home / Self Care | Payer: Medicaid Other | Attending: Emergency Medicine | Admitting: Emergency Medicine

## 2018-04-15 ENCOUNTER — Encounter: Payer: Self-pay | Admitting: *Deleted

## 2018-04-15 DIAGNOSIS — S63602A Unspecified sprain of left thumb, initial encounter: Secondary | ICD-10-CM | POA: Diagnosis not present

## 2018-04-15 DIAGNOSIS — F1490 Cocaine use, unspecified, uncomplicated: Secondary | ICD-10-CM | POA: Diagnosis not present

## 2018-04-15 DIAGNOSIS — W228XXA Striking against or struck by other objects, initial encounter: Secondary | ICD-10-CM | POA: Diagnosis not present

## 2018-04-15 DIAGNOSIS — F419 Anxiety disorder, unspecified: Secondary | ICD-10-CM | POA: Diagnosis not present

## 2018-04-15 DIAGNOSIS — F329 Major depressive disorder, single episode, unspecified: Secondary | ICD-10-CM | POA: Insufficient documentation

## 2018-04-15 DIAGNOSIS — Y9389 Activity, other specified: Secondary | ICD-10-CM | POA: Diagnosis not present

## 2018-04-15 DIAGNOSIS — S6992XA Unspecified injury of left wrist, hand and finger(s), initial encounter: Secondary | ICD-10-CM | POA: Diagnosis present

## 2018-04-15 DIAGNOSIS — Y998 Other external cause status: Secondary | ICD-10-CM | POA: Diagnosis not present

## 2018-04-15 DIAGNOSIS — Z79899 Other long term (current) drug therapy: Secondary | ICD-10-CM | POA: Insufficient documentation

## 2018-04-15 DIAGNOSIS — Y929 Unspecified place or not applicable: Secondary | ICD-10-CM | POA: Diagnosis not present

## 2018-04-15 DIAGNOSIS — F17210 Nicotine dependence, cigarettes, uncomplicated: Secondary | ICD-10-CM | POA: Diagnosis not present

## 2018-04-15 NOTE — ED Notes (Signed)
See triage note. L radial pulse 2+. Appropriate color and warmth.

## 2018-04-15 NOTE — Discharge Instructions (Addendum)
Follow-up with orthopedics if not better in 5 to 7 days.  Wear the splint for several days.  Take Tylenol and ibuprofen for pain as needed.

## 2018-04-15 NOTE — ED Provider Notes (Signed)
[facility] Emergency Department Provider Note  ____________________________________________   First MD Initiated Contact with Patient 04/15/18 1901     (approximate)  I have reviewed the triage vital signs and the nursing notes.   HISTORY  Chief Complaint Hand Injury    HPI [name] is a 51 y.o. female presents emergency department complaining of left thumb pain.  She states she jammed it on a table.  She denies any other injuries.  She states just feels like it is jammed but not broken.    Past Medical History:  Diagnosis Date  . Anxiety   . Arthritis    PSORIATIC ARTHRITIS  . Colon polyp   . Depression   . Headache    MIGRAINE  . Ulcer     Patient Active Problem List   Diagnosis Date Noted  . Cocaine use 01/18/2017  . Vitamin D insufficiency 01/18/2017  . Long term prescription benzodiazepine use 11/27/2016  . Chronic low back pain (Primary Area of Pain) (Right) 11/27/2016  . Chronic lower extremity pain (Secondary Area of pain) (Right) 11/27/2016  . Chronic hip pain [facility] Area of Pain) (Right) 11/27/2016  . Chronic knee pain (Fourth Area of Pain) (Right) 11/27/2016  . Chronic foot pain (Fifth Area of Pain) (Right) 11/27/2016  . Chronic shoulder pain (Bilateral) (R>L) 11/27/2016  . Chronic neck pain (Right) 11/27/2016  . Atypical facial pain (Bilateral) (R>L) 11/27/2016  . History of TMJ syndrome 11/27/2016  . 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 Chronic sacroiliac joint pain (Bilateral) (L>R) 11/27/2016  . Lumbar facet hypertrophy (Multilevel) (Bilateral) 11/27/2016  . Long term current use of opiate analgesic 10/23/2016  . Long term prescription opiate use 10/23/2016  . Opiate use 10/23/2016  . Chronic pain syndrome 10/23/2016  . Cystocele 12/20/2012  . Dyssynergia 12/20/2012  . Fecal incontinence 12/20/2012  . Mixed incontinence 12/20/2012  . Rectocele 12/20/2012  . Psoriasis 08/16/2012  .  Psoriatic arthritis ([place]) 08/16/2012  . 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 GERD (gastroesophageal reflux disease) 05/24/2012  . Hidradenitis 05/24/2012  . Migraine 05/24/2012    Past Surgical History:  Procedure Laterality Date  . BREAST REDUCTION SURGERY    . COLONOSCOPY WITH ESOPHAGOGASTRODUODENOSCOPY (EGD)    . COLONOSCOPY WITH PROPOFOL N/A 05/19/2016   Procedure: COLONOSCOPY WITH PROPOFOL;  Surgeon: Lollie Sails, MD;  Location: [facility] ENDOSCOPY;  Service: Endoscopy;  Laterality: N/A;  . COLONOSCOPY WITH PROPOFOL N/A 03/06/2018   Procedure: COLONOSCOPY WITH PROPOFOL;  Surgeon: Toledo, Benay Pike, MD;  Location: [facility] ENDOSCOPY;  Service: Gastroenterology;  Laterality: N/A;  . ESOPHAGOGASTRODUODENOSCOPY (EGD) WITH PROPOFOL N/A 03/06/2018   Procedure: ESOPHAGOGASTRODUODENOSCOPY (EGD) WITH PROPOFOL;  Surgeon: Toledo, Benay Pike, MD;  Location: [facility] ENDOSCOPY;  Service: Gastroenterology;  Laterality: N/A;  . foot spurs    . FRACTURE SURGERY    . MANDIBLE FRACTURE SURGERY    . TUBAL LIGATION      Prior to Admission medications   Medication Sig Start Date End Date Taking? Authorizing Provider  ALPRAZolam Duanne Moron) 1 MG tablet Take 1 mg by mouth 4 (four) times daily.    [provider]  amphetamine-dextroamphetamine (ADDERALL) 20 MG tablet Take 20 mg by mouth 2 (two) times daily.     [provider]  buPROPion (WELLBUTRIN XL) 300 MG 24 hr tablet Take 300 mg by mouth daily.    [provider]  clobetasol ointment (TEMOVATE) 8.24 % Apply 1 application 2 (two) times daily topically.  [provider]  clotrimazole-betamethasone (LOTRISONE) cream APP TO AFFECTED EAR ONCE OR TWICE WEEKLY PRN FOR SKIN IRRITATION. 11/09/16   [provider]  cyclobenzaprine (FLEXERIL) 10 MG tablet Take 10 mg by mouth 3 (three) times daily as needed for muscle spasms.    [provider]  diclofenac (FLECTOR) 1.3 % PTCH Place 1 patch onto the  skin as needed.    [provider]  diclofenac sodium (VOLTAREN) 1 % GEL Apply topically as needed.    [provider]  escitalopram (LEXAPRO) 20 MG tablet Take 30 mg by mouth daily.     [provider]  etodolac (LODINE) 500 MG tablet Take 500 mg by mouth 2 (two) times daily.    [provider]  fluocinonide (LIDEX) 0.05 % external solution Apply 1 application topically as needed.    [provider]  fluticasone (FLONASE) 50 MCG/ACT nasal spray Place into both nostrils daily.    [provider]  HYDROcodone-acetaminophen (NORCO/VICODIN) 5-325 MG tablet Take 1 tablet by mouth every 6 (six) hours as needed for moderate pain. Patient not taking: Reported on 03/06/2018 01/20/18   Johnn Hai, PA-C  omeprazole (PRILOSEC) 40 MG capsule Take 20 mg by mouth 2 (two) times daily before a meal.     [provider]  OXYCODONE HCL PO Take 10 mg by mouth 3 (three) times daily.    [provider]    Allergies Topiramate  Family History  Problem Relation Age of Onset  . Liver cancer Mother   . Liver disease Mother   . Cirrhosis Mother   . Alcohol abuse Mother   . Cancer Father   . Colon cancer Maternal Aunt     Social History Social History   Tobacco Use  . Smoking status: Current Some Day Smoker    Types: Cigarettes  . Smokeless tobacco: Never Used  Substance Use Topics  . Alcohol use: Never    Frequency: Never  . Drug use: No    Review of Systems  Constitutional: No fever/chills Eyes: No visual changes. ENT: No sore throat. Respiratory: Denies cough Genitourinary: Negative for dysuria. Musculoskeletal: Negative for back pain.  Positive for left thumb pain Skin: Negative for rash.    ____________________________________________   PHYSICAL EXAM:  VITAL SIGNS: ED Triage Vitals  Enc Vitals Group     BP 04/15/18 1828 (!) 160/91     Pulse Rate 04/15/18 1828 68     Resp --      Temp 04/15/18 1828  98.5 F (36.9 C)     Temp Source 04/15/18 1828 Oral     SpO2 04/15/18 1828 97 %     Weight 04/15/18 1829 175 lb (79.4 kg)     Height 04/15/18 1829 5\' 5"  (1.651 m)     Head Circumference --      Peak Flow --      Pain Score 04/15/18 1852 7     Pain Loc --      Pain Edu? --      Excl. in [place]? --     Constitutional: Alert and oriented. Well appearing and in no acute distress. Eyes: Conjunctivae are normal.  Head: Atraumatic. Nose: No congestion/rhinnorhea. Mouth/Throat: Mucous membranes are moist.   Neck:  supple no lymphadenopathy noted Cardiovascular: Normal rate, regular rhythm. Respiratory: Normal respiratory effort.  No retractions, GU: deferred Musculoskeletal: FROM all extremities, warm and well perfused, the left thumb is minimally if any tender, full range of motion, snuffbox is not  tender, neurovascular is intact Neurologic:  Normal speech and language.  Skin:  Skin is warm, dry and intact. No rash noted. Psychiatric: Mood and affect are normal. Speech and behavior are normal.  ____________________________________________   LABS (all labs ordered are listed, but only abnormal results are displayed)  Labs Reviewed - No data to display ____________________________________________   ____________________________________________  RADIOLOGY  X-ray was refused by the patient  ____________________________________________   PROCEDURES  Procedure(s) performed: Velcro thumb spica splint was applied  Procedures    ____________________________________________   INITIAL IMPRESSION / ASSESSMENT AND PLAN / ED COURSE  Pertinent labs & imaging results that were available during my care of the patient were reviewed by me and considered in my medical decision making (see chart for details).   Patient is a 52 year old female presents emergency department complaining of left thumb pain.  Physical exam shows a minimally tender left thumb.  When I discussed doing an  x-ray the patient refused x-ray.  She was placed in a thumb spica Velcro splint.  She is to follow-up with her regular doctor or orthopedics if not better in 3 to 5 days.  She states she understands will comply.  She was discharged in stable condition.     As part of my medical decision making, I reviewed the following data within the [place] notes reviewed and incorporated, Old chart reviewed, Notes from prior ED visits and [place] Controlled Substance Database  ____________________________________________   FINAL CLINICAL IMPRESSION(S) / ED DIAGNOSES  Final diagnoses:  Sprain of left thumb, unspecified site of finger, initial encounter      NEW MEDICATIONS STARTED DURING THIS VISIT:  New Prescriptions   No medications on file     Note:  This document was prepared using Dragon voice recognition software and may include unintentional dictation errors.    Versie Starks, PA-C 04/15/18 Ignacia Palma, MD 04/15/18 [phone]

## 2018-04-15 NOTE — ED Triage Notes (Signed)
Pain to  Left hand after hitting hand on a metal sink

## 2018-10-02 ENCOUNTER — Emergency Department
Admission: EM | Admit: 2018-10-02 | Discharge: 2018-10-03 | Disposition: A | Discharge status: Home / Self Care | Payer: Medicaid Other | Attending: Emergency Medicine | Admitting: Emergency Medicine

## 2018-10-02 ENCOUNTER — Other Ambulatory Visit: Payer: Self-pay

## 2018-10-02 ENCOUNTER — Encounter: Payer: Self-pay | Admitting: *Deleted

## 2018-10-02 DIAGNOSIS — F17210 Nicotine dependence, cigarettes, uncomplicated: Secondary | ICD-10-CM | POA: Insufficient documentation

## 2018-10-02 DIAGNOSIS — M542 Cervicalgia: Secondary | ICD-10-CM | POA: Diagnosis present

## 2018-10-02 DIAGNOSIS — M62838 Other muscle spasm: Secondary | ICD-10-CM | POA: Diagnosis not present

## 2018-10-02 DIAGNOSIS — Z79899 Other long term (current) drug therapy: Secondary | ICD-10-CM | POA: Insufficient documentation

## 2018-10-02 MED ORDER — DIAZEPAM 5 MG PO TABS
5.0000 mg | ORAL_TABLET | Freq: Once | ORAL | Status: AC
  Administered 2018-10-02: 5 mg via ORAL
  Filled 2018-10-02: qty 1

## 2018-10-02 MED ORDER — KETOROLAC TROMETHAMINE 30 MG/ML IJ SOLN
30.0000 mg | Freq: Once | INTRAMUSCULAR | Status: AC
  Administered 2018-10-02: 30 mg via INTRAMUSCULAR
  Filled 2018-10-02: qty 1

## 2018-10-02 NOTE — ED Provider Notes (Signed)
[facility] Emergency Department Provider Note  ____________________________________________  Time seen: Approximately 11:19 PM  I have reviewed the triage vital signs and the nursing notes.   HISTORY  Chief Complaint Neck Pain    HPI [name] is a 53 y.o. female presents to the emergency department with left upper trapezius pain that started approximately 5 hours ago.  Patient denies falls or mechanisms of trauma.  States that she has had similar pain in the past which has occurred without provocation. No other alleviating measures have been attempted.         Past Medical History:  Diagnosis Date  . Anxiety   . Arthritis    PSORIATIC ARTHRITIS  . Colon polyp   . Depression   . Headache    MIGRAINE  . Ulcer     Patient Active Problem List   Diagnosis Date Noted  . Cocaine use 01/18/2017  . Vitamin D insufficiency 01/18/2017  . Long term prescription benzodiazepine use 11/27/2016  . Chronic low back pain (Primary Area of Pain) (Right) 11/27/2016  . Chronic lower extremity pain (Secondary Area of pain) (Right) 11/27/2016  . Chronic hip pain [facility] Area of Pain) (Right) 11/27/2016  . Chronic knee pain (Fourth Area of Pain) (Right) 11/27/2016  . Chronic foot pain (Fifth Area of Pain) (Right) 11/27/2016  . Chronic shoulder pain (Bilateral) (R>L) 11/27/2016  . Chronic neck pain (Right) 11/27/2016  . Atypical facial pain (Bilateral) (R>L) 11/27/2016  . History of TMJ syndrome 11/27/2016  . 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 Chronic sacroiliac joint pain (Bilateral) (L>R) 11/27/2016  . Lumbar facet hypertrophy (Multilevel) (Bilateral) 11/27/2016  . Long term current use of opiate analgesic 10/23/2016  . Long term prescription opiate use 10/23/2016  . Opiate use 10/23/2016  . Chronic pain syndrome 10/23/2016  . Cystocele 12/20/2012  . Dyssynergia 12/20/2012  . Fecal incontinence 12/20/2012  . Mixed incontinence  12/20/2012  . Rectocele 12/20/2012  . Psoriasis 08/16/2012  . Psoriatic arthritis ([place]) 08/16/2012  . 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 GERD (gastroesophageal reflux disease) 05/24/2012  . Hidradenitis 05/24/2012  . Migraine 05/24/2012    Past Surgical History:  Procedure Laterality Date  . BREAST REDUCTION SURGERY    . COLONOSCOPY WITH ESOPHAGOGASTRODUODENOSCOPY (EGD)    . COLONOSCOPY WITH PROPOFOL N/A 05/19/2016   Procedure: COLONOSCOPY WITH PROPOFOL;  Surgeon: Lollie Sails, MD;  Location: [facility] ENDOSCOPY;  Service: Endoscopy;  Laterality: N/A;  . COLONOSCOPY WITH PROPOFOL N/A 03/06/2018   Procedure: COLONOSCOPY WITH PROPOFOL;  Surgeon: Toledo, Benay Pike, MD;  Location: [facility] ENDOSCOPY;  Service: Gastroenterology;  Laterality: N/A;  . ESOPHAGOGASTRODUODENOSCOPY (EGD) WITH PROPOFOL N/A 03/06/2018   Procedure: ESOPHAGOGASTRODUODENOSCOPY (EGD) WITH PROPOFOL;  Surgeon: Toledo, Benay Pike, MD;  Location: [facility] ENDOSCOPY;  Service: Gastroenterology;  Laterality: N/A;  . foot spurs    . FRACTURE SURGERY    . MANDIBLE FRACTURE SURGERY    . TUBAL LIGATION      Prior to Admission medications   Medication Sig Start Date End Date Taking? Authorizing Provider  ALPRAZolam Duanne Moron) 1 MG tablet Take 1 mg by mouth 4 (four) times daily.    [provider]  amphetamine-dextroamphetamine (ADDERALL) 20 MG tablet Take 20 mg by mouth 2 (two) times daily.     [provider]  buPROPion (WELLBUTRIN XL) 300 MG 24 hr tablet Take 300 mg by mouth daily.    [provider]  clobetasol ointment (TEMOVATE) 0.88 % Apply 1 application 2 (  two) times daily topically.    [provider]  clotrimazole-betamethasone (LOTRISONE) cream APP TO AFFECTED EAR ONCE OR TWICE WEEKLY PRN FOR SKIN IRRITATION. 11/09/16   [provider]  cyclobenzaprine (FLEXERIL) 10 MG tablet Take 10 mg by mouth 3 (three) times daily as needed for muscle spasms.    [provider]  diclofenac (FLECTOR) 1.3 % PTCH Place 1 patch onto the skin as needed.    [provider]  diclofenac sodium (VOLTAREN) 1 % GEL Apply topically as needed.    [provider]  escitalopram (LEXAPRO) 20 MG tablet Take 30 mg by mouth daily.     [provider]  etodolac (LODINE) 500 MG tablet Take 500 mg by mouth 2 (two) times daily.    [provider]  fluocinonide (LIDEX) 0.05 % external solution Apply 1 application topically as needed.    [provider]  fluticasone (FLONASE) 50 MCG/ACT nasal spray Place into both nostrils daily.    [provider]  HYDROcodone-acetaminophen (NORCO/VICODIN) 5-325 MG tablet Take 1 tablet by mouth every 6 (six) hours as needed for moderate pain. Patient not taking: Reported on 03/06/2018 01/20/18   Johnn Hai, PA-C  meloxicam (MOBIC) 15 MG tablet Take 1 tablet (15 mg total) by mouth daily for 7 days. 10/03/18 10/10/18  Lannie Fields, PA-C  methocarbamol (ROBAXIN) 500 MG tablet Take 1 tablet (500 mg total) by mouth every 8 (eight) hours as needed for up to 5 days. 10/03/18 10/08/18  Lannie Fields, PA-C  omeprazole (PRILOSEC) 40 MG capsule Take 20 mg by mouth 2 (two) times daily before a meal.     [provider]  OXYCODONE HCL PO Take 10 mg by mouth 3 (three) times daily.    [provider]    Allergies Topiramate  Family History  Problem Relation Age of Onset  . Liver cancer Mother   . Liver disease Mother   . Cirrhosis Mother   . Alcohol abuse Mother   . Cancer Father   . Colon cancer Maternal Aunt     Social History Social History   Tobacco Use  . Smoking status: Current Some Day Smoker    Types: Cigarettes  . Smokeless tobacco: Never Used  Substance Use Topics  . Alcohol use: Never    Frequency: Never  . Drug use: No     Review of Systems  Constitutional: No fever/chills Eyes: No visual changes. No discharge ENT: No upper respiratory  complaints. Cardiovascular: no chest pain. Respiratory: no cough. No SOB. Gastrointestinal: No abdominal pain.  No nausea, no vomiting.  No diarrhea.  No constipation. Genitourinary: Negative for dysuria. No hematuria Musculoskeletal: Patient has trapezius pain.  Skin: Negative for rash, abrasions, lacerations, ecchymosis. Neurological: Negative for headaches, focal weakness or numbness.   ____________________________________________   PHYSICAL EXAM:  VITAL SIGNS: ED Triage Vitals  Enc Vitals Group     BP 10/02/18 2111 101/79     Pulse Rate 10/02/18 2111 77     Resp 10/02/18 2111 20     Temp 10/02/18 2111 98.3 F (36.8 C)     Temp Source 10/02/18 2111 Oral     SpO2 10/02/18 2111 96 %     Weight 10/02/18 2109 178 lb (80.7 kg)     Height 10/02/18 2109 5\' 3"  (1.6 m)     Head Circumference --      Peak Flow --      Pain Score 10/02/18 2109 10  Pain Loc --      Pain Edu? --      Excl. in [place]? --      Constitutional: Alert and oriented. Well appearing and in no acute distress. Eyes: Conjunctivae are normal. PERRL. EOMI. Head: Atraumatic. ENT:      Ears:       Nose: No congestion/rhinnorhea.      Mouth/Throat: Mucous membranes are moist.  Neck: No stridor.  No cervical spine tenderness to palpation. Cardiovascular: Normal rate, regular rhythm. Normal S1 and S2.  Good peripheral circulation. Respiratory: Normal respiratory effort without tachypnea or retractions. Lungs CTAB. Good air entry to the bases with no decreased or absent breath sounds. Musculoskeletal: Full range of motion to all extremities. No gross deformities appreciated.  Patient has tenderness to palpation along left upper trapezius. Neurologic:  Normal speech and language. No gross focal neurologic deficits are appreciated.  Skin:  Skin is warm, dry and intact. No rash noted. Psychiatric: Mood and affect are normal. Speech and behavior are normal. Patient exhibits appropriate insight and  judgement.   ____________________________________________   LABS (all labs ordered are listed, but only abnormal results are displayed)  Labs Reviewed - No data to display ____________________________________________  EKG   ____________________________________________  RADIOLOGY     No results found.  ____________________________________________    PROCEDURES  Procedure(s) performed:    Procedures    Medications  ketorolac (TORADOL) 30 MG/ML injection 30 mg (30 mg Intramuscular Given 10/02/18 2323)  diazepam (VALIUM) tablet 5 mg (5 mg Oral Given 10/02/18 2323)  oxyCODONE-acetaminophen (PERCOCET/ROXICET) 5-325 MG per tablet 1 tablet (1 tablet Oral Given 10/03/18 0010)     ____________________________________________   INITIAL IMPRESSION / ASSESSMENT AND PLAN / ED COURSE  Pertinent labs & imaging results that were available during my care of the patient were reviewed by me and considered in my medical decision making (see chart for details).  Review of the [place] CSRS was performed in accordance of the [place] prior to dispensing any controlled drugs.           Assessment and plan Muscle spasm 53 year old female presents to the emergency department with tenderness and pain along the upper trapezius on the left side.  Patient reports that pain started approximately 4 to 5 hours ago.  Patient has had muscle spasms of the trapezius muscles in the past and she states that her current symptoms feel exactly the same.  Patient reports that her muscle relaxer usually improves her symptoms.  On physical exam, patient had tenderness with palpation along the upper on the left side.  No midline thoracic spine tenderness.  Patient is tearful on exam.  Patient was given Valium, Toradol and roxicet in the emergency department.  She was discharged with Robaxin and meloxicam.  She was advised to follow-up with primary care as needed. All patient questions were answered.       ____________________________________________  FINAL CLINICAL IMPRESSION(S) / ED DIAGNOSES  Final diagnoses:  Muscle spasm      NEW MEDICATIONS STARTED DURING THIS VISIT:  ED Discharge Orders         Ordered    methocarbamol (ROBAXIN) 500 MG tablet  Every 8 hours PRN     10/03/18 0015    meloxicam (MOBIC) 15 MG tablet  Daily     10/03/18 0015              This chart was dictated using voice recognition software/Dragon. Despite best efforts to proofread, errors can occur which can  change the meaning. Any change was purely unintentional.    Lannie Fields, PA-C 10/03/18 0018    Nena Polio, MD 10/07/18 1302

## 2018-10-02 NOTE — ED Triage Notes (Signed)
Pt reports neck pain since this afternoon.  States it feels like a pinched nerve.  No known injury to neck  Pt alert.

## 2018-10-03 ENCOUNTER — Other Ambulatory Visit: Payer: Self-pay

## 2018-10-03 ENCOUNTER — Encounter: Payer: Self-pay | Admitting: Emergency Medicine

## 2018-10-03 DIAGNOSIS — Z79899 Other long term (current) drug therapy: Secondary | ICD-10-CM | POA: Insufficient documentation

## 2018-10-03 DIAGNOSIS — M542 Cervicalgia: Secondary | ICD-10-CM | POA: Insufficient documentation

## 2018-10-03 DIAGNOSIS — M62838 Other muscle spasm: Secondary | ICD-10-CM | POA: Insufficient documentation

## 2018-10-03 DIAGNOSIS — F17210 Nicotine dependence, cigarettes, uncomplicated: Secondary | ICD-10-CM | POA: Insufficient documentation

## 2018-10-03 MED ORDER — METHOCARBAMOL 500 MG PO TABS: 500.0000 mg | ORAL_TABLET | Freq: Three times a day (TID) | ORAL | 0 refills | Status: AC | PRN

## 2018-10-03 MED ORDER — MELOXICAM 15 MG PO TABS: 15.0000 mg | ORAL_TABLET | Freq: Every day | ORAL | 1 refills | Status: AC

## 2018-10-03 MED ORDER — OXYCODONE-ACETAMINOPHEN 5-325 MG PO TABS
1.0000 | ORAL_TABLET | Freq: Once | ORAL | Status: AC
  Administered 2018-10-03: 1 via ORAL
  Filled 2018-10-03: qty 1

## 2018-10-03 NOTE — ED Triage Notes (Signed)
Pt presents to ED via EMS with c/o continued neck pain and spasms. States she ws seen in this ED last night for the same but isn't feeling any better.

## 2018-10-04 ENCOUNTER — Emergency Department
Admission: EM | Admit: 2018-10-04 | Discharge: 2018-10-04 | Disposition: A | Discharge status: Home / Self Care | Payer: Medicaid Other | Source: Home / Self Care | Attending: Emergency Medicine | Admitting: Emergency Medicine

## 2018-10-04 DIAGNOSIS — M542 Cervicalgia: Secondary | ICD-10-CM

## 2018-10-04 DIAGNOSIS — M62838 Other muscle spasm: Secondary | ICD-10-CM

## 2018-10-04 MED ORDER — DIAZEPAM 5 MG PO TABS
5.0000 mg | ORAL_TABLET | Freq: Once | ORAL | Status: AC
  Administered 2018-10-04: 5 mg via ORAL
  Filled 2018-10-04: qty 1

## 2018-10-04 MED ORDER — OXYCODONE-ACETAMINOPHEN 5-325 MG PO TABS
1.0000 | ORAL_TABLET | Freq: Once | ORAL | Status: AC
  Administered 2018-10-04: 1 via ORAL
  Filled 2018-10-04: qty 1

## 2018-10-04 MED ORDER — DIAZEPAM 5 MG PO TABS: 5.0000 mg | ORAL_TABLET | Freq: Three times a day (TID) | ORAL | 0 refills | Status: DC | PRN

## 2018-10-04 MED ORDER — KETOROLAC TROMETHAMINE 30 MG/ML IJ SOLN
30.0000 mg | Freq: Once | INTRAMUSCULAR | Status: AC
  Administered 2018-10-04: 30 mg via INTRAMUSCULAR
  Filled 2018-10-04: qty 1

## 2018-10-04 MED ORDER — OXYCODONE-ACETAMINOPHEN 5-325 MG PO TABS: 1.0000 | ORAL_TABLET | ORAL | 0 refills | Status: DC | PRN

## 2018-10-04 MED ORDER — IBUPROFEN 800 MG PO TABS: 800.0000 mg | ORAL_TABLET | Freq: Three times a day (TID) | ORAL | 0 refills | Status: DC | PRN

## 2018-10-04 NOTE — ED Notes (Signed)
Patient states she was just seen here in the ED on the 10th and her neck pain is not any better. Patient asking for something for pain. Patient describes pain as 10/10 and is sharp burning pain that radiates into shoulders.

## 2018-10-04 NOTE — ED Notes (Signed)
Patient denies falls or any type of injury.

## 2018-10-04 NOTE — ED Provider Notes (Signed)
[facility] Emergency Department Provider Note   ____________________________________________   First MD Initiated Contact with Patient 10/04/18 0247     (approximate)  I have reviewed the triage vital signs and the nursing notes.   HISTORY  Chief Complaint Neck Pain    HPI [name] is a 53 y.o. female who returns to the ED from home via EMS with persistent left neck pain and spasms.  She was seen in the ED yesterday for same.  Per bruising the chart, looks like patient was given IM Toradol, Percocet and Valium.  She was discharged home on Mobic and Robaxin.  States the medicine she received in the ED worked much better.  Denies fall/injury/trauma.  Complains of pain and muscle spasms to her left neck.  Denies recent fever, cough, chest pain, shortness of breath, abdominal pain, nausea or vomiting.  Denies recent travel or exposure to persons diagnosed with coronavirus.       Past Medical History:  Diagnosis Date  . Anxiety   . Arthritis    PSORIATIC ARTHRITIS  . Colon polyp   . Depression   . Headache    MIGRAINE  . Ulcer     Patient Active Problem List   Diagnosis Date Noted  . Cocaine use 01/18/2017  . Vitamin D insufficiency 01/18/2017  . Long term prescription benzodiazepine use 11/27/2016  . Chronic low back pain (Primary Area of Pain) (Right) 11/27/2016  . Chronic lower extremity pain (Secondary Area of pain) (Right) 11/27/2016  . Chronic hip pain [facility] Area of Pain) (Right) 11/27/2016  . Chronic knee pain (Fourth Area of Pain) (Right) 11/27/2016  . Chronic foot pain (Fifth Area of Pain) (Right) 11/27/2016  . Chronic shoulder pain (Bilateral) (R>L) 11/27/2016  . Chronic neck pain (Right) 11/27/2016  . Atypical facial pain (Bilateral) (R>L) 11/27/2016  . History of TMJ syndrome 11/27/2016  . 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 Chronic sacroiliac joint pain (Bilateral) (L>R) 11/27/2016  . Lumbar facet  hypertrophy (Multilevel) (Bilateral) 11/27/2016  . Long term current use of opiate analgesic 10/23/2016  . Long term prescription opiate use 10/23/2016  . Opiate use 10/23/2016  . Chronic pain syndrome 10/23/2016  . Cystocele 12/20/2012  . Dyssynergia 12/20/2012  . Fecal incontinence 12/20/2012  . Mixed incontinence 12/20/2012  . Rectocele 12/20/2012  . Psoriasis 08/16/2012  . Psoriatic arthritis ([place]) 08/16/2012  . 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 GERD (gastroesophageal reflux disease) 05/24/2012  . Hidradenitis 05/24/2012  . Migraine 05/24/2012    Past Surgical History:  Procedure Laterality Date  . BREAST REDUCTION SURGERY    . COLONOSCOPY WITH ESOPHAGOGASTRODUODENOSCOPY (EGD)    . COLONOSCOPY WITH PROPOFOL N/A 05/19/2016   Procedure: COLONOSCOPY WITH PROPOFOL;  Surgeon: Lollie Sails, MD;  Location: [facility] ENDOSCOPY;  Service: Endoscopy;  Laterality: N/A;  . COLONOSCOPY WITH PROPOFOL N/A 03/06/2018   Procedure: COLONOSCOPY WITH PROPOFOL;  Surgeon: Toledo, Benay Pike, MD;  Location: [facility] ENDOSCOPY;  Service: Gastroenterology;  Laterality: N/A;  . ESOPHAGOGASTRODUODENOSCOPY (EGD) WITH PROPOFOL N/A 03/06/2018   Procedure: ESOPHAGOGASTRODUODENOSCOPY (EGD) WITH PROPOFOL;  Surgeon: Toledo, Benay Pike, MD;  Location: [facility] ENDOSCOPY;  Service: Gastroenterology;  Laterality: N/A;  . foot spurs    . FRACTURE SURGERY    . MANDIBLE FRACTURE SURGERY    . TUBAL LIGATION      Prior to Admission medications   Medication Sig Start Date End Date Taking? Authorizing Provider  ALPRAZolam Duanne Moron) 1 MG tablet Take 1 mg by  mouth 4 (four) times daily.    [provider]  amphetamine-dextroamphetamine (ADDERALL) 20 MG tablet Take 20 mg by mouth 2 (two) times daily.     [provider]  buPROPion (WELLBUTRIN XL) 300 MG 24 hr tablet Take 300 mg by mouth daily.    [provider]  clobetasol ointment (TEMOVATE) 1.88 % Apply 1 application 2 (two) times  daily topically.    [provider]  clotrimazole-betamethasone (LOTRISONE) cream APP TO AFFECTED EAR ONCE OR TWICE WEEKLY PRN FOR SKIN IRRITATION. 11/09/16   [provider]  cyclobenzaprine (FLEXERIL) 10 MG tablet Take 10 mg by mouth 3 (three) times daily as needed for muscle spasms.    [provider]  diazepam (VALIUM) 5 MG tablet Take 1 tablet (5 mg total) by mouth every 8 (eight) hours as needed for anxiety. 10/04/18   Paulette Blanch, MD  diclofenac (FLECTOR) 1.3 % PTCH Place 1 patch onto the skin as needed.    [provider]  diclofenac sodium (VOLTAREN) 1 % GEL Apply topically as needed.    [provider]  escitalopram (LEXAPRO) 20 MG tablet Take 30 mg by mouth daily.     [provider]  etodolac (LODINE) 500 MG tablet Take 500 mg by mouth 2 (two) times daily.    [provider]  fluocinonide (LIDEX) 0.05 % external solution Apply 1 application topically as needed.    [provider]  fluticasone (FLONASE) 50 MCG/ACT nasal spray Place into both nostrils daily.    [provider]  HYDROcodone-acetaminophen (NORCO/VICODIN) 5-325 MG tablet Take 1 tablet by mouth every 6 (six) hours as needed for moderate pain. Patient not taking: Reported on 03/06/2018 01/20/18   Johnn Hai, PA-C  ibuprofen (ADVIL) 800 MG tablet Take 1 tablet (800 mg total) by mouth every 8 (eight) hours as needed for moderate pain. 10/04/18   Paulette Blanch, MD  meloxicam (MOBIC) 15 MG tablet Take 1 tablet (15 mg total) by mouth daily for 7 days. 10/03/18 10/10/18  Lannie Fields, PA-C  methocarbamol (ROBAXIN) 500 MG tablet Take 1 tablet (500 mg total) by mouth every 8 (eight) hours as needed for up to 5 days. 10/03/18 10/08/18  Lannie Fields, PA-C  omeprazole (PRILOSEC) 40 MG capsule Take 20 mg by mouth 2 (two) times daily before a meal.     [provider]  OXYCODONE HCL PO Take 10 mg by mouth 3 (three) times daily.    [provider]  oxyCODONE-acetaminophen (PERCOCET/ROXICET) 5-325 MG tablet Take 1 tablet by mouth every 4 (four) hours as needed for severe pain. 10/04/18   Paulette Blanch, MD    Allergies Topiramate  Family History  Problem Relation Age of Onset  . Liver cancer Mother   . Liver disease Mother   . Cirrhosis Mother   . Alcohol abuse Mother   . Cancer Father   . Colon cancer Maternal Aunt     Social History Social History   Tobacco Use  . Smoking status: Current Some Day Smoker    Types: Cigarettes  . Smokeless tobacco: Never Used  Substance Use Topics  . Alcohol use: Never    Frequency: Never  . Drug use: No    Review of Systems  Constitutional: No fever/chills Eyes: No visual changes. ENT: No sore throat. Cardiovascular: Denies chest pain. Respiratory: Denies shortness of breath. Gastrointestinal: No abdominal pain.  No nausea, no vomiting.  No diarrhea.  No constipation. Genitourinary: Negative  for dysuria. Musculoskeletal: Positive for neck pain.  Negative for back pain. Skin: Negative for rash. Neurological: Negative for headaches, focal weakness or numbness.   ____________________________________________   PHYSICAL EXAM:  VITAL SIGNS: ED Triage Vitals  Enc Vitals Group     BP 10/03/18 2357 122/82     Pulse Rate 10/03/18 2357 76     Resp 10/03/18 2357 20     Temp 10/03/18 2357 98.6 F (37 C)     Temp Source 10/03/18 2357 Oral     SpO2 10/03/18 2357 97 %     Weight 10/03/18 2354 178 lb (80.7 kg)     Height 10/03/18 2354 5\' 4"  (1.626 m)     Head Circumference --      Peak Flow --      Pain Score 10/03/18 2353 10     Pain Loc --      Pain Edu? --      Excl. in [place]? --     Constitutional: Alert and oriented. Well appearing and in mild acute distress. Eyes: Conjunctivae are normal. PERRL. EOMI. Head: Atraumatic. Nose: No congestion/rhinnorhea. Mouth/Throat: Mucous membranes are moist.  Oropharynx non-erythematous. Neck: No stridor.  No cervical  spine tenderness to palpation.  No carotid bruits.  Left trapezius muscle spasms.  Pain on turning head towards the left. Cardiovascular: Normal rate, regular rhythm. Grossly normal heart sounds.  Good peripheral circulation. Respiratory: Normal respiratory effort.  No retractions. Lungs CTAB. Gastrointestinal: Soft and nontender. No distention. No abdominal bruits. No CVA tenderness. Musculoskeletal: No lower extremity tenderness nor edema.  No joint effusions. Neurologic:  Normal speech and language. No gross focal neurologic deficits are appreciated. No gait instability. Skin:  Skin is warm, dry and intact. No rash noted. Psychiatric: Mood and affect are normal. Speech and behavior are normal.  ____________________________________________   LABS (all labs ordered are listed, but only abnormal results are displayed)  Labs Reviewed - No data to display ____________________________________________  EKG  None ____________________________________________  RADIOLOGY  ED MD interpretation: None  Official radiology report(s): No results found.  ____________________________________________   PROCEDURES  Procedure(s) performed (including Critical Care):  Procedures   ____________________________________________   INITIAL IMPRESSION / ASSESSMENT AND PLAN / ED COURSE  As part of my medical decision making, I reviewed the following data within the [place] notes reviewed and incorporated, Old chart reviewed and Notes from prior ED visits     [name] was evaluated in Emergency Department on 10/04/2018 for the symptoms described in the history of present illness. She was evaluated in the context of the global COVID-19 pandemic, which necessitated consideration that the patient might be at risk for infection with the SARS-CoV-2 virus that causes COVID-19. Institutional protocols and algorithms that pertain to the evaluation of patients at risk for  COVID-19 are in a state of rapid change based on information released by regulatory bodies including the CDC and federal and state organizations. These policies and algorithms were followed during the patient's care in the ED.   53 year old female who returns for left trapezius muscle spasms.  No focal neurological deficits noted.  Will administer IM Toradol, Percocet, Valium.  Will discharge with prescriptions for Motrin, Percocet and Valium and strongly encouraged her to follow-up with her PCP next week.  Strict return precautions given.  Patient verbalizes understanding and agrees with plan of care.      ____________________________________________   FINAL CLINICAL IMPRESSION(S) / ED DIAGNOSES  Final diagnoses:  Neck pain  Trapezius muscle spasm     ED Discharge Orders         Ordered    ibuprofen (ADVIL) 800 MG tablet  Every 8 hours PRN     10/04/18 0253    oxyCODONE-acetaminophen (PERCOCET/ROXICET) 5-325 MG tablet  Every 4 hours PRN     10/04/18 0253    diazepam (VALIUM) 5 MG tablet  Every 8 hours PRN     10/04/18 0253           Note:  This document was prepared using Dragon voice recognition software and may include unintentional dictation errors.   Paulette Blanch, MD 10/05/18 2330

## 2018-10-04 NOTE — ED Notes (Signed)
[facility] called with concern that patient is on alprazolam and now has rx for valium.  Per dr Jimmye Norman, cancel the valium rx.

## 2018-10-04 NOTE — ED Notes (Signed)
EDP in with patient at this time.  

## 2018-10-04 NOTE — Discharge Instructions (Addendum)
1.  You may stop taking the medicines you were prescribed yesterday. 2.  You may take medicines as needed for pain and muscle spasms (Motrin/Percocet/Valium #15). 3.  Apply moist heat to affected area several times daily. 4.  Return to the ER for worsening symptoms, persistent vomiting, difficulty breathing or other concerns.

## 2018-10-13 ENCOUNTER — Emergency Department: Payer: Medicaid Other

## 2018-10-13 ENCOUNTER — Inpatient Hospital Stay
Admission: EM | Admit: 2018-10-13 | Discharge: 2018-11-11 | DRG: 853 | Disposition: A | Discharge status: Home / Self Care | Payer: Medicaid Other | Attending: Internal Medicine | Admitting: Internal Medicine

## 2018-10-13 ENCOUNTER — Encounter: Payer: Self-pay | Admitting: Emergency Medicine

## 2018-10-13 ENCOUNTER — Other Ambulatory Visit: Payer: Self-pay

## 2018-10-13 DIAGNOSIS — F419 Anxiety disorder, unspecified: Secondary | ICD-10-CM | POA: Diagnosis present

## 2018-10-13 DIAGNOSIS — Z800 Family history of malignant neoplasm of digestive organs: Secondary | ICD-10-CM | POA: Diagnosis not present

## 2018-10-13 DIAGNOSIS — E669 Obesity, unspecified: Secondary | ICD-10-CM | POA: Diagnosis present

## 2018-10-13 DIAGNOSIS — F1410 Cocaine abuse, uncomplicated: Secondary | ICD-10-CM | POA: Diagnosis not present

## 2018-10-13 DIAGNOSIS — M4654 Other infective spondylopathies, thoracic region: Secondary | ICD-10-CM | POA: Diagnosis not present

## 2018-10-13 DIAGNOSIS — Z87891 Personal history of nicotine dependence: Secondary | ICD-10-CM | POA: Diagnosis not present

## 2018-10-13 DIAGNOSIS — Z79891 Long term (current) use of opiate analgesic: Secondary | ICD-10-CM

## 2018-10-13 DIAGNOSIS — Z86010 Personal history of colonic polyps: Secondary | ICD-10-CM | POA: Diagnosis not present

## 2018-10-13 DIAGNOSIS — M00072 Staphylococcal arthritis, left ankle and foot: Secondary | ICD-10-CM | POA: Diagnosis present

## 2018-10-13 DIAGNOSIS — G0491 Myelitis, unspecified: Secondary | ICD-10-CM | POA: Diagnosis not present

## 2018-10-13 DIAGNOSIS — J189 Pneumonia, unspecified organism: Secondary | ICD-10-CM | POA: Diagnosis present

## 2018-10-13 DIAGNOSIS — G061 Intraspinal abscess and granuloma: Secondary | ICD-10-CM | POA: Diagnosis present

## 2018-10-13 DIAGNOSIS — E871 Hypo-osmolality and hyponatremia: Secondary | ICD-10-CM | POA: Diagnosis present

## 2018-10-13 DIAGNOSIS — F1910 Other psychoactive substance abuse, uncomplicated: Secondary | ICD-10-CM | POA: Diagnosis present

## 2018-10-13 DIAGNOSIS — R1011 Right upper quadrant pain: Secondary | ICD-10-CM | POA: Diagnosis present

## 2018-10-13 DIAGNOSIS — M00062 Staphylococcal arthritis, left knee: Secondary | ICD-10-CM | POA: Diagnosis present

## 2018-10-13 DIAGNOSIS — R748 Abnormal levels of other serum enzymes: Secondary | ICD-10-CM | POA: Diagnosis not present

## 2018-10-13 DIAGNOSIS — R059 Cough, unspecified: Secondary | ICD-10-CM

## 2018-10-13 DIAGNOSIS — Z791 Long term (current) use of non-steroidal anti-inflammatories (NSAID): Secondary | ICD-10-CM | POA: Diagnosis not present

## 2018-10-13 DIAGNOSIS — F329 Major depressive disorder, single episode, unspecified: Secondary | ICD-10-CM | POA: Diagnosis present

## 2018-10-13 DIAGNOSIS — A419 Sepsis, unspecified organism: Secondary | ICD-10-CM | POA: Diagnosis present

## 2018-10-13 DIAGNOSIS — M4620 Osteomyelitis of vertebra, site unspecified: Secondary | ICD-10-CM

## 2018-10-13 DIAGNOSIS — Z6830 Body mass index (BMI) 30.0-30.9, adult: Secondary | ICD-10-CM

## 2018-10-13 DIAGNOSIS — L0291 Cutaneous abscess, unspecified: Secondary | ICD-10-CM

## 2018-10-13 DIAGNOSIS — M6289 Other specified disorders of muscle: Secondary | ICD-10-CM

## 2018-10-13 DIAGNOSIS — R0902 Hypoxemia: Secondary | ICD-10-CM

## 2018-10-13 DIAGNOSIS — M4624 Osteomyelitis of vertebra, thoracic region: Secondary | ICD-10-CM | POA: Diagnosis present

## 2018-10-13 DIAGNOSIS — M4622 Osteomyelitis of vertebra, cervical region: Secondary | ICD-10-CM | POA: Diagnosis present

## 2018-10-13 DIAGNOSIS — E876 Hypokalemia: Secondary | ICD-10-CM | POA: Diagnosis present

## 2018-10-13 DIAGNOSIS — R079 Chest pain, unspecified: Secondary | ICD-10-CM | POA: Diagnosis not present

## 2018-10-13 DIAGNOSIS — F1490 Cocaine use, unspecified, uncomplicated: Secondary | ICD-10-CM | POA: Diagnosis present

## 2018-10-13 DIAGNOSIS — R109 Unspecified abdominal pain: Secondary | ICD-10-CM

## 2018-10-13 DIAGNOSIS — R05 Cough: Secondary | ICD-10-CM

## 2018-10-13 DIAGNOSIS — L4050 Arthropathic psoriasis, unspecified: Secondary | ICD-10-CM | POA: Diagnosis present

## 2018-10-13 DIAGNOSIS — L089 Local infection of the skin and subcutaneous tissue, unspecified: Secondary | ICD-10-CM

## 2018-10-13 DIAGNOSIS — Z79899 Other long term (current) drug therapy: Secondary | ICD-10-CM | POA: Diagnosis not present

## 2018-10-13 DIAGNOSIS — Z5309 Procedure and treatment not carried out because of other contraindication: Secondary | ICD-10-CM | POA: Diagnosis not present

## 2018-10-13 DIAGNOSIS — Z20828 Contact with and (suspected) exposure to other viral communicable diseases: Secondary | ICD-10-CM | POA: Diagnosis present

## 2018-10-13 DIAGNOSIS — R945 Abnormal results of liver function studies: Secondary | ICD-10-CM | POA: Diagnosis not present

## 2018-10-13 DIAGNOSIS — G601 Refsum's disease: Secondary | ICD-10-CM | POA: Diagnosis not present

## 2018-10-13 DIAGNOSIS — Z888 Allergy status to other drugs, medicaments and biological substances status: Secondary | ICD-10-CM

## 2018-10-13 DIAGNOSIS — R7881 Bacteremia: Secondary | ICD-10-CM | POA: Diagnosis not present

## 2018-10-13 DIAGNOSIS — G894 Chronic pain syndrome: Secondary | ICD-10-CM | POA: Diagnosis present

## 2018-10-13 DIAGNOSIS — M542 Cervicalgia: Secondary | ICD-10-CM | POA: Diagnosis not present

## 2018-10-13 DIAGNOSIS — D638 Anemia in other chronic diseases classified elsewhere: Secondary | ICD-10-CM | POA: Diagnosis present

## 2018-10-13 DIAGNOSIS — M0009 Staphylococcal polyarthritis: Secondary | ICD-10-CM | POA: Diagnosis not present

## 2018-10-13 DIAGNOSIS — L409 Psoriasis, unspecified: Secondary | ICD-10-CM | POA: Diagnosis not present

## 2018-10-13 DIAGNOSIS — Z811 Family history of alcohol abuse and dependence: Secondary | ICD-10-CM | POA: Diagnosis not present

## 2018-10-13 DIAGNOSIS — K219 Gastro-esophageal reflux disease without esophagitis: Secondary | ICD-10-CM | POA: Diagnosis present

## 2018-10-13 DIAGNOSIS — Z1159 Encounter for screening for other viral diseases: Secondary | ICD-10-CM | POA: Diagnosis not present

## 2018-10-13 DIAGNOSIS — M549 Dorsalgia, unspecified: Secondary | ICD-10-CM | POA: Diagnosis not present

## 2018-10-13 DIAGNOSIS — M6008 Infective myositis, other site: Secondary | ICD-10-CM

## 2018-10-13 DIAGNOSIS — I743 Embolism and thrombosis of arteries of the lower extremities: Secondary | ICD-10-CM | POA: Diagnosis not present

## 2018-10-13 DIAGNOSIS — M2242 Chondromalacia patellae, left knee: Secondary | ICD-10-CM | POA: Diagnosis present

## 2018-10-13 DIAGNOSIS — J90 Pleural effusion, not elsewhere classified: Secondary | ICD-10-CM | POA: Diagnosis not present

## 2018-10-13 DIAGNOSIS — M65862 Other synovitis and tenosynovitis, left lower leg: Secondary | ICD-10-CM | POA: Diagnosis present

## 2018-10-13 DIAGNOSIS — A4101 Sepsis due to Methicillin susceptible Staphylococcus aureus: Principal | ICD-10-CM | POA: Diagnosis present

## 2018-10-13 DIAGNOSIS — B9561 Methicillin susceptible Staphylococcus aureus infection as the cause of diseases classified elsewhere: Secondary | ICD-10-CM | POA: Diagnosis not present

## 2018-10-13 DIAGNOSIS — M0000 Staphylococcal arthritis, unspecified joint: Secondary | ICD-10-CM | POA: Diagnosis not present

## 2018-10-13 DIAGNOSIS — M4644 Discitis, unspecified, thoracic region: Secondary | ICD-10-CM | POA: Diagnosis present

## 2018-10-13 DIAGNOSIS — I76 Septic arterial embolism: Secondary | ICD-10-CM | POA: Diagnosis not present

## 2018-10-13 DIAGNOSIS — M62838 Other muscle spasm: Secondary | ICD-10-CM | POA: Diagnosis not present

## 2018-10-13 DIAGNOSIS — L02612 Cutaneous abscess of left foot: Secondary | ICD-10-CM | POA: Diagnosis not present

## 2018-10-13 HISTORY — DX: Dorsalgia, unspecified: M54.9

## 2018-10-13 LAB — COMPREHENSIVE METABOLIC PANEL
ALT: 47 U/L — ABNORMAL HIGH (ref 0–44)
AST: 88 U/L — ABNORMAL HIGH (ref 15–41)
Albumin: 2.1 g/dL — ABNORMAL LOW (ref 3.5–5.0)
Alkaline Phosphatase: 759 U/L — ABNORMAL HIGH (ref 38–126)
Anion gap: 13 (ref 5–15)
BUN: 28 mg/dL — ABNORMAL HIGH (ref 6–20)
CO2: 31 mmol/L (ref 22–32)
Calcium: 7.8 mg/dL — ABNORMAL LOW (ref 8.9–10.3)
Chloride: 83 mmol/L — ABNORMAL LOW (ref 98–111)
Creatinine, Ser: 1.01 mg/dL — ABNORMAL HIGH (ref 0.44–1.00)
GFR calc Af Amer: >60 mL/min (ref >60)
GFR calc non Af Amer: >60 mL/min (ref >60)
Glucose, Bld: 159 mg/dL — ABNORMAL HIGH (ref 70–99)
Potassium: 2.8 mmol/L — ABNORMAL LOW (ref 3.5–5.1)
Sodium: 127 mmol/L — ABNORMAL LOW (ref 135–145)
Total Bilirubin: 3.8 mg/dL — ABNORMAL HIGH (ref 0.3–1.2)
Total Protein: 6.1 g/dL — ABNORMAL LOW (ref 6.5–8.1)

## 2018-10-13 LAB — URINALYSIS, COMPLETE (UACMP) WITH MICROSCOPIC
Bacteria, UA: NONE SEEN
Glucose, UA: NEGATIVE mg/dL
Ketones, ur: NEGATIVE mg/dL
Nitrite: NEGATIVE
Protein, ur: 100 mg/dL — AB
RBC / HPF: >50 RBC/hpf — ABNORMAL HIGH (ref 0–5)
Specific Gravity, Urine: 1.014 (ref 1.005–1.030)
Squamous Epithelial / LPF: NONE SEEN (ref 0–5)
pH: 6 (ref 5.0–8.0)

## 2018-10-13 LAB — URINE DRUG SCREEN, QUALITATIVE (ARMC ONLY)
Amphetamines, Ur Screen: NOT DETECTED
Barbiturates, Ur Screen: NOT DETECTED
Benzodiazepine, Ur Scrn: POSITIVE — AB
Cannabinoid 50 Ng, Ur ~~LOC~~: POSITIVE — AB
Cocaine Metabolite,Ur ~~LOC~~: POSITIVE — AB
MDMA (Ecstasy)Ur Screen: NOT DETECTED
Methadone Scn, Ur: NOT DETECTED
Opiate, Ur Screen: NOT DETECTED
Phencyclidine (PCP) Ur S: NOT DETECTED
Tricyclic, Ur Screen: POSITIVE — AB

## 2018-10-13 LAB — SYNOVIAL CELL COUNT + DIFF, W/ CRYSTALS
Crystals, Fluid: NONE SEEN
Eosinophils-Synovial: 0 %
Lymphocytes-Synovial Fld: 4 %
Monocyte-Macrophage-Synovial Fluid: 9 %
Neutrophil, Synovial: 87 %
Other Cells-SYN: 0
WBC, Synovial: 54080 /mm3 — ABNORMAL HIGH (ref 0–200)

## 2018-10-13 LAB — CBC
HCT: 30.2 % — ABNORMAL LOW (ref 36.0–46.0)
Hemoglobin: 10.4 g/dL — ABNORMAL LOW (ref 12.0–15.0)
MCH: 31 pg (ref 26.0–34.0)
MCHC: 34.4 g/dL (ref 30.0–36.0)
MCV: 90.1 fL (ref 80.0–100.0)
Platelets: 423 10*3/uL — ABNORMAL HIGH (ref 150–400)
RBC: 3.35 MIL/uL — ABNORMAL LOW (ref 3.87–5.11)
RDW: 14.1 % (ref 11.5–15.5)
WBC: 22 10*3/uL — ABNORMAL HIGH (ref 4.0–10.5)
nRBC: 0 % (ref 0.0–0.2)

## 2018-10-13 LAB — LACTIC ACID, PLASMA
Lactic Acid, Venous: 1.2 mmol/L (ref 0.5–1.9)
Lactic Acid, Venous: 1.2 mmol/L (ref 0.5–1.9)

## 2018-10-13 LAB — URIC ACID: Uric Acid, Serum: 2.8 mg/dL (ref 2.5–7.1)

## 2018-10-13 LAB — ETHANOL: Alcohol, Ethyl (B): <10 mg/dL (ref <10)

## 2018-10-13 LAB — SARS CORONAVIRUS 2 BY RT PCR (HOSPITAL ORDER, PERFORMED IN ~~LOC~~ HOSPITAL LAB): SARS Coronavirus 2: NEGATIVE

## 2018-10-13 LAB — MAGNESIUM: Magnesium: 2.6 mg/dL — ABNORMAL HIGH (ref 1.7–2.4)

## 2018-10-13 LAB — TROPONIN I: Troponin I: 0.05 ng/mL (ref <0.03)

## 2018-10-13 MED ORDER — POTASSIUM CHLORIDE IN NACL 20-0.9 MEQ/L-% IV SOLN
INTRAVENOUS | Status: DC
  Administered 2018-10-13 – 2018-10-17 (×9): via INTRAVENOUS
  Filled 2018-10-13 (×10): qty 1000

## 2018-10-13 MED ORDER — ONDANSETRON HCL 4 MG/2ML IJ SOLN: 4.0000 mg | Freq: Four times a day (QID) | INTRAMUSCULAR | Status: DC | PRN

## 2018-10-13 MED ORDER — ACETAMINOPHEN 325 MG PO TABS
650.0000 mg | ORAL_TABLET | Freq: Four times a day (QID) | ORAL | Status: DC | PRN
  Administered 2018-10-13 – 2018-11-07 (×6): 650 mg via ORAL
  Filled 2018-10-13 (×7): qty 2

## 2018-10-13 MED ORDER — VANCOMYCIN HCL IN DEXTROSE 1-5 GM/200ML-% IV SOLN
1000.0000 mg | INTRAVENOUS | Status: DC
  Filled 2018-10-13: qty 200

## 2018-10-13 MED ORDER — ONDANSETRON HCL 4 MG PO TABS: 4.0000 mg | ORAL_TABLET | Freq: Four times a day (QID) | ORAL | Status: DC | PRN

## 2018-10-13 MED ORDER — METRONIDAZOLE IN NACL 5-0.79 MG/ML-% IV SOLN
500.0000 mg | Freq: Once | INTRAVENOUS | Status: AC
  Administered 2018-10-13: 500 mg via INTRAVENOUS
  Filled 2018-10-13: qty 100

## 2018-10-13 MED ORDER — FLUTICASONE PROPIONATE 50 MCG/ACT NA SUSP
1.0000 | Freq: Every day | NASAL | Status: DC
  Administered 2018-10-14 – 2018-11-07 (×5): 1 via NASAL
  Filled 2018-10-13 (×3): qty 16

## 2018-10-13 MED ORDER — PANTOPRAZOLE SODIUM 40 MG IV SOLR
40.0000 mg | Freq: Two times a day (BID) | INTRAVENOUS | Status: DC
  Administered 2018-10-13 – 2018-10-14 (×3): 40 mg via INTRAVENOUS
  Filled 2018-10-13 (×3): qty 40

## 2018-10-13 MED ORDER — LIDOCAINE HCL (PF) 1 % IJ SOLN
INTRAMUSCULAR | Status: AC
  Filled 2018-10-13: qty 5

## 2018-10-13 MED ORDER — VANCOMYCIN HCL 1.5 G IV SOLR
1500.0000 mg | Freq: Once | INTRAVENOUS | Status: AC
  Administered 2018-10-13: 1500 mg via INTRAVENOUS
  Filled 2018-10-13: qty 1500

## 2018-10-13 MED ORDER — ALPRAZOLAM 0.25 MG PO TABS
0.2500 mg | ORAL_TABLET | Freq: Three times a day (TID) | ORAL | Status: DC | PRN
  Administered 2018-10-13 – 2018-10-17 (×6): 0.25 mg via ORAL
  Filled 2018-10-13 (×6): qty 1

## 2018-10-13 MED ORDER — SODIUM CHLORIDE 0.9 % IV BOLUS
1000.0000 mL | Freq: Once | INTRAVENOUS | Status: AC
  Administered 2018-10-13: 1000 mL via INTRAVENOUS

## 2018-10-13 MED ORDER — SODIUM CHLORIDE 0.9 % IV SOLN
2.0000 g | Freq: Once | INTRAVENOUS | Status: AC
  Administered 2018-10-13: 2 g via INTRAVENOUS
  Filled 2018-10-13: qty 2

## 2018-10-13 MED ORDER — VANCOMYCIN HCL IN DEXTROSE 1-5 GM/200ML-% IV SOLN: 1000.0000 mg | Freq: Once | INTRAVENOUS | Status: DC

## 2018-10-13 MED ORDER — ACETAMINOPHEN 650 MG RE SUPP: 650.0000 mg | Freq: Four times a day (QID) | RECTAL | Status: DC | PRN

## 2018-10-13 MED ORDER — OXYCODONE HCL 5 MG PO TABS
5.0000 mg | ORAL_TABLET | ORAL | Status: DC | PRN
  Administered 2018-10-14: 5 mg via ORAL
  Filled 2018-10-13: qty 1

## 2018-10-13 MED ORDER — ESCITALOPRAM OXALATE 10 MG PO TABS
30.0000 mg | ORAL_TABLET | Freq: Every day | ORAL | Status: DC
  Administered 2018-10-14 – 2018-11-11 (×28): 30 mg via ORAL
  Filled 2018-10-13 (×29): qty 3

## 2018-10-13 MED ORDER — SODIUM CHLORIDE 0.9 % IV SOLN
2.0000 g | Freq: Three times a day (TID) | INTRAVENOUS | Status: DC
  Administered 2018-10-13 – 2018-10-14 (×2): 2 g via INTRAVENOUS
  Filled 2018-10-13 (×4): qty 2

## 2018-10-13 NOTE — Consult Note (Signed)
CODE SEPSIS - PHARMACY COMMUNICATION  **Broad Spectrum Antibiotics should be administered within 1 hour of Sepsis diagnosis**  Time Code Sepsis Called/Page Received: 1514  Antibiotics Ordered: Cefepime/Vancomycin/Metronidazole  Time of 1st antibiotic administration: 1547  Additional action taken by pharmacy: None  If necessary, Name of Provider/Nurse Contacted: None    [name] ,PharmD Clinical Pharmacist  10/13/2018  3:42 PM

## 2018-10-13 NOTE — ED Provider Notes (Signed)
-----------------------------------------   5:08 PM on 10/13/2018 -----------------------------------------  Was admitted to the hospitalist prior to my arrival however they did asked me to follow-up on the synovial fluid.  There are 54,000 whites however patient has multiple different sources of palpable probable infection in her knee itself is not red or hot to touch.  I did discuss with Dr. Harlow Mares, who he feels at this time he would like to wait for cultures given multiple comorbidities and patient being very poor surgical risk with low oxygen saturation off oxygen and an acute pneumonia etc.  Appreciate consult from Dr. Harlow Mares, the rest of her care obviously we will defer to the hospitalist to whose service she was admitted prior to my arrival.   Schuyler Amor, MD 10/13/18 1709

## 2018-10-13 NOTE — ED Notes (Signed)
Patient requested that this RN discuss her plan of care with her sister.  This RN clarified if patient was willing for me to discuss results with her sister at this time and patient stated yes, "you can tell my sister anything."  This RN call patient's sister Katharine Look and updated her on plan of care.  Will continue to monitor.

## 2018-10-13 NOTE — H&P (Signed)
[name] at [facility] NAME: [name]    MR#:  [id number]  DATE OF BIRTH:  10/09/1965  DATE OF ADMISSION:  10/13/2018  PRIMARY CARE PHYSICIAN: Marguerita Merles, MD   REQUESTING/REFERRING PHYSICIAN: Dr Harvest Dark  CHIEF COMPLAINT:   Chief Complaint  Patient presents with   Altered Mental Status    HISTORY OF PRESENT ILLNESS:  [name]  is a 53 y.o. female coming in with pain all over.  Patient was altered for ER physician but starting to talk a little bit more.  She states she has pain all over.  Cannot elaborate much.  She told the stress sonographer that she has not eaten in days.  She lives with her boyfriend but unable to contact.  The ER physician did a joint aspiration on the left knee which results are still pending.  The left knee was swollen but not red.  Her left toe is swollen and red.  Patient denies marijuana and crack cocaine use even though that was found in the urine toxicology.  Hospitalist services were contacted for sepsis.  PAST MEDICAL HISTORY:   Past Medical History:  Diagnosis Date   Anxiety    Arthritis    PSORIATIC ARTHRITIS   Back pain    Colon polyp    Depression    Headache    MIGRAINE   Ulcer     PAST SURGICAL HISTORY:   Past Surgical History:  Procedure Laterality Date   BREAST REDUCTION SURGERY     COLONOSCOPY WITH ESOPHAGOGASTRODUODENOSCOPY (EGD)     COLONOSCOPY WITH PROPOFOL N/A 05/19/2016   Procedure: COLONOSCOPY WITH PROPOFOL;  Surgeon: Lollie Sails, MD;  Location: [facility] ENDOSCOPY;  Service: Endoscopy;  Laterality: N/A;   COLONOSCOPY WITH PROPOFOL N/A 03/06/2018   Procedure: COLONOSCOPY WITH PROPOFOL;  Surgeon: Toledo, Benay Pike, MD;  Location: [facility] ENDOSCOPY;  Service: Gastroenterology;  Laterality: N/A;   ESOPHAGOGASTRODUODENOSCOPY (EGD) WITH PROPOFOL N/A 03/06/2018   Procedure: ESOPHAGOGASTRODUODENOSCOPY (EGD) WITH PROPOFOL;  Surgeon: Toledo, Benay Pike, MD;   Location: [facility] ENDOSCOPY;  Service: Gastroenterology;  Laterality: N/A;   foot spurs     FRACTURE SURGERY     MANDIBLE FRACTURE SURGERY     TUBAL LIGATION      SOCIAL HISTORY:   Social History   Tobacco Use   Smoking status: Former Smoker    Types: Cigarettes   Smokeless tobacco: Never Used  Substance Use Topics   Alcohol use: Never    Frequency: Never    FAMILY HISTORY:   Family History  Problem Relation Age of Onset   Liver cancer Mother    Liver disease Mother    Cirrhosis Mother    Alcohol abuse Mother    Cancer Father    Colon cancer Maternal Aunt     DRUG ALLERGIES:   Allergies  Allergen Reactions   Topiramate Hives    REVIEW OF SYSTEMS:  Limited with altered mental status  CONSTITUTIONAL: Some.  EARS, NOSE, AND THROAT: No sore throat RESPIRATORY: No cough, shortness of breath. CARDIOVASCULAR: No chest pain.  GASTROINTESTINAL: No nausea, vomiting, diarrhea or abdominal pain.  SKIN: Some total redness and pain MUSCULOSKELETAL: Some toe pain and joint pain PSYCHIATRY: History of anxiety depression  MEDICATIONS AT HOME:   Prior to Admission medications   Medication Sig Start Date End Date Taking? Authorizing Provider  ALPRAZolam Duanne Moron) 1 MG tablet Take 1 mg by mouth 4 (four) times daily.    [provider]  amphetamine-dextroamphetamine (ADDERALL) 20 MG tablet Take 20 mg by mouth 2 (two) times daily.     [provider]  buPROPion (WELLBUTRIN XL) 300 MG 24 hr tablet Take 300 mg by mouth daily.    [provider]  clobetasol ointment (TEMOVATE) 5.46 % Apply 1 application 2 (two) times daily topically.    [provider]  clotrimazole-betamethasone (LOTRISONE) cream APP TO AFFECTED EAR ONCE OR TWICE WEEKLY PRN FOR SKIN IRRITATION. 11/09/16   [provider]  cyclobenzaprine (FLEXERIL) 10 MG tablet Take 10 mg by mouth 3 (three) times daily as needed for muscle spasms.    [provider]    diazepam (VALIUM) 5 MG tablet Take 1 tablet (5 mg total) by mouth every 8 (eight) hours as needed for anxiety. 10/04/18   Paulette Blanch, MD  diclofenac (FLECTOR) 1.3 % PTCH Place 1 patch onto the skin as needed.    [provider]  diclofenac sodium (VOLTAREN) 1 % GEL Apply topically as needed.    [provider]  escitalopram (LEXAPRO) 20 MG tablet Take 30 mg by mouth daily.     [provider]  etodolac (LODINE) 500 MG tablet Take 500 mg by mouth 2 (two) times daily.    [provider]  fluocinonide (LIDEX) 0.05 % external solution Apply 1 application topically as needed.    [provider]  fluticasone (FLONASE) 50 MCG/ACT nasal spray Place into both nostrils daily.    [provider]  HYDROcodone-acetaminophen (NORCO/VICODIN) 5-325 MG tablet Take 1 tablet by mouth every 6 (six) hours as needed for moderate pain. Patient not taking: Reported on 03/06/2018 01/20/18   Johnn Hai, PA-C  ibuprofen (ADVIL) 800 MG tablet Take 1 tablet (800 mg total) by mouth every 8 (eight) hours as needed for moderate pain. 10/04/18   Paulette Blanch, MD  omeprazole (PRILOSEC) 40 MG capsule Take 20 mg by mouth 2 (two) times daily before a meal.     [provider]  OXYCODONE HCL PO Take 10 mg by mouth 3 (three) times daily.    [provider]  oxyCODONE-acetaminophen (PERCOCET/ROXICET) 5-325 MG tablet Take 1 tablet by mouth every 4 (four) hours as needed for severe pain. 10/04/18   Paulette Blanch, MD      VITAL SIGNS:  Blood pressure 111/63, pulse 92, temperature 97.9 F (36.6 C), temperature source Oral, resp. rate 14, height 5\' 4"  (1.626 m), weight 80.7 kg, last menstrual period 01/22/2017, SpO2 99 %.  PHYSICAL EXAMINATION:  GENERAL:  53 y.o.-year-old patient lying in the bed with no acute distress.  EYES: Pupils equal, round, reactive to light and accommodation. No scleral icterus. Extraocular muscles intact.  HEENT: Head atraumatic,  normocephalic. Oropharynx and nasopharynx clear.  NECK:  Supple, no jugular venous distention. No thyroid enlargement, no tenderness.  LUNGS: Normal breath sounds bilaterally, no wheezing, rales,rhonchi or crepitation. No use of accessory muscles of respiration.  CARDIOVASCULAR: S1, S2 normal. No murmurs, rubs, or gallops.  ABDOMEN: Soft, nontender, distended. Bowel sounds present. No organomegaly or mass.  EXTREMITIES: No pedal edema, cyanosis, or clubbing.  Left knee less swollen after joint effusion tapped. NEUROLOGIC: Cranial nerves II through XII are intact. Muscle strength 5/5 in all extremities. Sensation intact. Gait not checked.  PSYCHIATRIC: The patient is alert and oriented x 3.  SKIN: No rash, lesion, or ulcer.      LABORATORY PANEL:   CBC Recent Labs  Lab 10/13/18 1419  WBC 22.0*  HGB 10.4*  HCT 30.2*  PLT 423*   ------------------------------------------------------------------------------------------------------------------  Chemistries  Recent Labs  Lab 10/13/18 1419  NA 127*  K 2.8*  CL 83*  CO2 31  GLUCOSE 159*  BUN 28*  CREATININE 1.01*  CALCIUM 7.8*  AST 88*  ALT 47*  ALKPHOS 759*  BILITOT 3.8*   ------------------------------------------------------------------------------------------------------------------  Cardiac Enzymes Recent Labs  Lab 10/13/18 1419  TROPONINI 0.05*   ------------------------------------------------------------------------------------------------------------------  RADIOLOGY:  Dg Chest Portable 1 View  Result Date: 10/13/2018 CLINICAL DATA:  Lethargy and slurred speech. Recent crack cocaine use. EXAM: PORTABLE CHEST 1 VIEW COMPARISON:  None. FINDINGS: Lungs are hypoinflated demonstrate mild opacification over the left base which may be due to small effusion with atelectasis versus infection. Mild prominence of the right perihilar markings. Cardiomediastinal silhouette is within normal. Mild degenerative change of the  spine. IMPRESSION: Left base opacification which may be due to small effusion with atelectasis versus infection. Electronically Signed   By: Marin Olp M.D.   On: 10/13/2018 14:53   Dg Knee Complete 4 Views Left  Result Date: 10/13/2018 CLINICAL DATA:  Knee EXAM: LEFT KNEE - COMPLETE 4+ VIEW COMPARISON:  None. FINDINGS: No fracture or malalignment. Minimal degenerative of the medial joint space. Trace knee effusion. IMPRESSION: No acute osseous abnormality.  Trace knee effusion Electronically Signed   By: Donavan Foil M.D.   On: 10/13/2018 14:58   Dg Foot Complete Left  Result Date: 10/13/2018 CLINICAL DATA:  Foot pain EXAM: LEFT FOOT - COMPLETE 3+ VIEW COMPARISON:  11/28/2017 FINDINGS: No fracture or malalignment. Mild degenerative change at the first MTP joint. Small plantar calcaneal spur. IMPRESSION: No acute osseous abnormality Electronically Signed   By: Donavan Foil M.D.   On: 10/13/2018 14:59   US Abdomen Limited Ruq  Result Date: 10/13/2018 CLINICAL DATA:  Elevated liver function test.  Sepsis. EXAM: ULTRASOUND ABDOMEN LIMITED RIGHT UPPER QUADRANT COMPARISON:  None. FINDINGS: Gallbladder: Contracted gallbladder with borderline wall thickening of 3.4 mm. No gallstones or sludge seen. No sonographic Murphy sign noted by sonographer. Common bile duct: Diameter: 4.6 mm Liver: No focal lesion identified. Within normal limits in parenchymal echogenicity. Portal vein is patent on color Doppler imaging with normal direction of blood flow towards the liver. IMPRESSION: Contracted gallbladder with borderline wall thickening, which may be artifactual. Negative sonographic Murphy's sign. Normal appearance of the liver. Electronically Signed   By: Fidela Salisbury M.D.   On: 10/13/2018 16:29    EKG:   Sinus rhythm 90 bpm  IMPRESSION AND PLAN:   1.  Clinical sepsis with leukocytosis tachycardia and fever.  COVID-19 testing negative.  Urinalysis positive.  Patient also has a left third toe  that is red and inflamed.  Left knee was tapped but still waiting for cell count and Gram stain.  Aggressive antibiotics with vancomycin and cefepime.  Will get podiatry consultation.  Potentially may need orthopedic consultation.  Follow-up blood cultures and urine culture. 2.  Elevated liver function tests.  Could be secondary to drugs.  Ultrasound of the right upper quadrant pending.  Follow-up liver enzymes.  May need further testing depending on results. 3.  Hyponatremia and hypokalemia.  IV fluids with potassium in normal saline. 4.  Drug abuse.  Will put on low-dose Xanax for now to prevent withdrawal.  Low-dose oxycodone for pain. 5.  Anxiety depression continue usual medications.  Consider psychiatric consultation tomorrow. 6.  History of ulcers.  Will give Protonix IV.   All the records are reviewed and case discussed with  ED provider. Management plans discussed with the patient, family patient's aunt on the phone and they are in agreement.  CODE STATUS: full code  TOTAL TIME TAKING CARE OF THIS PATIENT: 50 minutes.    Loletha Grayer M.D on 10/13/2018 at 4:40 PM  Between 7am to 6pm - Pager - [phone]  After 6pm call admission pager [phone]  [facility]  [phone]  CC: Primary care physician; Marguerita Merles, MD

## 2018-10-13 NOTE — Consult Note (Signed)
Pharmacy Antibiotic Note  [name] is a 53 y.o. female admitted on 10/13/2018 with sepsis.  Pharmacy has been consulted for Vancomycin/Cefepime dosing.  Plan: Cefepime 2g q8 hours  Pt received Vancomycin 1500mg  x 1 in the ED, will follow with  Vancomycin 1000 mg IV Q 24 hrs. Goal AUC 400-550. Expected AUC: 480 SCr used: 1.01   Height: 5\' 4"  (162.6 cm) Weight: 178 lb (80.7 kg) IBW/kg (Calculated) : 54.7  Temp (24hrs), Avg:97.9 F (36.6 C), Min:97.9 F (36.6 C), Max:97.9 F (36.6 C)  Recent Labs  Lab 10/13/18 1419  WBC 22.0*  CREATININE 1.01*    Estimated Creatinine Clearance: 67.7 mL/min (A) (by C-G formula based on SCr of 1.01 mg/dL (H)).    Allergies  Allergen Reactions  . Topiramate Hives    Antimicrobials this admission: Cefepime 6/21 >> Vancomycin 6/21 >> Metronidazole x 1  Dose adjustments this admission: None   Microbiology results: 6/21 BCx: pending 6/21 UCx: pending 6/21 Synovial Fluid Left Knee Gram Stain/Culture pending   COVID NEG  Thank you for allowing pharmacy to be a part of this patient's care.  Lu Duffel, PharmD, BCPS Clinical Pharmacist 10/13/2018 4:44 PM

## 2018-10-13 NOTE — ED Triage Notes (Signed)
Patient presents to the ED via EMS from home.  EMS was called out for back pain that patient states is chronic but patient was also complaining of left knee pain and pain to middle toe on left foot.  Toe appears purple/blue in color and is swollen to approx. Twice normal size.  Patient's knee also appears significantly swollen.  Per EMS patient's boyfriend told them that patient has used crack cocaine within the past couple of days.  Patient presents with lethargy and slurred speech.  Patient is poor historian.
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
## 2018-10-13 NOTE — ED Provider Notes (Signed)
[facility] Emergency Department Provider Note  Time seen: 2:13 PM  I have reviewed the triage vital signs and the nursing notes.   HISTORY  Chief Complaint Altered Mental Status    HPI [name] is a 53 y.o. female with a past medical history of anxiety, depression, substance abuse, chronic pain, presents to the emergency department  with complaints of leg pain.  According to EMS they picked the patient up from her residence, states she was slurring her speech complaining of leg pain requesting transport to the hospital.  Per EMS the other person living in the house states the patient had been using crack cocaine recently, patient states it is been weeks since she used crack cocaine.  Denies any alcohol or any other drug use.  Here the patient states over the past several weeks she has been experiencing pain in her left knee and left foot.  Denies any falls or trauma.  Patient is somewhat somnolent with slurred speech upon arrival.  Patient complains of left knee and left foot pain.  Past Medical History:  Diagnosis Date  . Anxiety   . Arthritis    PSORIATIC ARTHRITIS  . Colon polyp   . Depression   . Headache    MIGRAINE  . Ulcer     Patient Active Problem List   Diagnosis Date Noted  . Cocaine use 01/18/2017  . Vitamin D insufficiency 01/18/2017  . Long term prescription benzodiazepine use 11/27/2016  . Chronic low back pain (Primary Area of Pain) (Right) 11/27/2016  . Chronic lower extremity pain (Secondary Area of pain) (Right) 11/27/2016  . Chronic hip pain [facility] Area of Pain) (Right) 11/27/2016  . Chronic knee pain (Fourth Area of Pain) (Right) 11/27/2016  . Chronic foot pain (Fifth Area of Pain) (Right) 11/27/2016  . Chronic shoulder pain (Bilateral) (R>L) 11/27/2016  . Chronic neck pain (Right) 11/27/2016  . Atypical facial pain (Bilateral) (R>L) 11/27/2016  . History of TMJ syndrome 11/27/2016  . Lumbar facet joint syndrome (Bilateral)  (R>L) 11/27/2016  . Chronic sacroiliac joint pain (Bilateral) (L>R) 11/27/2016  . Lumbar facet hypertrophy (Multilevel) (Bilateral) 11/27/2016  . Long term current use of opiate analgesic 10/23/2016  . Long term prescription opiate use 10/23/2016  . Opiate use 10/23/2016  . Chronic pain syndrome 10/23/2016  . Cystocele 12/20/2012  . Dyssynergia 12/20/2012  . Fecal incontinence 12/20/2012  . Mixed incontinence 12/20/2012  . Rectocele 12/20/2012  . Psoriasis 08/16/2012  . Psoriatic arthritis ([place]) 08/16/2012  . 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 GERD (gastroesophageal reflux disease) 05/24/2012  . Hidradenitis 05/24/2012  . Migraine 05/24/2012    Past Surgical History:  Procedure Laterality Date  . BREAST REDUCTION SURGERY    . COLONOSCOPY WITH ESOPHAGOGASTRODUODENOSCOPY (EGD)    . COLONOSCOPY WITH PROPOFOL N/A 05/19/2016   Procedure: COLONOSCOPY WITH PROPOFOL;  Surgeon: Lollie Sails, MD;  Location: [facility] ENDOSCOPY;  Service: Endoscopy;  Laterality: N/A;  . COLONOSCOPY WITH PROPOFOL N/A 03/06/2018   Procedure: COLONOSCOPY WITH PROPOFOL;  Surgeon: Toledo, Benay Pike, MD;  Location: [facility] ENDOSCOPY;  Service: Gastroenterology;  Laterality: N/A;  . ESOPHAGOGASTRODUODENOSCOPY (EGD) WITH PROPOFOL N/A 03/06/2018   Procedure: ESOPHAGOGASTRODUODENOSCOPY (EGD) WITH PROPOFOL;  Surgeon: Toledo, Benay Pike, MD;  Location: [facility] ENDOSCOPY;  Service: Gastroenterology;  Laterality: N/A;  . foot spurs    . FRACTURE SURGERY    . MANDIBLE FRACTURE SURGERY    . TUBAL LIGATION      Prior to Admission medications   Medication Sig  Start Date End Date Taking? Authorizing Provider  ALPRAZolam Duanne Moron) 1 MG tablet Take 1 mg by mouth 4 (four) times daily.    [provider]  amphetamine-dextroamphetamine (ADDERALL) 20 MG tablet Take 20 mg by mouth 2 (two) times daily.     [provider]  buPROPion (WELLBUTRIN XL) 300 MG 24 hr tablet Take 300 mg by mouth daily.     [provider]  clobetasol ointment (TEMOVATE) 7.56 % Apply 1 application 2 (two) times daily topically.    [provider]  clotrimazole-betamethasone (LOTRISONE) cream APP TO AFFECTED EAR ONCE OR TWICE WEEKLY PRN FOR SKIN IRRITATION. 11/09/16   [provider]  cyclobenzaprine (FLEXERIL) 10 MG tablet Take 10 mg by mouth 3 (three) times daily as needed for muscle spasms.    [provider]  diazepam (VALIUM) 5 MG tablet Take 1 tablet (5 mg total) by mouth every 8 (eight) hours as needed for anxiety. 10/04/18   Paulette Blanch, MD  diclofenac (FLECTOR) 1.3 % PTCH Place 1 patch onto the skin as needed.    [provider]  diclofenac sodium (VOLTAREN) 1 % GEL Apply topically as needed.    [provider]  escitalopram (LEXAPRO) 20 MG tablet Take 30 mg by mouth daily.     [provider]  etodolac (LODINE) 500 MG tablet Take 500 mg by mouth 2 (two) times daily.    [provider]  fluocinonide (LIDEX) 0.05 % external solution Apply 1 application topically as needed.    [provider]  fluticasone (FLONASE) 50 MCG/ACT nasal spray Place into both nostrils daily.    [provider]  HYDROcodone-acetaminophen (NORCO/VICODIN) 5-325 MG tablet Take 1 tablet by mouth every 6 (six) hours as needed for moderate pain. Patient not taking: Reported on 03/06/2018 01/20/18   Johnn Hai, PA-C  ibuprofen (ADVIL) 800 MG tablet Take 1 tablet (800 mg total) by mouth every 8 (eight) hours as needed for moderate pain. 10/04/18   Paulette Blanch, MD  omeprazole (PRILOSEC) 40 MG capsule Take 20 mg by mouth 2 (two) times daily before a meal.     [provider]  OXYCODONE HCL PO Take 10 mg by mouth 3 (three) times daily.    [provider]  oxyCODONE-acetaminophen (PERCOCET/ROXICET) 5-325 MG tablet Take 1 tablet by mouth every 4 (four) hours as needed for severe pain. 10/04/18   Paulette Blanch, MD    Allergies  Allergen  Reactions  . Topiramate Hives    Family History  Problem Relation Age of Onset  . Liver cancer Mother   . Liver disease Mother   . Cirrhosis Mother   . Alcohol abuse Mother   . Cancer Father   . Colon cancer Maternal Aunt     Social History Social History   Tobacco Use  . Smoking status: Current Some Day Smoker    Types: Cigarettes  . Smokeless tobacco: Never Used  Substance Use Topics  . Alcohol use: Never    Frequency: Never  . Drug use: No    Review of Systems Constitutional: Negative for fever. Cardiovascular: Negative for chest pain. Respiratory: Negative for shortness of breath.  Patient coughing in the emergency department. Gastrointestinal: Negative for abdominal pain, vomiting Musculoskeletal: Left knee and left foot pain Skin: Negative for skin complaints  Neurological: Negative for headache All other ROS negative  ____________________________________________   PHYSICAL EXAM:  Constitutional: Patient is awake and alert, she is oriented x3 however her responses are  slowed and her speech is mildly slurred. Eyes: Normal exam ENT      Head: Normocephalic and atraumatic.      Mouth/Throat: Mucous membranes are moist. Cardiovascular: Normal rate, regular rhythm Respiratory: Normal respiratory effort without tachypnea nor retractions. Breath sounds are clear  Gastrointestinal: Soft and nontender. No distention. Musculoskeletal: Good range of motion right lower extremity, pain with range of motion left lower extremity around the left knee.  Hip is nontender.  Patient appears to have a moderate joint effusion of the left knee.  Good range of motion of the foot however she does have a blister to the dorsal aspect of the left second toe. Neurologic:  Normal speech and language. No gross focal neurologic deficits  Skin:  Skin is warm.  Blister to the left second toe. Psychiatric: Mood and affect are normal.  ____________________________________________     EKG  EKG viewed and interpreted by myself shows normal sinus rhythm at 90 bpm with a narrow QRS, normal axis, normal intervals, nonspecific ST changes.  ____________________________________________   INITIAL IMPRESSION / ASSESSMENT AND PLAN / ED COURSE  Pertinent labs & imaging results that were available during my care of the patient were reviewed by me and considered in my medical decision making (see chart for details).   Patient presents emergency department for leg pain.  Patient came by EMS for her knee and foot pain but denies any trauma.  States the pain is been ongoing for 2 to 3 weeks.  Patient does appear to have a moderate joint effusion in the left knee, has a small blister to the dorsal aspect of the left second toe, no other acute abnormalities noted.  We will obtain x-ray images of the left knee and left foot as precaution.  We will check labs and continue to closely monitor.  Patient does have a frequent cough in the emergency department.  We will check a corona swab as a precaution.  Given the large joint effusion I performed a left knee aspiration and remove 60 cc of yellow cloudy synovial fluid.  Patient's labs have begun to result, white blood cell count of 22,000, patient is tachypneic and hypoxic 89% on room air.  Chest x-ray pending, COVID test pending.  Remainder the lab work pending, synovial labs pending.    Apiration of blood/fluid Performed by: Harvest Dark Consent obtained. Required items: required blood products, implants, devices, and special equipment available Patient identity confirmed: verbally with patient Time out: Immediately prior to procedure a "time out" was called to verify the correct patient, procedure, equipment, support staff and site/side marked as required. Preparation: Patient was prepped and draped in the usual sterile fashion. Patient tolerance: Patient tolerated the procedure well with no immediate complications.  Location of  aspiration: Left knee     Jermiyah Ricotta was evaluated in Emergency Department on 10/13/2018 for the symptoms described in the history of present illness. She was evaluated in the context of the global COVID-19 pandemic, which necessitated consideration that the patient might be at risk for infection with the SARS-CoV-2 virus that causes COVID-19. Institutional protocols and algorithms that pertain to the evaluation of patients at risk for COVID-19 are in a state of rapid change based on information released by regulatory bodies including the CDC and federal and state organizations. These policies and algorithms were followed during the patient's care in the ED.  ____________________________________________   FINAL CLINICAL IMPRESSION(S) / ED DIAGNOSES  Left leg pain   Harvest Dark, MD 10/16/18 1107

## 2018-10-14 ENCOUNTER — Inpatient Hospital Stay (HOSPITAL_COMMUNITY)
Admit: 2018-10-14 | Discharge: 2018-10-14 | Disposition: A | Discharge status: Home / Self Care | Payer: Medicaid Other | Attending: Infectious Diseases | Admitting: Infectious Diseases

## 2018-10-14 DIAGNOSIS — M00062 Staphylococcal arthritis, left knee: Secondary | ICD-10-CM

## 2018-10-14 DIAGNOSIS — Z888 Allergy status to other drugs, medicaments and biological substances status: Secondary | ICD-10-CM

## 2018-10-14 DIAGNOSIS — I743 Embolism and thrombosis of arteries of the lower extremities: Secondary | ICD-10-CM

## 2018-10-14 DIAGNOSIS — L409 Psoriasis, unspecified: Secondary | ICD-10-CM

## 2018-10-14 DIAGNOSIS — B9561 Methicillin susceptible Staphylococcus aureus infection as the cause of diseases classified elsewhere: Secondary | ICD-10-CM

## 2018-10-14 DIAGNOSIS — R7881 Bacteremia: Secondary | ICD-10-CM

## 2018-10-14 DIAGNOSIS — M542 Cervicalgia: Secondary | ICD-10-CM

## 2018-10-14 DIAGNOSIS — M549 Dorsalgia, unspecified: Secondary | ICD-10-CM

## 2018-10-14 DIAGNOSIS — L4050 Arthropathic psoriasis, unspecified: Secondary | ICD-10-CM

## 2018-10-14 DIAGNOSIS — I76 Septic arterial embolism: Secondary | ICD-10-CM

## 2018-10-14 DIAGNOSIS — Z87891 Personal history of nicotine dependence: Secondary | ICD-10-CM

## 2018-10-14 DIAGNOSIS — M00072 Staphylococcal arthritis, left ankle and foot: Secondary | ICD-10-CM

## 2018-10-14 DIAGNOSIS — R945 Abnormal results of liver function studies: Secondary | ICD-10-CM

## 2018-10-14 LAB — BASIC METABOLIC PANEL
Anion gap: 12 (ref 5–15)
BUN: 25 mg/dL — ABNORMAL HIGH (ref 6–20)
CO2: 29 mmol/L (ref 22–32)
Calcium: 7.3 mg/dL — ABNORMAL LOW (ref 8.9–10.3)
Chloride: 93 mmol/L — ABNORMAL LOW (ref 98–111)
Creatinine, Ser: 0.83 mg/dL (ref 0.44–1.00)
GFR calc Af Amer: >60 mL/min (ref >60)
GFR calc non Af Amer: >60 mL/min (ref >60)
Glucose, Bld: 128 mg/dL — ABNORMAL HIGH (ref 70–99)
Potassium: 2.9 mmol/L — ABNORMAL LOW (ref 3.5–5.1)
Sodium: 134 mmol/L — ABNORMAL LOW (ref 135–145)

## 2018-10-14 LAB — BLOOD CULTURE ID PANEL (REFLEXED)

## 2018-10-14 LAB — CBC
HCT: 29.9 % — ABNORMAL LOW (ref 36.0–46.0)
Hemoglobin: 9.9 g/dL — ABNORMAL LOW (ref 12.0–15.0)
MCH: 30.7 pg (ref 26.0–34.0)
MCHC: 33.1 g/dL (ref 30.0–36.0)
MCV: 92.6 fL (ref 80.0–100.0)
Platelets: 391 10*3/uL (ref 150–400)
RBC: 3.23 MIL/uL — ABNORMAL LOW (ref 3.87–5.11)
RDW: 14.6 % (ref 11.5–15.5)
WBC: 18.9 10*3/uL — ABNORMAL HIGH (ref 4.0–10.5)
nRBC: 0 % (ref 0.0–0.2)

## 2018-10-14 LAB — ECHOCARDIOGRAM COMPLETE
Height: 64 in
Weight: 2848 oz

## 2018-10-14 LAB — HEPATIC FUNCTION PANEL
ALT: 42 U/L (ref 0–44)
AST: 59 U/L — ABNORMAL HIGH (ref 15–41)
Albumin: 1.9 g/dL — ABNORMAL LOW (ref 3.5–5.0)
Alkaline Phosphatase: 584 U/L — ABNORMAL HIGH (ref 38–126)
Bilirubin, Direct: 2.1 mg/dL — ABNORMAL HIGH (ref 0.0–0.2)
Indirect Bilirubin: 0.8 mg/dL (ref 0.3–0.9)
Total Bilirubin: 2.9 mg/dL — ABNORMAL HIGH (ref 0.3–1.2)
Total Protein: 5.4 g/dL — ABNORMAL LOW (ref 6.5–8.1)

## 2018-10-14 LAB — SEDIMENTATION RATE: Sed Rate: 56 mm/hr — ABNORMAL HIGH (ref 0–30)

## 2018-10-14 MED ORDER — CEFAZOLIN SODIUM-DEXTROSE 2-4 GM/100ML-% IV SOLN
2.0000 g | Freq: Three times a day (TID) | INTRAVENOUS | Status: DC
  Administered 2018-10-14: 11:00:00 2 g via INTRAVENOUS
  Filled 2018-10-14 (×5): qty 100

## 2018-10-14 MED ORDER — OXYCODONE HCL 5 MG PO TABS
5.0000 mg | ORAL_TABLET | ORAL | Status: DC | PRN
  Administered 2018-10-15 – 2018-10-28 (×38): 5 mg via ORAL
  Filled 2018-10-14 (×40): qty 1

## 2018-10-14 MED ORDER — CEFAZOLIN SODIUM-DEXTROSE 2-4 GM/100ML-% IV SOLN
2.0000 g | Freq: Three times a day (TID) | INTRAVENOUS | Status: DC
  Administered 2018-10-14 – 2018-10-16 (×5): 2 g via INTRAVENOUS
  Filled 2018-10-14 (×7): qty 100

## 2018-10-14 MED ORDER — KETOROLAC TROMETHAMINE 15 MG/ML IJ SOLN
15.0000 mg | Freq: Three times a day (TID) | INTRAMUSCULAR | Status: AC
  Administered 2018-10-14 – 2018-10-19 (×14): 15 mg via INTRAVENOUS
  Filled 2018-10-14 (×15): qty 1

## 2018-10-14 MED ORDER — POTASSIUM CHLORIDE CRYS ER 20 MEQ PO TBCR
40.0000 meq | EXTENDED_RELEASE_TABLET | Freq: Once | ORAL | Status: AC
  Administered 2018-10-14: 40 meq via ORAL
  Filled 2018-10-14: qty 2

## 2018-10-14 MED ORDER — DIAZEPAM 2 MG PO TABS
2.0000 mg | ORAL_TABLET | Freq: Two times a day (BID) | ORAL | Status: DC
  Administered 2018-10-14 – 2018-10-15 (×3): 2 mg via ORAL
  Filled 2018-10-14 (×3): qty 1

## 2018-10-14 MED ORDER — MORPHINE SULFATE (PF) 2 MG/ML IV SOLN
2.0000 mg | INTRAVENOUS | Status: DC | PRN
  Administered 2018-10-14 – 2018-10-25 (×41): 2 mg via INTRAVENOUS
  Filled 2018-10-14 (×42): qty 1

## 2018-10-14 NOTE — Progress Notes (Signed)
*  PRELIMINARY RESULTS* Echocardiogram 2D Echocardiogram has been performed.  [name] 10/14/2018, 10:02 AM

## 2018-10-14 NOTE — Plan of Care (Signed)
In am pt was emotional, crying, anxious, irritable and c/o pain all over. Oxycodone given for pain and xanax for anxiety with no improvement. Dr Anselm Jungling made aware. Toradol and valium initiated with great improvement. Pt rested well. Pt had I&D today to L 3rd toe. Dressing dry and intact.  Poor appetite.

## 2018-10-14 NOTE — Consult Note (Signed)
ORTHOPAEDIC CONSULTATION  REQUESTING PHYSICIAN: Vaughan Basta, *  Chief Complaint: pain all over  HPI: [name] is a 53 y.o. female who complains of multiple areas of pain. Please see H&P and ED notes for details. Denies any numbness, tingling or constitutional symptoms.  Past Medical History:  Diagnosis Date  . Anxiety   . Arthritis    PSORIATIC ARTHRITIS  . Back pain   . Colon polyp   . Depression   . Headache    MIGRAINE  . Ulcer    Past Surgical History:  Procedure Laterality Date  . BREAST REDUCTION SURGERY    . COLONOSCOPY WITH ESOPHAGOGASTRODUODENOSCOPY (EGD)    . COLONOSCOPY WITH PROPOFOL N/A 05/19/2016   Procedure: COLONOSCOPY WITH PROPOFOL;  Surgeon: Lollie Sails, MD;  Location: [facility] ENDOSCOPY;  Service: Endoscopy;  Laterality: N/A;  . COLONOSCOPY WITH PROPOFOL N/A 03/06/2018   Procedure: COLONOSCOPY WITH PROPOFOL;  Surgeon: Toledo, Benay Pike, MD;  Location: [facility] ENDOSCOPY;  Service: Gastroenterology;  Laterality: N/A;  . ESOPHAGOGASTRODUODENOSCOPY (EGD) WITH PROPOFOL N/A 03/06/2018   Procedure: ESOPHAGOGASTRODUODENOSCOPY (EGD) WITH PROPOFOL;  Surgeon: Toledo, Benay Pike, MD;  Location: [facility] ENDOSCOPY;  Service: Gastroenterology;  Laterality: N/A;  . foot spurs    . FRACTURE SURGERY    . MANDIBLE FRACTURE SURGERY    . TUBAL LIGATION     Social History   Socioeconomic History  . Marital status: Single    Spouse name: Not on file  . Number of children: Not on file  . Years of education: Not on file  . Highest education level: Not on file  Occupational History  . Not on file  Social Needs  . Financial resource strain: Not on file  . Food insecurity    Worry: Not on file    Inability: Not on file  . Transportation needs    Medical: Not on file    Non-medical: Not on file  Tobacco Use  . Smoking status: Former Smoker    Types: Cigarettes  . Smokeless tobacco: Never Used  Substance and Sexual Activity  . Alcohol use: Never    Frequency:  Never  . Drug use: Yes    Types: "Crack" cocaine, Marijuana  . Sexual activity: Not on file  Lifestyle  . Physical activity    Days per week: Not on file    Minutes per session: Not on file  . Stress: Not on file  Relationships  . Social Herbalist on phone: Not on file    Gets together: Not on file    Attends religious service: Not on file    Active member of club or organization: Not on file    Attends meetings of clubs or organizations: Not on file    Relationship status: Not on file  Other Topics Concern  . Not on file  Social History Narrative  . Not on file   Family History  Problem Relation Age of Onset  . Liver cancer Mother   . Liver disease Mother   . Cirrhosis Mother   . Alcohol abuse Mother   . Cancer Father   . Colon cancer Maternal Aunt    Allergies  Allergen Reactions  . Topiramate Hives   Prior to Admission medications   Medication Sig Start Date End Date Taking? Authorizing Provider  ALPRAZolam Duanne Moron) 1 MG tablet Take 1 mg by mouth 4 (four) times daily.   Yes [provider]  amphetamine-dextroamphetamine (ADDERALL) 30 MG tablet Take 30 mg by mouth 2 (two)  times daily.    Yes [provider]  buPROPion (WELLBUTRIN SR) 200 MG 12 hr tablet TK 1 T PO  QAM AND QD AT NOON 08/13/18  Yes [provider]  clotrimazole-betamethasone (LOTRISONE) cream APP TO AFFECTED EAR ONCE OR TWICE WEEKLY PRN FOR SKIN IRRITATION. 11/09/16  Yes [provider]  diazepam (VALIUM) 5 MG tablet Take 1 tablet (5 mg total) by mouth every 8 (eight) hours as needed for anxiety. 10/04/18  Yes Paulette Blanch, MD  escitalopram (LEXAPRO) 20 MG tablet Take 20 mg by mouth daily.    Yes [provider]  FLUoxetine (PROZAC) 20 MG capsule TK 1 C PO QD 08/13/18  Yes [provider]  guanFACINE (TENEX) 2 MG tablet Take 2 mg by mouth daily. 09/08/18  Yes [provider]  ibuprofen (ADVIL) 800 MG tablet Take 1 tablet (800 mg total) by  mouth every 8 (eight) hours as needed for moderate pain. 10/04/18  Yes Paulette Blanch, MD  meloxicam (MOBIC) 15 MG tablet Take 15 mg by mouth daily.   Yes [provider]  methocarbamol (ROBAXIN) 500 MG tablet Take 500 mg by mouth every 8 (eight) hours as needed for muscle spasms.   Yes [provider]  oxyCODONE-acetaminophen (PERCOCET/ROXICET) 5-325 MG tablet Take 1 tablet by mouth every 4 (four) hours as needed for severe pain. 10/04/18  Yes Paulette Blanch, MD   Dg Chest Portable 1 View  Result Date: 10/13/2018 CLINICAL DATA:  Lethargy and slurred speech. Recent crack cocaine use. EXAM: PORTABLE CHEST 1 VIEW COMPARISON:  None. FINDINGS: Lungs are hypoinflated demonstrate mild opacification over the left base which may be due to small effusion with atelectasis versus infection. Mild prominence of the right perihilar markings. Cardiomediastinal silhouette is within normal. Mild degenerative change of the spine. IMPRESSION: Left base opacification which may be due to small effusion with atelectasis versus infection. Electronically Signed   By: Marin Olp M.D.   On: 10/13/2018 14:53   Dg Knee Complete 4 Views Left  Result Date: 10/13/2018 CLINICAL DATA:  Knee EXAM: LEFT KNEE - COMPLETE 4+ VIEW COMPARISON:  None. FINDINGS: No fracture or malalignment. Minimal degenerative of the medial joint space. Trace knee effusion. IMPRESSION: No acute osseous abnormality.  Trace knee effusion Electronically Signed   By: Donavan Foil M.D.   On: 10/13/2018 14:58   Dg Foot Complete Left  Addendum Date: 10/13/2018   ADDENDUM REPORT: 10/13/2018 17:02 ADDENDUM: Request made to common specifically on third digit. Evaluation of third digit limited due to flexed positioning and bony overlap. Questionable lucency at the base of the third distal phalanx on one view which may represent subtle fracture versus small erosion. Electronically Signed   By: Donavan Foil M.D.   On: 10/13/2018 17:02   Result Date:  10/13/2018 CLINICAL DATA:  Foot pain EXAM: LEFT FOOT - COMPLETE 3+ VIEW COMPARISON:  11/28/2017 FINDINGS: No fracture or malalignment. Mild degenerative change at the first MTP joint. Small plantar calcaneal spur. IMPRESSION: No acute osseous abnormality Electronically Signed: By: Donavan Foil M.D. On: 10/13/2018 14:59   US Abdomen Limited Ruq  Result Date: 10/13/2018 CLINICAL DATA:  Elevated liver function test.  Sepsis. EXAM: ULTRASOUND ABDOMEN LIMITED RIGHT UPPER QUADRANT COMPARISON:  None. FINDINGS: Gallbladder: Contracted gallbladder with borderline wall thickening of 3.4 mm. No gallstones or sludge seen. No sonographic Murphy sign noted by sonographer. Common bile duct: Diameter: 4.6 mm Liver: No focal lesion identified. Within normal limits in parenchymal echogenicity. Portal vein  is patent on color Doppler imaging with normal direction of blood flow towards the liver. IMPRESSION: Contracted gallbladder with borderline wall thickening, which may be artifactual. Negative sonographic Murphy's sign. Normal appearance of the liver. Electronically Signed   By: Fidela Salisbury M.D.   On: 10/13/2018 16:29    Positive ROS: All other systems have been reviewed and were otherwise negative with the exception of those mentioned in the HPI and as above.  Physical Exam: General: Alert, no acute distress Cardiovascular: No pedal edema Respiratory: No cyanosis, no use of accessory musculature GI: No organomegaly, abdomen is soft and non-tender Skin: No lesions in the area of chief complaint Neurologic: Sensation intact distally Psychiatric: Patient is competent for consent with normal mood and affect Lymphatic: No axillary or cervical lymphadenopathy  MUSCULOSKELETAL: Left knee with moderate effusion, no warmth, no erythema. Mild joint line tenderness, ligaments stable. Aspiration site c/d/i.Marland Kitchen Compartments soft. Good cap refill. Motor and sensory intact distally. Minimal pain with passive  ROM  Assessment:   ICD-10-CM   1. Chronic pneumonia  J18.9   2. Toe infection  L08.9 CANCELED: DG Toe 3rd Left    CANCELED: DG Toe 3rd Left   Left knee pain  Plan: Patient with moderate effusion and underlying osteoarthritis. Aspiration with 54K cell count. Negative gram stain and negative culture to date.  Will continue to follow exam and cultures closely. May be a candidate for arthroscopic irrigation and debridement, but do not recommend at this time. Please call with questions.  Lovell Sheehan, MD    10/14/2018 6:28 PM

## 2018-10-14 NOTE — Consult Note (Signed)
[facility]                                                      Patient Demographics  [name], is a 53 y.o. female   MRN: [id number]   DOB - 1966-04-23  Admit Date - 10/13/2018    Outpatient Primary MD for the patient is Marguerita Merles, MD  Consult requested in the Hospital by Vaughan Basta, *, On 10/14/2018    Reason for consult abscess third toe left foot   With History of -  Past Medical History:  Diagnosis Date  . Anxiety   . Arthritis    PSORIATIC ARTHRITIS  . Back pain   . Colon polyp   . Depression   . Headache    MIGRAINE  . Ulcer       Past Surgical History:  Procedure Laterality Date  . BREAST REDUCTION SURGERY    . COLONOSCOPY WITH ESOPHAGOGASTRODUODENOSCOPY (EGD)    . COLONOSCOPY WITH PROPOFOL N/A 05/19/2016   Procedure: COLONOSCOPY WITH PROPOFOL;  Surgeon: Lollie Sails, MD;  Location: [facility] ENDOSCOPY;  Service: Endoscopy;  Laterality: N/A;  . COLONOSCOPY WITH PROPOFOL N/A 03/06/2018   Procedure: COLONOSCOPY WITH PROPOFOL;  Surgeon: Toledo, Benay Pike, MD;  Location: [facility] ENDOSCOPY;  Service: Gastroenterology;  Laterality: N/A;  . ESOPHAGOGASTRODUODENOSCOPY (EGD) WITH PROPOFOL N/A 03/06/2018   Procedure: ESOPHAGOGASTRODUODENOSCOPY (EGD) WITH PROPOFOL;  Surgeon: Toledo, Benay Pike, MD;  Location: [facility] ENDOSCOPY;  Service: Gastroenterology;  Laterality: N/A;  . foot spurs    . FRACTURE SURGERY    . MANDIBLE FRACTURE SURGERY    . TUBAL LIGATION      in for   Chief Complaint  Patient presents with  . Altered Mental Status     HPI  [name]  is a 53 y.o. female, hospitalized due to a potential septic knee joint and infection to the third toe of the left foot.  Patient has a history of cocaine abuse and tested positive on her urinalysis.    Review of Systems    In addition to the HPI above,  No  Fever-chills, No Headache, No changes with Vision or hearing, No problems swallowing food or Liquids, No Chest pain, Cough or Shortness of Breath, No Abdominal pain, No Nausea or Vommitting, Bowel movements are regular, No Blood in stool or Urine, No dysuria, No new skin rashes or bruises, No new joints pains-aches,  No new weakness, tingling, numbness in any extremity, No recent weight gain or loss, No polyuria, polydypsia or polyphagia, No significant Mental Stressors.  A full 10 point Review of Systems was done, except as stated above, all other Review of Systems were negative.   Social History Social History   Tobacco Use  . Smoking status: Former Smoker    Types: Cigarettes  . Smokeless tobacco: Never Used  Substance Use Topics  . Alcohol use: Never    Frequency: Never    Family History Family History  Problem Relation Age of Onset  . Liver cancer Mother   . Liver disease Mother   . Cirrhosis Mother   . Alcohol abuse Mother   . Cancer Father   . Colon cancer Maternal Aunt     Prior to Admission medications   Medication Sig Start Date End Date Taking? Authorizing Provider  ALPRAZolam Duanne Moron) 1 MG tablet  Take 1 mg by mouth 4 (four) times daily.    [provider]  amphetamine-dextroamphetamine (ADDERALL) 20 MG tablet Take 20 mg by mouth 2 (two) times daily.     [provider]  buPROPion (WELLBUTRIN XL) 300 MG 24 hr tablet Take 300 mg by mouth daily.    [provider]  clobetasol ointment (TEMOVATE) 0.08 % Apply 1 application 2 (two) times daily topically.    [provider]  clotrimazole-betamethasone (LOTRISONE) cream APP TO AFFECTED EAR ONCE OR TWICE WEEKLY PRN FOR SKIN IRRITATION. 11/09/16   [provider]  cyclobenzaprine (FLEXERIL) 10 MG tablet Take 10 mg by mouth 3 (three) times daily as needed for muscle spasms.    [provider]  diazepam (VALIUM) 5 MG tablet Take 1 tablet (5 mg total) by mouth every 8  (eight) hours as needed for anxiety. 10/04/18   Paulette Blanch, MD  diclofenac (FLECTOR) 1.3 % PTCH Place 1 patch onto the skin as needed.    [provider]  diclofenac sodium (VOLTAREN) 1 % GEL Apply topically as needed.    [provider]  escitalopram (LEXAPRO) 20 MG tablet Take 30 mg by mouth daily.     [provider]  etodolac (LODINE) 500 MG tablet Take 500 mg by mouth 2 (two) times daily.    [provider]  fluocinonide (LIDEX) 0.05 % external solution Apply 1 application topically as needed.    [provider]  fluticasone (FLONASE) 50 MCG/ACT nasal spray Place into both nostrils daily.    [provider]  HYDROcodone-acetaminophen (NORCO/VICODIN) 5-325 MG tablet Take 1 tablet by mouth every 6 (six) hours as needed for moderate pain. Patient not taking: Reported on 03/06/2018 01/20/18   Johnn Hai, PA-C  ibuprofen (ADVIL) 800 MG tablet Take 1 tablet (800 mg total) by mouth every 8 (eight) hours as needed for moderate pain. 10/04/18   Paulette Blanch, MD  omeprazole (PRILOSEC) 40 MG capsule Take 20 mg by mouth 2 (two) times daily before a meal.     [provider]  OXYCODONE HCL PO Take 10 mg by mouth 3 (three) times daily.    [provider]  oxyCODONE-acetaminophen (PERCOCET/ROXICET) 5-325 MG tablet Take 1 tablet by mouth every 4 (four) hours as needed for severe pain. 10/04/18   Paulette Blanch, MD    Anti-infectives (From admission, onward)   Start     Dose/Rate Route Frequency Ordered Stop   10/14/18 1800  vancomycin (VANCOCIN) IVPB 1000 mg/200 mL premix  Status:  Discontinued     1,000 mg 200 mL/hr over 60 Minutes Intravenous Every 24 hours 10/13/18 1645 10/14/18 0804   10/14/18 0900  ceFAZolin (ANCEF) IVPB 2g/100 mL premix     2 g 200 mL/hr over 30 Minutes Intravenous Every 8 hours 10/14/18 0804     10/13/18 2200  ceFEPIme (MAXIPIME) 2 g in sodium chloride 0.9 % 100 mL IVPB  Status:  Discontinued     2 g 200  mL/hr over 30 Minutes Intravenous Every 8 hours 10/13/18 1645 10/14/18 0804   10/13/18 1530  vancomycin (VANCOCIN) 1,500 mg in sodium chloride 0.9 % 500 mL IVPB     1,500 mg 250 mL/hr over 120 Minutes Intravenous  Once 10/13/18 1521 10/13/18 1949   10/13/18 1515  ceFEPIme (MAXIPIME) 2 g in sodium chloride 0.9 % 100 mL IVPB     2 g 200 mL/hr over 30 Minutes Intravenous  Once 10/13/18 1513 10/13/18 1617  10/13/18 1515  metroNIDAZOLE (FLAGYL) IVPB 500 mg     500 mg 100 mL/hr over 60 Minutes Intravenous  Once 10/13/18 1513 10/13/18 1724   10/13/18 1515  vancomycin (VANCOCIN) IVPB 1000 mg/200 mL premix  Status:  Discontinued     1,000 mg 200 mL/hr over 60 Minutes Intravenous  Once 10/13/18 1513 10/13/18 1521      Scheduled Meds: . diazepam  2 mg Oral BID  . escitalopram  30 mg Oral Daily  . fluticasone  1 spray Each Nare Daily  . ketorolac  15 mg Intravenous Q8H  . pantoprazole (PROTONIX) IV  40 mg Intravenous Q12H  . potassium chloride  40 mEq Oral Once   Continuous Infusions: . 0.9 % NaCl with KCl 20 mEq / L 20 mL/hr at 10/14/18 0750  .  ceFAZolin (ANCEF) IV 2 g (10/14/18 1102)   PRN Meds:.acetaminophen **OR** acetaminophen, ALPRAZolam, morphine injection, ondansetron **OR** ondansetron (ZOFRAN) IV, oxyCODONE  Allergies  Allergen Reactions  . Topiramate Hives    Physical Exam  Vitals  Blood pressure (!) 95/49, pulse 98, temperature 98.5 F (36.9 C), resp. rate 18, height 5\' 4"  (1.626 m), weight 80.7 kg, last menstrual period 01/22/2017, SpO2 98 %.  Lower Extremity exam:  Vascular: Palpable bilateral  Dermatological: Third toe left foot has a large bullous type of abscess on the digit with some cellulitis to the toe as well.  Neurological: Patient apparently has full sensation.  Ortho: No gross deformities other than some mild contracture to her toes.  Data Review  CBC Recent Labs  Lab 10/13/18 1419 10/14/18 0416  WBC 22.0* 18.9*  HGB 10.4* 9.9*  HCT 30.2*  29.9*  PLT 423* 391  MCV 90.1 92.6  MCH 31.0 30.7  MCHC 34.4 33.1  RDW 14.1 14.6   ------------------------------------------------------------------------------------------------------------------  Chemistries  Recent Labs  Lab 10/13/18 1419 10/14/18 0416  NA 127* 134*  K 2.8* 2.9*  CL 83* 93*  CO2 31 29  GLUCOSE 159* 128*  BUN 28* 25*  CREATININE 1.01* 0.83  CALCIUM 7.8* 7.3*  MG 2.6*  --   AST 88* 59*  ALT 47* 42  ALKPHOS 759* 584*  BILITOT 3.8* 2.9*   --------------Urinalysis    Component Value Date/Time   COLORURINE AMBER (A) 10/13/2018 1440   APPEARANCEUR CLOUDY (A) 10/13/2018 1440   APPEARANCEUR CLEAR 06/02/2011 1040   LABSPEC 1.014 10/13/2018 1440   LABSPEC 1.005 06/02/2011 1040   PHURINE 6.0 10/13/2018 1440   GLUCOSEU NEGATIVE 10/13/2018 1440   GLUCOSEU NEGATIVE 06/02/2011 1040   HGBUR LARGE (A) 10/13/2018 1440   BILIRUBINUR SMALL (A) 10/13/2018 1440   BILIRUBINUR NEGATIVE 06/02/2011 1040   KETONESUR NEGATIVE 10/13/2018 1440   PROTEINUR 100 (A) 10/13/2018 1440   NITRITE NEGATIVE 10/13/2018 1440   LEUKOCYTESUR MODERATE (A) 10/13/2018 1440   LEUKOCYTESUR NEGATIVE 06/02/2011 1040     Imaging results:   Dg Chest Portable 1 View  Result Date: 10/13/2018 CLINICAL DATA:  Lethargy and slurred speech. Recent crack cocaine use. EXAM: PORTABLE CHEST 1 VIEW COMPARISON:  None. FINDINGS: Lungs are hypoinflated demonstrate mild opacification over the left base which may be due to small effusion with atelectasis versus infection. Mild prominence of the right perihilar markings. Cardiomediastinal silhouette is within normal. Mild degenerative change of the spine. IMPRESSION: Left base opacification which may be due to small effusion with atelectasis versus infection. Electronically Signed   By: Marin Olp M.D.   On: 10/13/2018 14:53   Dg Knee Complete 4 Views Left  Result  Date: 10/13/2018 CLINICAL DATA:  Knee EXAM: LEFT KNEE - COMPLETE 4+ VIEW COMPARISON:  None.  FINDINGS: No fracture or malalignment. Minimal degenerative of the medial joint space. Trace knee effusion. IMPRESSION: No acute osseous abnormality.  Trace knee effusion Electronically Signed   By: Donavan Foil M.D.   On: 10/13/2018 14:58   Dg Foot Complete Left  Addendum Date: 10/13/2018   ADDENDUM REPORT: 10/13/2018 17:02 ADDENDUM: Request made to common specifically on third digit. Evaluation of third digit limited due to flexed positioning and bony overlap. Questionable lucency at the base of the third distal phalanx on one view which may represent subtle fracture versus small erosion. Electronically Signed   By: Donavan Foil M.D.   On: 10/13/2018 17:02   Result Date: 10/13/2018 CLINICAL DATA:  Foot pain EXAM: LEFT FOOT - COMPLETE 3+ VIEW COMPARISON:  11/28/2017 FINDINGS: No fracture or malalignment. Mild degenerative change at the first MTP joint. Small plantar calcaneal spur. IMPRESSION: No acute osseous abnormality Electronically Signed: By: Donavan Foil M.D. On: 10/13/2018 14:59   US Abdomen Limited Ruq  Result Date: 10/13/2018 CLINICAL DATA:  Elevated liver function test.  Sepsis. EXAM: ULTRASOUND ABDOMEN LIMITED RIGHT UPPER QUADRANT COMPARISON:  None. FINDINGS: Gallbladder: Contracted gallbladder with borderline wall thickening of 3.4 mm. No gallstones or sludge seen. No sonographic Murphy sign noted by sonographer. Common bile duct: Diameter: 4.6 mm Liver: No focal lesion identified. Within normal limits in parenchymal echogenicity. Portal vein is patent on color Doppler imaging with normal direction of blood flow towards the liver. IMPRESSION: Contracted gallbladder with borderline wall thickening, which may be artifactual. Negative sonographic Murphy's sign. Normal appearance of the liver. Electronically Signed   By: Fidela Salisbury M.D.   On: 10/13/2018 16:29    Assessment & Plan: I explained the patient she had a large abscess and subsequently at bedside after Betadine prep made a  small incision in the abscess to bolus area of the allowed approximately 4 cc of pus to drain out of the region.  I get a culture on this area today also.  Following the I&D I was able to put a wet-to-dry dressing on the area.  Underlying the abscess was the area on the skin at the DIP joint that appeared to have a deeper ulceration.  Plan to follow this and see if she develops any type of long chronic infection and see how well she does healing with this.  She has other concerns regarding a potential septic knee joints and the antibiotics are being given.  I will order some dressing changes for her and follow her again throughout her hospital stay.  Active Problems:   Sepsis [facility])   Family Communication: Plan discussed with patient and **  Albertine Patricia M.D on 10/14/2018 at 1:08 PM  Thank you for the consult, we will follow the patient with you in the Hospital.

## 2018-10-14 NOTE — Progress Notes (Signed)
[facility] NAME: [name]    MR#:  [id number]  DATE OF BIRTH:  12/01/1965  SUBJECTIVE:  CHIEF COMPLAINT:   Chief Complaint  Patient presents with  . Altered Mental Status   Came with AMS, c/o pain all over.  REVIEW OF SYSTEMS:  CONSTITUTIONAL: No fever, fatigue or weakness.  EYES: No blurred or double vision.  EARS, NOSE, AND THROAT: No tinnitus or ear pain.  RESPIRATORY: No cough, shortness of breath, wheezing or hemoptysis.  CARDIOVASCULAR: No chest pain, orthopnea, edema.  GASTROINTESTINAL: No nausea, vomiting, diarrhea or abdominal pain.  GENITOURINARY: No dysuria, hematuria.  ENDOCRINE: No polyuria, nocturia,  HEMATOLOGY: No anemia, easy bruising or bleeding SKIN: No rash or lesion. MUSCULOSKELETAL: No joint pain or arthritis.   NEUROLOGIC: No tingling, numbness, weakness.  PSYCHIATRY: No anxiety or depression.   ROS  DRUG ALLERGIES:   Allergies  Allergen Reactions  . Topiramate Hives    VITALS:  Blood pressure 118/74, pulse 91, temperature 98.4 F (36.9 C), temperature source Oral, resp. rate 20, height 5\' 4"  (1.626 m), weight 80.7 kg, last menstrual period 01/22/2017, SpO2 92 %.  PHYSICAL EXAMINATION:   GENERAL:  53 y.o.-year-old patient lying in the bed with no acute distress.  EYES: Pupils equal, round, reactive to light and accommodation. No scleral icterus. Extraocular muscles intact.  HEENT: Head atraumatic, normocephalic. Oropharynx and nasopharynx clear.  NECK:  Supple, no jugular venous distention. No thyroid enlargement, no tenderness.  LUNGS: Normal breath sounds bilaterally, no wheezing, rales,rhonchi or crepitation. No use of accessory muscles of respiration.  CARDIOVASCULAR: S1, S2 normal. No murmurs, rubs, or gallops.  ABDOMEN: Soft, nontender, distended. Bowel sounds present. No organomegaly or mass.  EXTREMITIES: No pedal edema, cyanosis, or clubbing.  Left knee less swollen after joint  effusion tapped. NEUROLOGIC: Cranial nerves II through XII are intact. Muscle strength 5/5 in all extremities. Sensation intact. Gait not checked.  PSYCHIATRIC: The patient is alert and oriented x 3.  SKIN: No rash, lesion, or ulcer. Left 3rd toe have swelling.   Marland Kitchen   Physical Exam LABORATORY PANEL:   CBC Recent Labs  Lab 10/14/18 0416  WBC 18.9*  HGB 9.9*  HCT 29.9*  PLT 391   ------------------------------------------------------------------------------------------------------------------  Chemistries  Recent Labs  Lab 10/13/18 1419 10/14/18 0416  NA 127* 134*  K 2.8* 2.9*  CL 83* 93*  CO2 31 29  GLUCOSE 159* 128*  BUN 28* 25*  CREATININE 1.01* 0.83  CALCIUM 7.8* 7.3*  MG 2.6*  --   AST 88* 59*  ALT 47* 42  ALKPHOS 759* 584*  BILITOT 3.8* 2.9*   ------------------------------------------------------------------------------------------------------------------  Cardiac Enzymes Recent Labs  Lab 10/13/18 1419  TROPONINI 0.05*   ------------------------------------------------------------------------------------------------------------------  RADIOLOGY:  Dg Chest Portable 1 View  Result Date: 10/13/2018 CLINICAL DATA:  Lethargy and slurred speech. Recent crack cocaine use. EXAM: PORTABLE CHEST 1 VIEW COMPARISON:  None. FINDINGS: Lungs are hypoinflated demonstrate mild opacification over the left base which may be due to small effusion with atelectasis versus infection. Mild prominence of the right perihilar markings. Cardiomediastinal silhouette is within normal. Mild degenerative change of the spine. IMPRESSION: Left base opacification which may be due to small effusion with atelectasis versus infection. Electronically Signed   By: Marin Olp M.D.   On: 10/13/2018 14:53   Dg Knee Complete 4 Views Left  Result Date: 10/13/2018 CLINICAL DATA:  Knee EXAM: LEFT KNEE - COMPLETE 4+ VIEW COMPARISON:  None. FINDINGS: No  fracture or malalignment. Minimal degenerative  of the medial joint space. Trace knee effusion. IMPRESSION: No acute osseous abnormality.  Trace knee effusion Electronically Signed   By: Donavan Foil M.D.   On: 10/13/2018 14:58   Dg Foot Complete Left  Addendum Date: 10/13/2018   ADDENDUM REPORT: 10/13/2018 17:02 ADDENDUM: Request made to common specifically on third digit. Evaluation of third digit limited due to flexed positioning and bony overlap. Questionable lucency at the base of the third distal phalanx on one view which may represent subtle fracture versus small erosion. Electronically Signed   By: Donavan Foil M.D.   On: 10/13/2018 17:02   Result Date: 10/13/2018 CLINICAL DATA:  Foot pain EXAM: LEFT FOOT - COMPLETE 3+ VIEW COMPARISON:  11/28/2017 FINDINGS: No fracture or malalignment. Mild degenerative change at the first MTP joint. Small plantar calcaneal spur. IMPRESSION: No acute osseous abnormality Electronically Signed: By: Donavan Foil M.D. On: 10/13/2018 14:59   US Abdomen Limited Ruq  Result Date: 10/13/2018 CLINICAL DATA:  Elevated liver function test.  Sepsis. EXAM: ULTRASOUND ABDOMEN LIMITED RIGHT UPPER QUADRANT COMPARISON:  None. FINDINGS: Gallbladder: Contracted gallbladder with borderline wall thickening of 3.4 mm. No gallstones or sludge seen. No sonographic Murphy sign noted by sonographer. Common bile duct: Diameter: 4.6 mm Liver: No focal lesion identified. Within normal limits in parenchymal echogenicity. Portal vein is patent on color Doppler imaging with normal direction of blood flow towards the liver. IMPRESSION: Contracted gallbladder with borderline wall thickening, which may be artifactual. Negative sonographic Murphy's sign. Normal appearance of the liver. Electronically Signed   By: Fidela Salisbury M.D.   On: 10/13/2018 16:29    ASSESSMENT AND PLAN:   Active Problems:   Sepsis ([place])  1.  Clinical sepsis with leukocytosis tachycardia and fever.  COVID-19 testing negative.  Urinalysis positive.  Patient  also has a left third toe that is red and inflamed.  Left knee was tapped with high WBCs in fluid  Aggressive antibiotics with vancomycin and cefepime.    get podiatry consultation.    need orthopedic consultation.  Follow-up blood cultures and urine culture. 2.  Elevated liver function tests.  Could be secondary to drugs.  Ultrasound of the right upper quadrant - no acute findings.  Follow-up liver enzymes.  May need further testing depending on results. 3.  Hyponatremia and hypokalemia.  IV fluids with potassium in normal saline.  Give oral K. 4.  Drug abuse.  Will put on low-dose Xanax for now to prevent withdrawal.  Low-dose oxycodone for pain. Give valium.  Will give IV toradol to help pain of septic joint and toe abscess. 5.  Anxiety depression continue usual medications.   6.  History of ulcers.  Will give Protonix IV.   All the records are reviewed and case discussed with Care Management/Social Workerr. Management plans discussed with the patient, family and they are in agreement.  CODE STATUS: Full.  TOTAL TIME TAKING CARE OF THIS PATIENT: 35 minutes.     POSSIBLE D/C IN 2-3 DAYS, DEPENDING ON CLINICAL CONDITION.   Vaughan Basta M.D on 10/14/2018   Between 7am to 6pm - Pager - [phone]  After 6pm go to [email or web address] - password [email or web address] [facility]  Office  [phone]  CC: Primary care physician; Marguerita Merles, MD  Note: This dictation was prepared with Dragon dictation along with smaller phrase technology. Any transcriptional errors that result from this process are unintentional.

## 2018-10-14 NOTE — Progress Notes (Signed)
PHARMACY - PHYSICIAN COMMUNICATION CRITICAL VALUE ALERT - BLOOD CULTURE IDENTIFICATION (BCID)  Steele Ledonne is an 53 y.o. female who presented to [facility] on 10/13/2018 with a chief complaint of Sepsis  Assessment:  2/4 Aerobic bottle positive for Staph Species,Staph Aureus, mecA not detected   Name of physician (or Provider) Contacted: Dr. Anselm Jungling  Current antibiotics: Cefepime,Vancomycin  Changes to prescribed antibiotics recommended:  Recommendations accepted by provider. Will D/C current regimen and initiate Cefazolin 2g Q8 hours.  Results for orders placed or performed during the hospital encounter of 10/13/18  Blood Culture ID Panel (Reflexed) (Collected: 10/13/2018  3:33 PM)  Result Value Ref Range   Enterococcus species NOT DETECTED NOT DETECTED   Listeria monocytogenes NOT DETECTED NOT DETECTED   Staphylococcus species DETECTED (A) NOT DETECTED   Staphylococcus aureus (BCID) DETECTED (A) NOT DETECTED   Methicillin resistance NOT DETECTED NOT DETECTED   Streptococcus species NOT DETECTED NOT DETECTED   Streptococcus agalactiae NOT DETECTED NOT DETECTED   Streptococcus pneumoniae NOT DETECTED NOT DETECTED   Streptococcus pyogenes NOT DETECTED NOT DETECTED   Acinetobacter baumannii NOT DETECTED NOT DETECTED   Enterobacteriaceae species NOT DETECTED NOT DETECTED   Enterobacter cloacae complex NOT DETECTED NOT DETECTED   Escherichia coli NOT DETECTED NOT DETECTED   Klebsiella oxytoca NOT DETECTED NOT DETECTED   Klebsiella pneumoniae NOT DETECTED NOT DETECTED   Proteus species NOT DETECTED NOT DETECTED   Serratia marcescens NOT DETECTED NOT DETECTED   Haemophilus influenzae NOT DETECTED NOT DETECTED   Neisseria meningitidis NOT DETECTED NOT DETECTED   Pseudomonas aeruginosa NOT DETECTED NOT DETECTED   Candida albicans NOT DETECTED NOT DETECTED   Candida glabrata NOT DETECTED NOT DETECTED   Candida krusei NOT DETECTED NOT DETECTED   Candida parapsilosis NOT DETECTED NOT  DETECTED   Candida tropicalis NOT DETECTED NOT DETECTED    Osborn Pullin A Tayra Dawe 10/14/2018  8:04 AM
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
## 2018-10-14 NOTE — Consult Note (Signed)
NAME: [name]  DOB: Sep 09, 1965  MRN: [id number]  Date/Time: 10/14/2018 8:19 AM  REQUESTING PROVIDER: Dr.Weiting Subjective:  REASON FOR CONSULT: Staph aureus bacteremia ?pt is a poor historian as she is lethargic and sleepy due to xanax, valium and oxycodone which she had received this morning [name] is a 53 y.o. female with a history of substance use, psoriasis and psoriatic arthritis presents from home brought in by EMS because of pain of her back and also left knee pain and pain to the middle toe of the left foot.  Patient on arrival was also lethargic with slurred speech.  In the ED her temperature initially was 97.9 but later went up to 101.5 blood pressure of 124/79 respiratory rate of 27, heart rate of 91 and pulse ox of 87%.  After sending blood culture she was started on vancomycin and cefepime.  At the left knee arthrocentesis was done and purulent fluid was obtained with 54,000 WBC predominantly neutrophils.  As the blood culture BC ID is staph aureus and seeing the patient. Her other labs indicate AST of 88, ALT of 47 and total bilirubin of 3.8.  Creatinine was 1.0.  He was 111 and potassium was 2.8.  WBC was 22. Chest x-ray revealed a left base opacification which could be due to either a small effusion with atelectasis versus infection. Patient had been to the ED on 10/02/2018 with neck pain.  She was discharged after assessment and thought to be muscle spasm and given Valium and Toradol , Roxicet in the emergency department and was discharged home on Robaxin and meloxicam.  She came back to the ED on 10/04/2018 with worsening pain and was thought to have left trapezius muscle spasm.  Was given IM Toradol Percocet and Valium in the ED and then sent home on Motrin.  No other tests were done in the ED.   Past medical history TMJ Depression Psoriasis Psoriatic arthritis had been on tumor necrosis factor inhibitor in the past Breast abscess due to staph aureus in 2015  Past surgical  history I/D of breast abscess Jaw alignment surgery following fracture Right foot surgery Breast reduction surgery  Social History   Socioeconomic History  . Marital status: Single    Spouse name: Not on file  . Number of children: Not on file  . Years of education: Not on file  . Highest education level: Not on file  Occupational History  . Not on file  Social Needs  . Financial resource strain: Not on file  . Food insecurity    Worry: Not on file    Inability: Not on file  . Transportation needs    Medical: Not on file    Non-medical: Not on file  Tobacco Use  . Smoking status: Former Smoker    Types: Cigarettes  . Smokeless tobacco: Never Used  Substance and Sexual Activity  . Alcohol use: Never    Frequency: Never  . Drug use: Yes    Types: "Crack" cocaine, Marijuana  . Sexual activity: Not on file  Lifestyle  . Physical activity    Days per week: Not on file    Minutes per session: Not on file  . Stress: Not on file  Relationships  . Social Herbalist on phone: Not on file    Gets together: Not on file    Attends religious service: Not on file    Active member of club or organization: Not on file    Attends meetings  of clubs or organizations: Not on file    Relationship status: Not on file  . Intimate partner violence    Fear of current or ex partner: Not on file    Emotionally abused: Not on file    Physically abused: Not on file    Forced sexual activity: Not on file  Other Topics Concern  . Not on file  Social History Narrative  . Not on file    Family History  Problem Relation Age of Onset  . Liver cancer Mother   . Liver disease Mother   . Cirrhosis Mother   . Alcohol abuse Mother   . Cancer Father   . Colon cancer Maternal Aunt    Allergies  Allergen Reactions  . Topiramate Hives    ? Current Facility-Administered Medications  Medication Dose Route Frequency Provider Last Rate Last Dose  . 0.9 % NaCl with KCl 20 mEq/ L   infusion   Intravenous Continuous Loletha Grayer, MD 20 mL/hr at 10/14/18 0750    . acetaminophen (TYLENOL) tablet 650 mg  650 mg Oral Q6H PRN Loletha Grayer, MD   650 mg at 10/14/18 0329   Or  . acetaminophen (TYLENOL) suppository 650 mg  650 mg Rectal Q6H PRN Loletha Grayer, MD      . ALPRAZolam Duanne Moron) tablet 0.25 mg  0.25 mg Oral TID PRN Loletha Grayer, MD   0.25 mg at 10/13/18 2020  . ceFAZolin (ANCEF) IVPB 2g/100 mL premix  2 g Intravenous Q8H Vaughan Basta, MD      . escitalopram (LEXAPRO) tablet 30 mg  30 mg Oral Daily Wieting, Richard, MD      . fluticasone (FLONASE) 50 MCG/ACT nasal spray 1 spray  1 spray Each Nare Daily Wieting, Richard, MD      . ondansetron [facility]) tablet 4 mg  4 mg Oral Q6H PRN Loletha Grayer, MD       Or  . ondansetron (ZOFRAN) injection 4 mg  4 mg Intravenous Q6H PRN Wieting, Richard, MD      . oxyCODONE (Oxy IR/ROXICODONE) immediate release tablet 5 mg  5 mg Oral Q4H PRN Wieting, Richard, MD      . pantoprazole (PROTONIX) injection 40 mg  40 mg Intravenous Q12H Loletha Grayer, MD   40 mg at 10/13/18 2235     Abtx:  Anti-infectives (From admission, onward)   Start     Dose/Rate Route Frequency Ordered Stop   10/14/18 1800  vancomycin (VANCOCIN) IVPB 1000 mg/200 mL premix  Status:  Discontinued     1,000 mg 200 mL/hr over 60 Minutes Intravenous Every 24 hours 10/13/18 1645 10/14/18 0804   10/14/18 0900  ceFAZolin (ANCEF) IVPB 2g/100 mL premix     2 g 200 mL/hr over 30 Minutes Intravenous Every 8 hours 10/14/18 0804     10/13/18 2200  ceFEPIme (MAXIPIME) 2 g in sodium chloride 0.9 % 100 mL IVPB  Status:  Discontinued     2 g 200 mL/hr over 30 Minutes Intravenous Every 8 hours 10/13/18 1645 10/14/18 0804   10/13/18 1530  vancomycin (VANCOCIN) 1,500 mg in sodium chloride 0.9 % 500 mL IVPB     1,500 mg 250 mL/hr over 120 Minutes Intravenous  Once 10/13/18 1521 10/13/18 1949   10/13/18 1515  ceFEPIme (MAXIPIME) 2 g in sodium chloride 0.9  % 100 mL IVPB     2 g 200 mL/hr over 30 Minutes Intravenous  Once 10/13/18 1513 10/13/18 1617   10/13/18 1515  metroNIDAZOLE (FLAGYL) IVPB  500 mg     500 mg 100 mL/hr over 60 Minutes Intravenous  Once 10/13/18 1513 10/13/18 1724   10/13/18 1515  vancomycin (VANCOCIN) IVPB 1000 mg/200 mL premix  Status:  Discontinued     1,000 mg 200 mL/hr over 60 Minutes Intravenous  Once 10/13/18 1513 10/13/18 1521      REVIEW OF SYSTEMS:   Na as patient is lethargic Objective:  VITALS:  BP (!) 95/49 (BP Location: Right Arm)   Pulse 98   Temp 98.5 F (36.9 C)   Resp 18   Ht 5\' 4"  (1.626 m)   Wt 80.7 kg   LMP 01/22/2017 Comment: no period in a year   SpO2 98%   BMI 30.55 kg/m  PHYSICAL EXAM:  General: lethargic , on calling her name she spoke to me and went back to sleep- she followed commands appropriately but I could not keep her awake to answer all questions Head: Normocephalic, without obvious abnormality, atraumatic. Eyes: Conjunctivae clear, anicteric sclerae. Pupils are equal ENT Nares normal. No drainage or sinus tenderness. Very dry mucosa Neck: Supple, symmetrical, no adenopathy, thyroid: non tender no carotid bruit and no JVD. Back: No CVA tenderness. Lungs: b/l air entry Heart: s1s2 Abdomen: Soft, non-tender,not distended. Bowel sounds normal. No masses Extremities:  Left 3 rd toe, s/p I/D and has a dressing Left knee less swollen, she is moving the leg    Skin: No rashes or lesions. Or bruising Lymph: Cervical, supraclavicular normal. Neurologic: Grossly non-focal Pertinent Labs Lab Results CBC    Component Value Date/Time   WBC 18.9 (H) 10/14/2018 0416   RBC 3.23 (L) 10/14/2018 0416   HGB 9.9 (L) 10/14/2018 0416   HGB 12.8 06/02/2011 1038   HCT 29.9 (L) 10/14/2018 0416   HCT 38.5 06/02/2011 1038   PLT 391 10/14/2018 0416   PLT 344 06/02/2011 1038   MCV 92.6 10/14/2018 0416   MCV 99 06/02/2011 1038   MCH 30.7 10/14/2018 0416   MCHC 33.1 10/14/2018 0416    RDW 14.6 10/14/2018 0416   RDW 13.2 06/02/2011 1038   LYMPHSABS 2.5 12/16/2016 0455   LYMPHSABS 3.7 (H) 06/02/2011 1038   MONOABS 0.9 12/16/2016 0455   MONOABS 0.9 (H) 06/02/2011 1038   EOSABS 0.7 12/16/2016 0455   EOSABS 1.0 (H) 06/02/2011 1038   BASOSABS 0.1 12/16/2016 0455   BASOSABS 0.1 06/02/2011 1038    CMP Latest Ref Rng & Units 10/14/2018 10/13/2018 12/16/2016  Glucose 70 - 99 mg/dL 128(H) 159(H) 115(H)  BUN 6 - 20 mg/dL 25(H) 28(H) 13  Creatinine 0.44 - 1.00 mg/dL 0.83 1.01(H) 0.61  Sodium 135 - 145 mmol/L 134(L) 127(L) 139  Potassium 3.5 - 5.1 mmol/L 2.9(L) 2.8(L) 3.6  Chloride 98 - 111 mmol/L 93(L) 83(L) 106  CO2 22 - 32 mmol/L 29 31 27   Calcium 8.9 - 10.3 mg/dL 7.3(L) 7.8(L) 8.7(L)  Total Protein 6.5 - 8.1 g/dL 5.4(L) 6.1(L) -  Total Bilirubin 0.3 - 1.2 mg/dL 2.9(H) 3.8(H) -  Alkaline Phos 38 - 126 U/L 584(H) 759(H) -  AST 15 - 41 U/L 59(H) 88(H) -  ALT 0 - 44 U/L 42 47(H) -      Microbiology: Recent Results (from the past 240 hour(s))  SARS Coronavirus 2 (CEPHEID - Performed in [place] hospital lab), Hosp Order     Status: None   Collection Time: 10/13/18  2:40 PM   Specimen: Nasopharyngeal Swab  Result Value Ref Range Status   SARS Coronavirus 2 NEGATIVE NEGATIVE Final  Comment: (NOTE) If result is NEGATIVE SARS-CoV-2 target nucleic acids are NOT DETECTED. The SARS-CoV-2 RNA is generally detectable in upper and lower  respiratory specimens during the acute phase of infection. The lowest  concentration of SARS-CoV-2 viral copies this assay can detect is 250  copies / mL. A negative result does not preclude SARS-CoV-2 infection  and should not be used as the sole basis for treatment or other  patient management decisions.  A negative result may occur with  improper specimen collection / handling, submission of specimen other  than nasopharyngeal swab, presence of viral mutation(s) within the  areas targeted by this assay, and inadequate number of viral  copies  (<250 copies / mL). A negative result must be combined with clinical  observations, patient history, and epidemiological information. If result is POSITIVE SARS-CoV-2 target nucleic acids are DETECTED. The SARS-CoV-2 RNA is generally detectable in upper and lower  respiratory specimens dur ing the acute phase of infection.  Positive  results are indicative of active infection with SARS-CoV-2.  Clinical  correlation with patient history and other diagnostic information is  necessary to determine patient infection status.  Positive results do  not rule out bacterial infection or co-infection with other viruses. If result is PRESUMPTIVE POSTIVE SARS-CoV-2 nucleic acids MAY BE PRESENT.   A presumptive positive result was obtained on the submitted specimen  and confirmed on repeat testing.  While 2019 novel coronavirus  (SARS-CoV-2) nucleic acids may be present in the submitted sample  additional confirmatory testing may be necessary for epidemiological  and / or clinical management purposes  to differentiate between  SARS-CoV-2 and other Sarbecovirus currently known to infect humans.  If clinically indicated additional testing with an alternate test  methodology [id number]) is advised. The SARS-CoV-2 RNA is generally  detectable in upper and lower respiratory sp ecimens during the acute  phase of infection. The expected result is Negative. Fact Sheet for Patients:  StrictlyIdeas.no Fact Sheet for Healthcare Providers: BankingDealers.co.za This test is not yet approved or cleared by the [place] FDA and has been authorized for detection and/or diagnosis of SARS-CoV-2 by FDA under an Emergency Use Authorization (EUA).  This EUA will remain in effect (meaning this test can be used) for the duration of the COVID-19 declaration under Section 564(b)(1) of the Act, 21 U.S.C. section 360bbb-3(b)(1), unless the authorization is terminated  or revoked sooner. Performed at [facility], [address] [place], [place] [address]   Body fluid culture     Status: None (Preliminary result)   Collection Time: 10/13/18  2:48 PM   Specimen: Synovium; Body Fluid  Result Value Ref Range Status   Specimen Description   Final    SYNOVIAL FLUID Performed at [facility], [address], [place], [place] [address]    Special Requests   Final    NONE Performed at [facility], [address] [place], [place] [address]    Gram Stain   Final    ABUNDANT WBC PRESENT, PREDOMINANTLY PMN NO ORGANISMS SEEN Performed at [facility], [place] [address], [place], [place] [address]    Culture PENDING  Incomplete   Report Status PENDING  Incomplete  Blood culture (routine x 2)     Status: None (Preliminary result)   Collection Time: 10/13/18  3:32 PM   Specimen: BLOOD  Result Value Ref Range Status   Specimen Description BLOOD L WRIST  Final   Special Requests   Final  BOTTLES DRAWN AEROBIC AND ANAEROBIC Blood Culture results may not be optimal due to an inadequate volume of blood received in culture bottles   Culture  Setup Time   Final    GRAM POSITIVE COCCI AEROBIC BOTTLE ONLY CRITICAL VALUE NOTED.  VALUE IS CONSISTENT WITH PREVIOUSLY REPORTED AND CALLED VALUE. Performed at [facility], [address] [place], [place] [address]    Culture [facility] POSITIVE COCCI  Final   Report Status PENDING  Incomplete  Blood culture (routine x 2)     Status: None (Preliminary result)   Collection Time: 10/13/18  3:33 PM   Specimen: BLOOD  Result Value Ref Range Status   Specimen Description BLOOD R WRIST  Final   Special Requests   Final    BOTTLES DRAWN AEROBIC AND ANAEROBIC Blood Culture results may not be optimal due to an inadequate volume of blood received in culture bottles   Culture  Setup Time   Final    Organism ID to follow GRAM POSITIVE COCCI AEROBIC BOTTLE ONLY CRITICAL RESULT CALLED TO,  READ BACK BY AND VERIFIED WITH: WALID NAZARI @0736  10/14/18 AKT Performed at [facility], [address] [place], [place] [address]    Culture GRAM POSITIVE COCCI  Final   Report Status PENDING  Incomplete  Blood Culture ID Panel (Reflexed)     Status: Abnormal   Collection Time: 10/13/18  3:33 PM  Result Value Ref Range Status   Enterococcus species NOT DETECTED NOT DETECTED Final   Listeria monocytogenes NOT DETECTED NOT DETECTED Final   Staphylococcus species DETECTED (A) NOT DETECTED Final    Comment: CRITICAL RESULT CALLED TO, READ BACK BY AND VERIFIED WITH: WALID NAZARI @0736  10/14/18 AKT    Staphylococcus aureus (BCID) DETECTED (A) NOT DETECTED Final    Comment: Methicillin (oxacillin) susceptible Staphylococcus aureus (MSSA). Preferred therapy is anti staphylococcal beta lactam antibiotic (Cefazolin or Nafcillin), unless clinically contraindicated. CRITICAL RESULT CALLED TO, READ BACK BY AND VERIFIED WITH: WALID NAZARI @0736  10/14/18 AKT    Methicillin resistance NOT DETECTED NOT DETECTED Final   Streptococcus species NOT DETECTED NOT DETECTED Final   Streptococcus agalactiae NOT DETECTED NOT DETECTED Final   Streptococcus pneumoniae NOT DETECTED NOT DETECTED Final   Streptococcus pyogenes NOT DETECTED NOT DETECTED Final   Acinetobacter baumannii NOT DETECTED NOT DETECTED Final   Enterobacteriaceae species NOT DETECTED NOT DETECTED Final   Enterobacter cloacae complex NOT DETECTED NOT DETECTED Final   Escherichia coli NOT DETECTED NOT DETECTED Final   Klebsiella oxytoca NOT DETECTED NOT DETECTED Final   Klebsiella pneumoniae NOT DETECTED NOT DETECTED Final   Proteus species NOT DETECTED NOT DETECTED Final   Serratia marcescens NOT DETECTED NOT DETECTED Final   Haemophilus influenzae NOT DETECTED NOT DETECTED Final   Neisseria meningitidis NOT DETECTED NOT DETECTED Final   Pseudomonas aeruginosa NOT DETECTED NOT DETECTED Final   Candida albicans NOT DETECTED NOT  DETECTED Final   Candida glabrata NOT DETECTED NOT DETECTED Final   Candida krusei NOT DETECTED NOT DETECTED Final   Candida parapsilosis NOT DETECTED NOT DETECTED Final   Candida tropicalis NOT DETECTED NOT DETECTED Final    Comment: Performed at [facility], [address] [place], [place] [address]    IMAGING RESULTS:  I have personally reviewed the films ? Impression/Recommendation ? ?53 year old female with history of substance use presents with left knee pain, left toe pain and purplish discoloration and also lethargy.  Also has been having back pain especially neck pain for which  she had 2 prior ED visits on 6/10 and 10/04/2018.  Staph aureus bacteremia.  In a patient with cocaine use  ( She denies IVDA) she likely has widespread staph infection.  The left knee is septic , so is  the left toe.  Concern for endocarditis.  Septic emboli to the toe.  We will do 2D echo first and may need TEE .  Also concern for cervical spine infection so will need MRI. ?  Abnormal LFTs  AST more than ALT.  Check CK and also hepatitis panel.  We will also get HIV.  ID will follow along  Note:  This document was prepared using Dragon voice recognition software and may include unintentional dictation errors.

## 2018-10-15 ENCOUNTER — Inpatient Hospital Stay: Payer: Medicaid Other

## 2018-10-15 DIAGNOSIS — F1910 Other psychoactive substance abuse, uncomplicated: Secondary | ICD-10-CM

## 2018-10-15 DIAGNOSIS — M0009 Staphylococcal polyarthritis: Secondary | ICD-10-CM

## 2018-10-15 DIAGNOSIS — F1490 Cocaine use, unspecified, uncomplicated: Secondary | ICD-10-CM

## 2018-10-15 LAB — CBC
HCT: 29.4 % — ABNORMAL LOW (ref 36.0–46.0)
Hemoglobin: 9.8 g/dL — ABNORMAL LOW (ref 12.0–15.0)
MCH: 31.1 pg (ref 26.0–34.0)
MCHC: 33.3 g/dL (ref 30.0–36.0)
MCV: 93.3 fL (ref 80.0–100.0)
Platelets: 504 10*3/uL — ABNORMAL HIGH (ref 150–400)
RBC: 3.15 MIL/uL — ABNORMAL LOW (ref 3.87–5.11)
RDW: 14.8 % (ref 11.5–15.5)
WBC: 22.1 10*3/uL — ABNORMAL HIGH (ref 4.0–10.5)
nRBC: 0 % (ref 0.0–0.2)

## 2018-10-15 LAB — HEPATITIS PANEL, ACUTE
HCV Ab: <0.1 s/co ratio (ref 0.0–0.9)
Hep A IgM: NEGATIVE
Hep B C IgM: NEGATIVE
Hepatitis B Surface Ag: NEGATIVE

## 2018-10-15 LAB — CULTURE, BLOOD (ROUTINE X 2)

## 2018-10-15 LAB — COMPREHENSIVE METABOLIC PANEL
ALT: 25 U/L (ref 0–44)
AST: 23 U/L (ref 15–41)
Albumin: 1.8 g/dL — ABNORMAL LOW (ref 3.5–5.0)
Alkaline Phosphatase: 458 U/L — ABNORMAL HIGH (ref 38–126)
Anion gap: 8 (ref 5–15)
BUN: 15 mg/dL (ref 6–20)
CO2: 25 mmol/L (ref 22–32)
Calcium: 7.5 mg/dL — ABNORMAL LOW (ref 8.9–10.3)
Chloride: 98 mmol/L (ref 98–111)
Creatinine, Ser: 0.63 mg/dL (ref 0.44–1.00)
GFR calc Af Amer: >60 mL/min (ref >60)
GFR calc non Af Amer: >60 mL/min (ref >60)
Glucose, Bld: 108 mg/dL — ABNORMAL HIGH (ref 70–99)
Potassium: 3.9 mmol/L (ref 3.5–5.1)
Sodium: 131 mmol/L — ABNORMAL LOW (ref 135–145)
Total Bilirubin: 1.6 mg/dL — ABNORMAL HIGH (ref 0.3–1.2)
Total Protein: 5.3 g/dL — ABNORMAL LOW (ref 6.5–8.1)

## 2018-10-15 LAB — URINE CULTURE: Culture: >=100000 — AB

## 2018-10-15 LAB — HIV ANTIBODY (ROUTINE TESTING W REFLEX)
HIV Screen 4th Generation wRfx: NONREACTIVE
HIV Screen 4th Generation wRfx: NONREACTIVE

## 2018-10-15 MED ORDER — ZIPRASIDONE MESYLATE 20 MG IM SOLR
10.0000 mg | Freq: Four times a day (QID) | INTRAMUSCULAR | Status: DC | PRN
  Administered 2018-10-15 – 2018-10-24 (×4): 10 mg via INTRAMUSCULAR
  Filled 2018-10-15 (×6): qty 20

## 2018-10-15 MED ORDER — DIAZEPAM 2 MG PO TABS
1.0000 mg | ORAL_TABLET | Freq: Two times a day (BID) | ORAL | Status: DC
  Administered 2018-10-15 – 2018-11-11 (×53): 1 mg via ORAL
  Filled 2018-10-15 (×53): qty 1

## 2018-10-15 MED ORDER — SODIUM CHLORIDE 0.9% FLUSH
10.0000 mL | INTRAVENOUS | Status: DC | PRN
  Administered 2018-10-31: 10 mL
  Filled 2018-10-15: qty 40

## 2018-10-15 MED ORDER — PANTOPRAZOLE SODIUM 40 MG PO TBEC
40.0000 mg | DELAYED_RELEASE_TABLET | Freq: Two times a day (BID) | ORAL | Status: DC
  Administered 2018-10-15 – 2018-11-11 (×54): 40 mg via ORAL
  Filled 2018-10-15 (×53): qty 1

## 2018-10-15 MED ORDER — SODIUM CHLORIDE 0.9% FLUSH
10.0000 mL | Freq: Two times a day (BID) | INTRAVENOUS | Status: DC
  Administered 2018-10-17 – 2018-11-10 (×29): 10 mL

## 2018-10-15 MED ORDER — LORAZEPAM 2 MG/ML IJ SOLN
2.0000 mg | Freq: Once | INTRAMUSCULAR | Status: AC
  Administered 2018-10-15: 23:00:00 2 mg via INTRAVENOUS
  Filled 2018-10-15: qty 1

## 2018-10-15 NOTE — Progress Notes (Signed)
PHARMACIST - PHYSICIAN COMMUNICATION  DR:   Anselm Jungling  CONCERNING: IV to Oral Route Change Policy  RECOMMENDATION: This patient is receiving protonix by the intravenous route.  Based on criteria approved by the Pharmacy and Therapeutics Committee, the intravenous medication(s) is/are being converted to the equivalent oral dose form(s).   DESCRIPTION: These criteria include:  The patient is eating (either orally or via tube) and/or has been taking other orally administered medications for a least 24 hours  The patient has no evidence of active gastrointestinal bleeding or impaired GI absorption (gastrectomy, short bowel, patient on TNA or NPO).  If you have questions about this conversion, please contact the Pharmacy Department  []   [phone] )  [facility] [x]   [phone] )  [facility] []   [phone] )  [facility] []   [phone] )  [facility] []   [phone] )  [place], [facility] 10/15/2018 9:55 AM

## 2018-10-15 NOTE — Progress Notes (Signed)
[facility] NAME: [name]    MR#:  [id number]  DATE OF BIRTH:  07-18-1965  SUBJECTIVE:  CHIEF COMPLAINT:   Chief Complaint  Patient presents with  . Altered Mental Status   Came with AMS, c/o pain all over.  REVIEW OF SYSTEMS:  CONSTITUTIONAL: No fever, fatigue or weakness.  EYES: No blurred or double vision.  EARS, NOSE, AND THROAT: No tinnitus or ear pain.  RESPIRATORY: No cough, shortness of breath, wheezing or hemoptysis.  CARDIOVASCULAR: No chest pain, orthopnea, edema.  GASTROINTESTINAL: No nausea, vomiting, diarrhea or abdominal pain.  GENITOURINARY: No dysuria, hematuria.  ENDOCRINE: No polyuria, nocturia,  HEMATOLOGY: No anemia, easy bruising or bleeding SKIN: No rash or lesion. MUSCULOSKELETAL: No joint pain or arthritis.   NEUROLOGIC: No tingling, numbness, weakness.  PSYCHIATRY: No anxiety or depression.   ROS  DRUG ALLERGIES:   Allergies  Allergen Reactions  . Topiramate Hives    VITALS:  Blood pressure 128/75, pulse 75, temperature 97.7 F (36.5 C), temperature source Oral, resp. rate 18, height 5\' 4"  (1.626 m), weight 80.7 kg, last menstrual period 01/22/2017, SpO2 98 %.  PHYSICAL EXAMINATION:   GENERAL:  53 y.o.-year-old patient lying in the bed with no acute distress.  EYES: Pupils equal, round, reactive to light and accommodation. No scleral icterus. Extraocular muscles intact.  HEENT: Head atraumatic, normocephalic. Oropharynx and nasopharynx clear.  NECK:  Supple, no jugular venous distention. No thyroid enlargement, no tenderness.  LUNGS: Normal breath sounds bilaterally, no wheezing, rales,rhonchi or crepitation. No use of accessory muscles of respiration.  CARDIOVASCULAR: S1, S2 normal. No murmurs, rubs, or gallops.  ABDOMEN: Soft, nontender, distended. Bowel sounds present. No organomegaly or mass.  EXTREMITIES: No pedal edema, cyanosis, or clubbing.  Left knee less swollen after joint  effusion tapped. NEUROLOGIC: Cranial nerves II through XII are intact. Muscle strength 5/5 in all extremities. Sensation intact. Gait not checked.  PSYCHIATRIC: The patient is alert and oriented x 3.  SKIN: No rash, lesion, or ulcer. Left 3rd toe have swelling. s/p I & D on toe- dressing in place. Marland Kitchen   Physical Exam LABORATORY PANEL:   CBC Recent Labs  Lab 10/15/18 0906  WBC 22.1*  HGB 9.8*  HCT 29.4*  PLT 504*   ------------------------------------------------------------------------------------------------------------------  Chemistries  Recent Labs  Lab 10/13/18 1419  10/15/18 0906  NA 127*   < > 131*  K 2.8*   < > 3.9  CL 83*   < > 98  CO2 31   < > 25  GLUCOSE 159*   < > 108*  BUN 28*   < > 15  CREATININE 1.01*   < > 0.63  CALCIUM 7.8*   < > 7.5*  MG 2.6*  --   --   AST 88*   < > 23  ALT 47*   < > 25  ALKPHOS 759*   < > 458*  BILITOT 3.8*   < > 1.6*   < > = values in this interval not displayed.   ------------------------------------------------------------------------------------------------------------------  Cardiac Enzymes Recent Labs  Lab 10/13/18 1419  TROPONINI 0.05*   ------------------------------------------------------------------------------------------------------------------  RADIOLOGY:  Dg Chest Portable 1 View  Result Date: 10/13/2018 CLINICAL DATA:  Lethargy and slurred speech. Recent crack cocaine use. EXAM: PORTABLE CHEST 1 VIEW COMPARISON:  None. FINDINGS: Lungs are hypoinflated demonstrate mild opacification over the left base which may be due to small effusion with atelectasis versus infection. Mild prominence of the right  perihilar markings. Cardiomediastinal silhouette is within normal. Mild degenerative change of the spine. IMPRESSION: Left base opacification which may be due to small effusion with atelectasis versus infection. Electronically Signed   By: Marin Olp M.D.   On: 10/13/2018 14:53   Dg Knee Complete 4 Views  Left  Result Date: 10/13/2018 CLINICAL DATA:  Knee EXAM: LEFT KNEE - COMPLETE 4+ VIEW COMPARISON:  None. FINDINGS: No fracture or malalignment. Minimal degenerative of the medial joint space. Trace knee effusion. IMPRESSION: No acute osseous abnormality.  Trace knee effusion Electronically Signed   By: Donavan Foil M.D.   On: 10/13/2018 14:58   Dg Foot Complete Left  Addendum Date: 10/13/2018   ADDENDUM REPORT: 10/13/2018 17:02 ADDENDUM: Request made to common specifically on third digit. Evaluation of third digit limited due to flexed positioning and bony overlap. Questionable lucency at the base of the third distal phalanx on one view which may represent subtle fracture versus small erosion. Electronically Signed   By: Donavan Foil M.D.   On: 10/13/2018 17:02   Result Date: 10/13/2018 CLINICAL DATA:  Foot pain EXAM: LEFT FOOT - COMPLETE 3+ VIEW COMPARISON:  11/28/2017 FINDINGS: No fracture or malalignment. Mild degenerative change at the first MTP joint. Small plantar calcaneal spur. IMPRESSION: No acute osseous abnormality Electronically Signed: By: Donavan Foil M.D. On: 10/13/2018 14:59   US Abdomen Limited Ruq  Result Date: 10/13/2018 CLINICAL DATA:  Elevated liver function test.  Sepsis. EXAM: ULTRASOUND ABDOMEN LIMITED RIGHT UPPER QUADRANT COMPARISON:  None. FINDINGS: Gallbladder: Contracted gallbladder with borderline wall thickening of 3.4 mm. No gallstones or sludge seen. No sonographic Murphy sign noted by sonographer. Common bile duct: Diameter: 4.6 mm Liver: No focal lesion identified. Within normal limits in parenchymal echogenicity. Portal vein is patent on color Doppler imaging with normal direction of blood flow towards the liver. IMPRESSION: Contracted gallbladder with borderline wall thickening, which may be artifactual. Negative sonographic Murphy's sign. Normal appearance of the liver. Electronically Signed   By: Fidela Salisbury M.D.   On: 10/13/2018 16:29    ASSESSMENT AND  PLAN:   Active Problems:   Sepsis ([place])  1.  Clinical sepsis with leukocytosis tachycardia and fever.  Staph bacteremia  COVID-19 testing negative.  Urinalysis positive.  Patient also has a left third toe that is red and inflamed.  Left knee was tapped with high WBCs in fluid  Aggressive antibiotics with vancomycin and cefepime.   Appreciated podiatry consultation- I & D on toe done. Appreciated orthopedic consultation.       Have 54000 WBCs in knee aspirate, Ortho planning for Knee washing.     Pt is stable for that, she is a drug abuser and going through some withdrawal, but vitals stable. I will re-check Urine toxicology tomorrow morning as anesthesia might need that.  Per ID- need TEE to r/o Inf endocarditis- consult cardio. I will get MRI cervical and thorasic spine - she may need to be sedated for that due to pain.  2.  Elevated liver function tests.  Could be secondary to drugs.  Ultrasound of the right upper quadrant - no acute findings.  Follow-up liver enzymes.  Coming down.  Will order PT, INR for tomorrow morning in view of procedure.  3.  Hyponatremia and hypokalemia.  IV fluids with potassium in normal saline.  Give oral K. Improved.  4.  Drug abuse.  Will put on low-dose Xanax for now to prevent withdrawal.  Low-dose oxycodone for pain. Give valium.  Will give  IV toradol to help pain of septic joint and toe abscess.   Psych and social work to help manage rehab/ child custody issues.  5.  Anxiety depression continue usual medications.   6.  History of ulcers.  Will give Protonix IV.   All the records are reviewed and case discussed with Care Management/Social Workerr. Management plans discussed with the patient, family and they are in agreement.  CODE STATUS: Full.  TOTAL TIME TAKING CARE OF THIS PATIENT: 35 minutes.  Spoke to patient's sister on phone today.  She is concerned about patient's children due to her drug abuse.  I will call social work consult.  Sister  also have concern and was asking about any drug rehab if patient can be assigned due to her worsening drug use.  I will call psychiatric consult for that.   POSSIBLE D/C IN 2-3 DAYS, DEPENDING ON CLINICAL CONDITION.   Vaughan Basta M.D on 10/15/2018   Between 7am to 6pm - Pager - [phone]  After 6pm go to [email or web address] - password [email or web address] [facility]  Office  [phone]  CC: Primary care physician; Marguerita Merles, MD  Note: This dictation was prepared with Dragon dictation along with smaller phrase technology. Any transcriptional errors that result from this process are unintentional.

## 2018-10-15 NOTE — Progress Notes (Signed)
Date of Admission:  10/13/2018      Subjective: Continues to have neck and back pain Still tired and sleepy  Medications:  . diazepam  2 mg Oral BID  . escitalopram  30 mg Oral Daily  . fluticasone  1 spray Each Nare Daily  . ketorolac  15 mg Intravenous Q8H  . pantoprazole  40 mg Oral BID    Objective: Vital signs in last 24 hours: Temp:  [97.7 F (36.5 C)-98.4 F (36.9 C)] 97.7 F (36.5 C) (06/23 0828) Pulse Rate:  [75-91] 75 (06/23 0828) Resp:  [16-20] 18 (06/23 0828) BP: (74-152)/(45-90) 128/75 (06/23 0828) SpO2:  [90 %-98 %] 98 % (06/23 0828)  PHYSICAL EXAM:  General:sleepy, but responds appropriately Left knee swollen Skin: No rashes or lesions. Or bruising Lymph: Cervical, supraclavicular normal. Neurologic: Grossly non-focal  Lab Results Recent Labs    10/14/18 0416 10/15/18 0906  WBC 18.9* 22.1*  HGB 9.9* 9.8*  HCT 29.9* 29.4*  NA 134* 131*  K 2.9* 3.9  CL 93* 98  CO2 29 25  BUN 25* 15  CREATININE 0.83 0.63   Liver Panel Recent Labs    10/14/18 0416 10/15/18 0906  PROT 5.4* 5.3*  ALBUMIN 1.9* 1.8*  AST 59* 23  ALT 42 25  ALKPHOS 584* 458*  BILITOT 2.9* 1.6*  BILIDIR 2.1*  --   IBILI 0.8  --    Sedimentation Rate Recent Labs    10/14/18 0900  ESRSEDRATE 56*   C-Reactive Protein No results for input(s): CRP in the last 72 hours.  Microbiology:  Studies/Results: Dg Chest Portable 1 View  Result Date: 10/13/2018 CLINICAL DATA:  Lethargy and slurred speech. Recent crack cocaine use. EXAM: PORTABLE CHEST 1 VIEW COMPARISON:  None. FINDINGS: Lungs are hypoinflated demonstrate mild opacification over the left base which may be due to small effusion with atelectasis versus infection. Mild prominence of the right perihilar markings. Cardiomediastinal silhouette is within normal. Mild degenerative change of the spine. IMPRESSION: Left base opacification which may be due to small effusion with atelectasis versus infection. Electronically  Signed   By: Marin Olp M.D.   On: 10/13/2018 14:53   Dg Knee Complete 4 Views Left  Result Date: 10/13/2018 CLINICAL DATA:  Knee EXAM: LEFT KNEE - COMPLETE 4+ VIEW COMPARISON:  None. FINDINGS: No fracture or malalignment. Minimal degenerative of the medial joint space. Trace knee effusion. IMPRESSION: No acute osseous abnormality.  Trace knee effusion Electronically Signed   By: Donavan Foil M.D.   On: 10/13/2018 14:58   Dg Foot Complete Left  Addendum Date: 10/13/2018   ADDENDUM REPORT: 10/13/2018 17:02 ADDENDUM: Request made to common specifically on third digit. Evaluation of third digit limited due to flexed positioning and bony overlap. Questionable lucency at the base of the third distal phalanx on one view which may represent subtle fracture versus small erosion. Electronically Signed   By: Donavan Foil M.D.   On: 10/13/2018 17:02   Result Date: 10/13/2018 CLINICAL DATA:  Foot pain EXAM: LEFT FOOT - COMPLETE 3+ VIEW COMPARISON:  11/28/2017 FINDINGS: No fracture or malalignment. Mild degenerative change at the first MTP joint. Small plantar calcaneal spur. IMPRESSION: No acute osseous abnormality Electronically Signed: By: Donavan Foil M.D. On: 10/13/2018 14:59   US Abdomen Limited Ruq  Result Date: 10/13/2018 CLINICAL DATA:  Elevated liver function test.  Sepsis. EXAM: ULTRASOUND ABDOMEN LIMITED RIGHT UPPER QUADRANT COMPARISON:  None. FINDINGS: Gallbladder: Contracted gallbladder with borderline wall thickening of 3.4 mm. No gallstones  or sludge seen. No sonographic Murphy sign noted by sonographer. Common bile duct: Diameter: 4.6 mm Liver: No focal lesion identified. Within normal limits in parenchymal echogenicity. Portal vein is patent on color Doppler imaging with normal direction of blood flow towards the liver. IMPRESSION: Contracted gallbladder with borderline wall thickening, which may be artifactual. Negative sonographic Murphy's sign. Normal appearance of the liver.  Electronically Signed   By: Fidela Salisbury M.D.   On: 10/13/2018 16:29   Impression/ recommendation  53 year old female with history of substance use presents with left knee pain, left toe pain and purplish discoloration and also lethargy.  Also has been having back pain especially neck pain for which she had 2 prior ED visits on 6/10 and 10/04/2018.  Staph aureus bacteremia.  In a patient with cocaine use  ( She denies IVDA) she likely has widespread staph infection.  The left knee is septic , so is  the left toe.  Concern for endocarditis.  Septic emboli to the toe.  2D echo done and may need TEE .  Also concern for cervical spine infection so will need MRI of cervical and thoracic  spine  Plan for left knee wash out tomorrow ?  Abnormal LFTs-HIV and hepatitis panel negative  AST more than ALT. Also high alkaline phosphotase -ultrasound GB no stone   I Discussed the management with care team

## 2018-10-15 NOTE — Consult Note (Signed)
[facility] Consult   Reason for Consult: Drug abuse Referring Physician:  Dr.Vachchani Patient Identification: [name] MRN:  [id number] Principal Diagnosis: <principal problem not specified> Diagnosis:  Active Problems:   Sepsis ([place])   Total Time spent with patient: 30 minutes  Subjective:   [name] is a 53 y.o. female patient admitted with altered mental status and significant pain all over.  HPI: Patient is a 53 year old female who was admitted with altered mental status and with significant significant pain on her left knee and left toe which was swollen and red.  Patient does have a history of abusing cocaine and marijuana for which this consult was requested.  Today patient denies that she used any substances recently and stated that this was in the past.  However her urine drug screen was positive for marijuana and crack cocaine when she was admitted on 21 June. Patient denied having any depression currently.  States that she has a lot of anxiety and sees Dr. Lonia Skinner who prescribes her benzodiazepines.  States that she has been taking the diazepam and Xanax for a long time for her anxiety. She denies any suicidal thoughts or homicidal thoughts.  She is perceptually intact.  Denies any hallucinations.  Past Psychiatric History:  significant drug abuse.  Patient denies any inpatient hospitalizations or suicide attempts.  Risk to Self:  None from suicidal thoughts Risk to Others:  None Prior Inpatient Therapy:  None Prior Outpatient Therapy:  yes  Past Medical History:  Past Medical History:  Diagnosis Date  . Anxiety   . Arthritis    PSORIATIC ARTHRITIS  . Back pain   . Colon polyp   . Depression   . Headache    MIGRAINE  . Ulcer     Past Surgical History:  Procedure Laterality Date  . BREAST REDUCTION SURGERY    . COLONOSCOPY WITH ESOPHAGOGASTRODUODENOSCOPY (EGD)    . COLONOSCOPY WITH PROPOFOL N/A 05/19/2016   Procedure: COLONOSCOPY WITH  PROPOFOL;  Surgeon: Lollie Sails, MD;  Location: [facility] ENDOSCOPY;  Service: Endoscopy;  Laterality: N/A;  . COLONOSCOPY WITH PROPOFOL N/A 03/06/2018   Procedure: COLONOSCOPY WITH PROPOFOL;  Surgeon: Toledo, Benay Pike, MD;  Location: [facility] ENDOSCOPY;  Service: Gastroenterology;  Laterality: N/A;  . ESOPHAGOGASTRODUODENOSCOPY (EGD) WITH PROPOFOL N/A 03/06/2018   Procedure: ESOPHAGOGASTRODUODENOSCOPY (EGD) WITH PROPOFOL;  Surgeon: Toledo, Benay Pike, MD;  Location: [facility] ENDOSCOPY;  Service: Gastroenterology;  Laterality: N/A;  . foot spurs    . FRACTURE SURGERY    . MANDIBLE FRACTURE SURGERY    . TUBAL LIGATION     Family History:  Family History  Problem Relation Age of Onset  . Liver cancer Mother   . Liver disease Mother   . Cirrhosis Mother   . Alcohol abuse Mother   . Cancer Father   . Colon cancer Maternal Aunt    Family Psychiatric  History: denies Social History:  Social History   Substance and Sexual Activity  Alcohol Use Never  . Frequency: Never     Social History   Substance and Sexual Activity  Drug Use Yes  . Types: "Crack" cocaine, Marijuana    Social History   Socioeconomic History  . Marital status: Single    Spouse name: Not on file  . Number of children: Not on file  . Years of education: Not on file  . Highest education level: Not on file  Occupational History  . Not on file  Social Needs  . Financial resource strain: Not  on file  . Food insecurity    Worry: Not on file    Inability: Not on file  . Transportation needs    Medical: Not on file    Non-medical: Not on file  Tobacco Use  . Smoking status: Former Smoker    Types: Cigarettes  . Smokeless tobacco: Never Used  Substance and Sexual Activity  . Alcohol use: Never    Frequency: Never  . Drug use: Yes    Types: "Crack" cocaine, Marijuana  . Sexual activity: Not on file  Lifestyle  . Physical activity    Days per week: Not on file    Minutes per session: Not on file  . Stress:  Not on file  Relationships  . Social Herbalist on phone: Not on file    Gets together: Not on file    Attends religious service: Not on file    Active member of club or organization: Not on file    Attends meetings of clubs or organizations: Not on file    Relationship status: Not on file  Other Topics Concern  . Not on file  Social History Narrative  . Not on file   Additional Social History:    Allergies:   Allergies  Allergen Reactions  . Topiramate Hives    Labs:  Results for orders placed or performed during the hospital encounter of 10/13/18 (from the past 48 hour(s))  Lactic acid, plasma     Status: None   Collection Time: 10/13/18  5:50 PM  Result Value Ref Range   Lactic Acid, Venous 1.2 0.5 - 1.9 mmol/L    Comment: Performed at [facility], [address] [place], [place] [address]  HIV antibody (Routine Testing)     Status: None   Collection Time: 10/14/18  4:16 AM  Result Value Ref Range   HIV Screen 4th Generation wRfx Non Reactive Non Reactive    Comment: (NOTE) Performed At: [facility] [place], [place] [id number] Rush Farmer MD [id number]   Basic metabolic panel     Status: Abnormal   Collection Time: 10/14/18  4:16 AM  Result Value Ref Range   Sodium 134 (L) 135 - 145 mmol/L   Potassium 2.9 (L) 3.5 - 5.1 mmol/L   Chloride 93 (L) 98 - 111 mmol/L   CO2 29 22 - 32 mmol/L   Glucose, Bld 128 (H) 70 - 99 mg/dL   BUN 25 (H) 6 - 20 mg/dL   Creatinine, Ser 0.83 0.44 - 1.00 mg/dL   Calcium 7.3 (L) 8.9 - 10.3 mg/dL   GFR calc non Af Amer >60 >60 mL/min   GFR calc Af Amer >60 >60 mL/min   Anion gap 12 5 - 15    Comment: Performed at [facility], [address] [place], [place] [address]  CBC     Status: Abnormal   Collection Time: 10/14/18  4:16 AM  Result Value Ref Range   WBC 18.9 (H) 4.0 - 10.5 K/uL   RBC 3.23 (L) 3.87 - 5.11 MIL/uL   Hemoglobin 9.9 (L) 12.0 - 15.0 g/dL   HCT 29.9 (L)  36.0 - 46.0 %   MCV 92.6 80.0 - 100.0 fL   MCH 30.7 26.0 - 34.0 pg   MCHC 33.1 30.0 - 36.0 g/dL   RDW 14.6 11.5 - 15.5 %   Platelets 391 150 - 400 K/uL   nRBC 0.0 0.0 - 0.2 %    Comment: Performed  at [facility], [address] [place], [place] [address]  Hepatic function panel     Status: Abnormal   Collection Time: 10/14/18  4:16 AM  Result Value Ref Range   Total Protein 5.4 (L) 6.5 - 8.1 g/dL   Albumin 1.9 (L) 3.5 - 5.0 g/dL   AST 59 (H) 15 - 41 U/L   ALT 42 0 - 44 U/L   Alkaline Phosphatase 584 (H) 38 - 126 U/L   Total Bilirubin 2.9 (H) 0.3 - 1.2 mg/dL   Bilirubin, Direct 2.1 (H) 0.0 - 0.2 mg/dL   Indirect Bilirubin 0.8 0.3 - 0.9 mg/dL    Comment: Performed at [facility], [address] [place], [place] [address]  HIV Antibody (routine testing w rflx)     Status: None   Collection Time: 10/14/18  9:00 AM  Result Value Ref Range   HIV Screen 4th Generation wRfx Non Reactive Non Reactive    Comment: (NOTE) Performed At: [facility] [place], [place] [id number] Rush Farmer MD [id number]   Hepatitis panel, acute     Status: None   Collection Time: 10/14/18  9:00 AM  Result Value Ref Range   Hepatitis B Surface Ag Negative Negative   HCV Ab <0.1 0.0 - 0.9 s/co ratio    Comment: (NOTE)                                  Negative:     < 0.8                             Indeterminate: 0.8 - 0.9                                  Positive:     > 0.9 The CDC recommends that a positive HCV antibody result be followed up with a HCV Nucleic Acid Amplification test (683419). Performed At: [facility] [place], [place] [id number] Rush Farmer MD [id number]    Hep A IgM Negative Negative   Hep B C IgM Negative Negative  Sedimentation rate     Status: Abnormal   Collection Time: 10/14/18  9:00 AM  Result Value Ref Range   Sed Rate 56 (H) 0 - 30 mm/hr    Comment: Performed at [facility], [address] [place], [place] [address]  Aerobic/Anaerobic Culture (surgical/deep wound)     Status: None (Preliminary result)   Collection Time: 10/14/18  1:25 PM   Specimen: Foot; Abscess  Result Value Ref Range   Specimen Description      FOOT LEFT Performed at [facility], [address] [place], [place] [address]    Special Requests      NONE Performed at [facility], [place], [place] [address]    Gram Stain      RARE WBC PRESENT, PREDOMINANTLY PMN FEW GRAM POSITIVE COCCI    Culture      MODERATE STAPHYLOCOCCUS AUREUS SUSCEPTIBILITIES TO FOLLOW Performed at [facility], [place] [address] [place], [place] [address]    Report Status PENDING   CBC     Status: Abnormal   Collection Time: 10/15/18  9:06 AM  Result Value Ref Range   WBC 22.1 (H) 4.0 -  10.5 K/uL   RBC 3.15 (L) 3.87 - 5.11 MIL/uL   Hemoglobin 9.8 (L) 12.0 - 15.0 g/dL   HCT 29.4 (L) 36.0 - 46.0 %   MCV 93.3 80.0 - 100.0 fL   MCH 31.1 26.0 - 34.0 pg   MCHC 33.3 30.0 - 36.0 g/dL   RDW 14.8 11.5 - 15.5 %   Platelets 504 (H) 150 - 400 K/uL   nRBC 0.0 0.0 - 0.2 %    Comment: Performed at [facility], [address] [place], [place] [address]  Comprehensive metabolic panel     Status: Abnormal   Collection Time: 10/15/18  9:06 AM  Result Value Ref Range   Sodium 131 (L) 135 - 145 mmol/L   Potassium 3.9 3.5 - 5.1 mmol/L   Chloride 98 98 - 111 mmol/L   CO2 25 22 - 32 mmol/L   Glucose, Bld 108 (H) 70 - 99 mg/dL   BUN 15 6 - 20 mg/dL   Creatinine, Ser 0.63 0.44 - 1.00 mg/dL   Calcium 7.5 (L) 8.9 - 10.3 mg/dL   Total Protein 5.3 (L) 6.5 - 8.1 g/dL   Albumin 1.8 (L) 3.5 - 5.0 g/dL   AST 23 15 - 41 U/L   ALT 25 0 - 44 U/L   Alkaline Phosphatase 458 (H) 38 - 126 U/L   Total Bilirubin 1.6 (H) 0.3 - 1.2 mg/dL   GFR calc non Af Amer >60 >60 mL/min   GFR calc Af Amer >60 >60 mL/min   Anion gap 8 5 - 15    Comment: Performed at [facility], [address] [place], [place] [address]    Current Facility-Administered Medications  Medication Dose Route Frequency Provider Last Rate Last Dose  . 0.9 % NaCl with KCl 20 mEq/ L  infusion   Intravenous Continuous Loletha Grayer, MD 75 mL/hr at 10/15/18 1318    . acetaminophen (TYLENOL) tablet 650 mg  650 mg Oral Q6H PRN Loletha Grayer, MD   650 mg at 10/14/18 0329   Or  . acetaminophen (TYLENOL) suppository 650 mg  650 mg Rectal Q6H PRN Loletha Grayer, MD      . ALPRAZolam Duanne Moron) tablet 0.25 mg  0.25 mg Oral TID PRN Loletha Grayer, MD   0.25 mg at 10/15/18 1320  . ceFAZolin (ANCEF) IVPB 2g/100 mL premix  2 g Intravenous Q8H Vaughan Basta, MD 200 mL/hr at 10/15/18 1319 2 g at 10/15/18 1319  . diazepam (VALIUM) tablet 2 mg  2 mg Oral BID Vaughan Basta, MD   2 mg at 10/15/18 0830  . escitalopram (LEXAPRO) tablet 30 mg  30 mg Oral Daily Loletha Grayer, MD   30 mg at 10/15/18 0830  . fluticasone (FLONASE) 50 MCG/ACT nasal spray 1 spray  1 spray Each Nare Daily Loletha Grayer, MD   1 spray at 10/15/18 1320  . ketorolac (TORADOL) 15 MG/ML injection 15 mg  15 mg Intravenous Q8H Vaughan Basta, MD   15 mg at 10/15/18 1313  . LORazepam (ATIVAN) injection 2 mg  2 mg Intravenous Once Vaughan Basta, MD      . morphine 2 MG/ML injection 2 mg  2 mg Intravenous Q4H PRN Vaughan Basta, MD   2 mg at 10/15/18 1001  . ondansetron (ZOFRAN) tablet 4 mg  4 mg Oral Q6H PRN Wieting, Richard, MD       Or  . ondansetron (ZOFRAN) injection 4 mg  4 mg Intravenous Q6H PRN Loletha Grayer, MD      .  oxyCODONE (Oxy IR/ROXICODONE) immediate release tablet 5 mg  5 mg Oral Q4H PRN Vaughan Basta, MD   5 mg at 10/15/18 0830  . pantoprazole (PROTONIX) EC tablet 40 mg  40 mg Oral BID Vaughan Basta, MD   40 mg at 10/15/18 1320  . ziprasidone (GEODON) injection 10 mg  10 mg Intramuscular Q6H PRN Mansy, Jan A, MD   10 mg at 10/15/18 0224     Musculoskeletal: Strength & Muscle Tone: flaccid Gait & Station: Unable to assess as patient is in bed Patient leans: Right  Psychiatric Specialty Exam: Physical Exam  ROS  Blood pressure 128/75, pulse 75, temperature 97.7 F (36.5 C), temperature source Oral, resp. rate 18, height 5\' 4"  (1.626 m), weight 80.7 kg, last menstrual period 01/22/2017, SpO2 98 %.Body mass index is 30.55 kg/m.  General Appearance: Disheveled  Eye Contact:  Fair  Speech:  Slow  Volume:  Decreased  Mood:  Dysphoric  Affect:  Constricted  Thought Process:  Coherent  Orientation:  Full (Time, Place, and Person)  Thought Content:  Logical  Suicidal Thoughts:  No  Homicidal Thoughts:  No  Memory:  Immediate;   Fair Recent;   Fair Remote;   Fair  Judgement:  Fair  Insight:  Shallow  Psychomotor Activity:  Decreased  Concentration:  Concentration: Fair and Attention Span: Fair  Recall:  [employer] of Knowledge:  Fair  Language:  Fair  Akathisia:  No  Handed:  Right  AIMS (if indicated):     Assets:  Communication Skills  ADL's:  limited  Cognition:  WNL  Sleep:   fair     Treatment Plan Summary: Patient is a 53 year old woman with a history of crack cocaine and marijuana abuse and anxiety who denies this being a problem.  She states that this has been a problem in the past and currently she is doing fine.  Declines any rehab services.  Disposition: No evidence of imminent risk to self or others at present.   Patient does not meet criteria for psychiatric inpatient admission.  Recommend that social work on the unit talk to patient and give her  resources in case she changes her mind about rehab options for abuse of drugs.  Psychiatry will sign off on this consult and please contact us again as needed.  Elvin So, MD 10/15/2018 3:57 PM

## 2018-10-15 NOTE — Progress Notes (Signed)
[facility]                                                      Patient Demographics  [name], is a 53 y.o. female   MRN: [id number]   DOB - [birth year]  Admit Date - 10/13/2018    Outpatient Primary MD for the patient is Marguerita Merles, MD  Consult requested in the Hospital by Vaughan Basta, *, On 10/15/2018  With History of -  Past Medical History:  Diagnosis Date  . Anxiety   . Arthritis    PSORIATIC ARTHRITIS  . Back pain   . Colon polyp   . Depression   . Headache    MIGRAINE  . Ulcer       Past Surgical History:  Procedure Laterality Date  . BREAST REDUCTION SURGERY    . COLONOSCOPY WITH ESOPHAGOGASTRODUODENOSCOPY (EGD)    . COLONOSCOPY WITH PROPOFOL N/A 05/19/2016   Procedure: COLONOSCOPY WITH PROPOFOL;  Surgeon: Lollie Sails, MD;  Location: [facility] ENDOSCOPY;  Service: Endoscopy;  Laterality: N/A;  . COLONOSCOPY WITH PROPOFOL N/A 03/06/2018   Procedure: COLONOSCOPY WITH PROPOFOL;  Surgeon: Toledo, Benay Pike, MD;  Location: [facility] ENDOSCOPY;  Service: Gastroenterology;  Laterality: N/A;  . ESOPHAGOGASTRODUODENOSCOPY (EGD) WITH PROPOFOL N/A 03/06/2018   Procedure: ESOPHAGOGASTRODUODENOSCOPY (EGD) WITH PROPOFOL;  Surgeon: Toledo, Benay Pike, MD;  Location: [facility] ENDOSCOPY;  Service: Gastroenterology;  Laterality: N/A;  . foot spurs    . FRACTURE SURGERY    . MANDIBLE FRACTURE SURGERY    . TUBAL LIGATION      in for   Chief Complaint  Patient presents with  . Altered Mental Status     HPI  [name]  is a 53 y.o. female, hospitalized couple days ago with knee effusion and infected left third toe I&D the infected toe yesterday at bedside drained out some pus sent for culture.  Started wet-to-dry saline dressings.  Nurse informed today the dressing is come off.    Social History Social History   Tobacco Use  .  Smoking status: Former Smoker    Types: Cigarettes  . Smokeless tobacco: Never Used  Substance Use Topics  . Alcohol use: Never    Frequency: Never    Family History Family History  Problem Relation Age of Onset  . Liver cancer Mother   . Liver disease Mother   . Cirrhosis Mother   . Alcohol abuse Mother   . Cancer Father   . Colon cancer Maternal Aunt     Prior to Admission medications   Medication Sig Start Date End Date Taking? Authorizing Provider  ALPRAZolam Duanne Moron) 1 MG tablet Take 1 mg by mouth 4 (four) times daily.   Yes [provider]  amphetamine-dextroamphetamine (ADDERALL) 30 MG tablet Take 30 mg by mouth 2 (two) times daily.    Yes [provider]  buPROPion (WELLBUTRIN SR) 200 MG 12 hr tablet TK 1 T PO  QAM AND QD AT NOON 08/13/18  Yes [provider]  clotrimazole-betamethasone (LOTRISONE) cream APP TO AFFECTED EAR ONCE OR TWICE WEEKLY PRN FOR SKIN IRRITATION. 11/09/16  Yes [provider]  diazepam (VALIUM) 5 MG tablet Take 1 tablet (5 mg total) by mouth every 8 (eight) hours as needed for anxiety. 10/04/18  Yes Paulette Blanch, MD  escitalopram (LEXAPRO) 20  MG tablet Take 20 mg by mouth daily.    Yes [provider]  FLUoxetine (PROZAC) 20 MG capsule TK 1 C PO QD 08/13/18  Yes [provider]  guanFACINE (TENEX) 2 MG tablet Take 2 mg by mouth daily. 09/08/18  Yes [provider]  ibuprofen (ADVIL) 800 MG tablet Take 1 tablet (800 mg total) by mouth every 8 (eight) hours as needed for moderate pain. 10/04/18  Yes Paulette Blanch, MD  meloxicam (MOBIC) 15 MG tablet Take 15 mg by mouth daily.   Yes [provider]  methocarbamol (ROBAXIN) 500 MG tablet Take 500 mg by mouth every 8 (eight) hours as needed for muscle spasms.   Yes [provider]  oxyCODONE-acetaminophen (PERCOCET/ROXICET) 5-325 MG tablet Take 1 tablet by mouth every 4 (four) hours as needed for severe pain. 10/04/18  Yes Paulette Blanch, MD     Anti-infectives (From admission, onward)   Start     Dose/Rate Route Frequency Ordered Stop   10/14/18 1900  ceFAZolin (ANCEF) IVPB 2g/100 mL premix     2 g 200 mL/hr over 30 Minutes Intravenous Every 8 hours 10/14/18 1601     10/14/18 1800  vancomycin (VANCOCIN) IVPB 1000 mg/200 mL premix  Status:  Discontinued     1,000 mg 200 mL/hr over 60 Minutes Intravenous Every 24 hours 10/13/18 1645 10/14/18 0804   10/14/18 0900  ceFAZolin (ANCEF) IVPB 2g/100 mL premix  Status:  Discontinued     2 g 200 mL/hr over 30 Minutes Intravenous Every 8 hours 10/14/18 0804 10/14/18 1601   10/13/18 2200  ceFEPIme (MAXIPIME) 2 g in sodium chloride 0.9 % 100 mL IVPB  Status:  Discontinued     2 g 200 mL/hr over 30 Minutes Intravenous Every 8 hours 10/13/18 1645 10/14/18 0804   10/13/18 1530  vancomycin (VANCOCIN) 1,500 mg in sodium chloride 0.9 % 500 mL IVPB     1,500 mg 250 mL/hr over 120 Minutes Intravenous  Once 10/13/18 1521 10/13/18 1949   10/13/18 1515  ceFEPIme (MAXIPIME) 2 g in sodium chloride 0.9 % 100 mL IVPB     2 g 200 mL/hr over 30 Minutes Intravenous  Once 10/13/18 1513 10/13/18 1617   10/13/18 1515  metroNIDAZOLE (FLAGYL) IVPB 500 mg     500 mg 100 mL/hr over 60 Minutes Intravenous  Once 10/13/18 1513 10/13/18 1724   10/13/18 1515  vancomycin (VANCOCIN) IVPB 1000 mg/200 mL premix  Status:  Discontinued     1,000 mg 200 mL/hr over 60 Minutes Intravenous  Once 10/13/18 1513 10/13/18 1521      Scheduled Meds: . diazepam  1 mg Oral BID  . escitalopram  30 mg Oral Daily  . fluticasone  1 spray Each Nare Daily  . ketorolac  15 mg Intravenous Q8H  . LORazepam  2 mg Intravenous Once  . pantoprazole  40 mg Oral BID   Continuous Infusions: . 0.9 % NaCl with KCl 20 mEq / L 75 mL/hr at 10/15/18 1318  .  ceFAZolin (ANCEF) IV 2 g (10/15/18 1319)   PRN Meds:.acetaminophen **OR** acetaminophen, ALPRAZolam, morphine injection, ondansetron **OR** ondansetron (ZOFRAN) IV, oxyCODONE,  ziprasidone  Allergies  Allergen Reactions  . Topiramate Hives    Physical Exam  Vitals  Blood pressure 128/75, pulse 75, temperature 97.7 F (36.5 C), temperature source Oral, resp. rate 18, height 5\' 4"  (1.626 m), weight 80.7 kg, last menstrual period 01/22/2017, SpO2 98 %.  Lower Extremity exam:  Vascular: Palpable bilateral DP  and PT pulses  Dermatological: Wound on left third toe looks much better.  Cellulitis is improved and is basically gone straight just that the distal 2 phalanges of the third toe.  Concentrated over the DIP joint.  There is an ulceration there but it looks more stable today.  Neurological: Intact peripheral nerve function  Ortho: Mild contracture to some of the toes.  Data Review  CBC Recent Labs  Lab 10/13/18 1419 10/14/18 0416 10/15/18 0906  WBC 22.0* 18.9* 22.1*  HGB 10.4* 9.9* 9.8*  HCT 30.2* 29.9* 29.4*  PLT 423* 391 504*  MCV 90.1 92.6 93.3  MCH 31.0 30.7 31.1  MCHC 34.4 33.1 33.3  RDW 14.1 14.6 14.8   ------------------------------------------------------------------------------------------------------------------  Chemistries  Recent Labs  Lab 10/13/18 1419 10/14/18 0416 10/15/18 0906  NA 127* 134* 131*  K 2.8* 2.9* 3.9  CL 83* 93* 98  CO2 31 29 25   GLUCOSE 159* 128* 108*  BUN 28* 25* 15  CREATININE 1.01* 0.83 0.63  CALCIUM 7.8* 7.3* 7.5*  MG 2.6*  --   --   AST 88* 59* 23  ALT 47* 42 25  ALKPHOS 759* 584* 458*  BILITOT 3.8* 2.9* 1.6*   ----------------------------------------------------------------------------------Assessment & Plan: Redressed the toe today with wet-to-dry saline dressing I think it was long as we continue to do that and she is on antibiotics she will continue to get some improvement with it.  Not sure what is going on with her knee but Dr. Exie Parody saw her for that today and recommended watching at this point may need an irrigation at some point in the future.  I will continue wet-to-dry dressings and  follow her while she is in the hospital.  Active Problems:   Sepsis [facility])   Family Communication: Plan discussed with patient   Albertine Patricia M.D on 10/15/2018 at 6:01 PM  Thank you for the consult, we will follow the patient with you in the Hospital.

## 2018-10-15 NOTE — Progress Notes (Signed)
Patient is agitated and anxious. She is screaming out to the top of her lungs, she keep hitting the call bell no matter if you just left the room, she has called the operator twice, she is throwing her pillows and blankets, she is throwing her cup with drink in it, keeps taking her O2 off, she keeps flipping herself upside down in the bed. I have gave her scheduled as well as all of her PRN medications. I paged the provider and have new orders for a 1:1 sitter and another PRN medication.

## 2018-10-16 ENCOUNTER — Inpatient Hospital Stay: Payer: Medicaid Other

## 2018-10-16 ENCOUNTER — Encounter
Admission: EM | Disposition: A | Discharge status: Home / Self Care | Payer: Self-pay | Source: Home / Self Care | Attending: Internal Medicine

## 2018-10-16 ENCOUNTER — Inpatient Hospital Stay: Payer: Medicaid Other | Admitting: Anesthesiology

## 2018-10-16 ENCOUNTER — Encounter: Payer: Self-pay | Admitting: Emergency Medicine

## 2018-10-16 DIAGNOSIS — R748 Abnormal levels of other serum enzymes: Secondary | ICD-10-CM

## 2018-10-16 DIAGNOSIS — G061 Intraspinal abscess and granuloma: Secondary | ICD-10-CM

## 2018-10-16 HISTORY — PX: IRRIGATION AND DEBRIDEMENT KNEE: SHX5185

## 2018-10-16 LAB — CBC
HCT: 25.5 % — ABNORMAL LOW (ref 36.0–46.0)
Hemoglobin: 8.5 g/dL — ABNORMAL LOW (ref 12.0–15.0)
MCH: 31 pg (ref 26.0–34.0)
MCHC: 33.3 g/dL (ref 30.0–36.0)
MCV: 93.1 fL (ref 80.0–100.0)
Platelets: 501 10*3/uL — ABNORMAL HIGH (ref 150–400)
RBC: 2.74 MIL/uL — ABNORMAL LOW (ref 3.87–5.11)
RDW: 14.8 % (ref 11.5–15.5)
WBC: 24.3 10*3/uL — ABNORMAL HIGH (ref 4.0–10.5)
nRBC: 0 % (ref 0.0–0.2)

## 2018-10-16 LAB — COMPREHENSIVE METABOLIC PANEL
ALT: 18 U/L (ref 0–44)
AST: 19 U/L (ref 15–41)
Albumin: 1.7 g/dL — ABNORMAL LOW (ref 3.5–5.0)
Alkaline Phosphatase: 436 U/L — ABNORMAL HIGH (ref 38–126)
Anion gap: 8 (ref 5–15)
BUN: 11 mg/dL (ref 6–20)
CO2: 25 mmol/L (ref 22–32)
Calcium: 7.4 mg/dL — ABNORMAL LOW (ref 8.9–10.3)
Chloride: 97 mmol/L — ABNORMAL LOW (ref 98–111)
Creatinine, Ser: 0.57 mg/dL (ref 0.44–1.00)
GFR calc Af Amer: >60 mL/min (ref >60)
GFR calc non Af Amer: >60 mL/min (ref >60)
Glucose, Bld: 106 mg/dL — ABNORMAL HIGH (ref 70–99)
Potassium: 3.7 mmol/L (ref 3.5–5.1)
Sodium: 130 mmol/L — ABNORMAL LOW (ref 135–145)
Total Bilirubin: 1.5 mg/dL — ABNORMAL HIGH (ref 0.3–1.2)
Total Protein: 5.2 g/dL — ABNORMAL LOW (ref 6.5–8.1)

## 2018-10-16 LAB — URINE DRUG SCREEN, QUALITATIVE (ARMC ONLY)
Amphetamines, Ur Screen: NOT DETECTED
Barbiturates, Ur Screen: NOT DETECTED
Benzodiazepine, Ur Scrn: POSITIVE — AB
Cannabinoid 50 Ng, Ur ~~LOC~~: POSITIVE — AB
Cocaine Metabolite,Ur ~~LOC~~: POSITIVE — AB
MDMA (Ecstasy)Ur Screen: NOT DETECTED
Methadone Scn, Ur: NOT DETECTED
Opiate, Ur Screen: POSITIVE — AB
Phencyclidine (PCP) Ur S: NOT DETECTED
Tricyclic, Ur Screen: POSITIVE — AB

## 2018-10-16 LAB — PROTIME-INR
INR: 1.3 — ABNORMAL HIGH (ref 0.8–1.2)
Prothrombin Time: 16.4 seconds — ABNORMAL HIGH (ref 11.4–15.2)

## 2018-10-16 LAB — PREGNANCY, URINE: Preg Test, Ur: NEGATIVE

## 2018-10-16 LAB — BODY FLUID CULTURE

## 2018-10-16 SURGERY — IRRIGATION AND DEBRIDEMENT KNEE
Anesthesia: General | Site: Knee | Laterality: Left

## 2018-10-16 MED ORDER — MEPERIDINE HCL 50 MG/ML IJ SOLN: 6.2500 mg | INTRAMUSCULAR | Status: DC | PRN

## 2018-10-16 MED ORDER — DOCUSATE SODIUM 100 MG PO CAPS
100.0000 mg | ORAL_CAPSULE | Freq: Two times a day (BID) | ORAL | Status: DC
  Administered 2018-10-16 – 2018-11-10 (×25): 100 mg via ORAL
  Filled 2018-10-16 (×45): qty 1

## 2018-10-16 MED ORDER — BACITRACIN 50000 UNITS IM SOLR
INTRAMUSCULAR | Status: AC
  Filled 2018-10-16: qty 1

## 2018-10-16 MED ORDER — SODIUM CHLORIDE FLUSH 0.9 % IV SOLN
INTRAVENOUS | Status: AC
  Filled 2018-10-16: qty 50

## 2018-10-16 MED ORDER — GADOBUTROL 1 MMOL/ML IV SOLN: 8.0000 mL | Freq: Once | INTRAVENOUS | Status: DC | PRN

## 2018-10-16 MED ORDER — METOCLOPRAMIDE HCL 5 MG/ML IJ SOLN
5.0000 mg | Freq: Three times a day (TID) | INTRAMUSCULAR | Status: DC | PRN
  Administered 2018-10-18: 5 mg via INTRAVENOUS
  Filled 2018-10-16: qty 2

## 2018-10-16 MED ORDER — FENTANYL CITRATE (PF) 100 MCG/2ML IJ SOLN
INTRAMUSCULAR | Status: DC | PRN
  Administered 2018-10-16 (×6): 50 ug via INTRAVENOUS

## 2018-10-16 MED ORDER — LACTATED RINGERS IV SOLN: INTRAVENOUS | Status: DC

## 2018-10-16 MED ORDER — LIDOCAINE HCL (CARDIAC) PF 100 MG/5ML IV SOSY
PREFILLED_SYRINGE | INTRAVENOUS | Status: DC | PRN
  Administered 2018-10-16: 80 mg via INTRAVENOUS

## 2018-10-16 MED ORDER — METOCLOPRAMIDE HCL 5 MG PO TABS: 5.0000 mg | ORAL_TABLET | Freq: Three times a day (TID) | ORAL | Status: DC | PRN

## 2018-10-16 MED ORDER — BUPIVACAINE-EPINEPHRINE (PF) 0.25% -1:200000 IJ SOLN
INTRAMUSCULAR | Status: DC | PRN
  Administered 2018-10-16: 8 mL

## 2018-10-16 MED ORDER — NAFCILLIN SODIUM 2 G IJ SOLR: 2.0000 g | INTRAMUSCULAR | Status: DC

## 2018-10-16 MED ORDER — OXYCODONE HCL 5 MG/5ML PO SOLN: 5.0000 mg | Freq: Once | ORAL | Status: DC | PRN

## 2018-10-16 MED ORDER — LACTATED RINGERS IV SOLN
INTRAVENOUS | Status: DC
  Administered 2018-10-16 (×2): via INTRAVENOUS

## 2018-10-16 MED ORDER — ONDANSETRON HCL 4 MG/2ML IJ SOLN
4.0000 mg | Freq: Four times a day (QID) | INTRAMUSCULAR | Status: DC | PRN
  Administered 2018-10-18 – 2018-10-29 (×2): 4 mg via INTRAVENOUS
  Filled 2018-10-16 (×2): qty 2

## 2018-10-16 MED ORDER — LACTATED RINGERS IV SOLN
INTRAVENOUS | Status: DC | PRN
  Administered 2018-10-16: 2 mL

## 2018-10-16 MED ORDER — PROPOFOL 10 MG/ML IV BOLUS
INTRAVENOUS | Status: DC | PRN
  Administered 2018-10-16: 180 mg via INTRAVENOUS
  Administered 2018-10-16: 20 mg via INTRAVENOUS

## 2018-10-16 MED ORDER — METHYLPREDNISOLONE ACETATE 40 MG/ML IJ SUSP
INTRAMUSCULAR | Status: AC
  Filled 2018-10-16: qty 1

## 2018-10-16 MED ORDER — PROPOFOL 10 MG/ML IV BOLUS
INTRAVENOUS | Status: AC
  Filled 2018-10-16: qty 20

## 2018-10-16 MED ORDER — GADOBUTROL 1 MMOL/ML IV SOLN
7.5000 mL | Freq: Once | INTRAVENOUS | Status: AC | PRN
  Administered 2018-10-16: 23:00:00 7.5 mL via INTRAVENOUS

## 2018-10-16 MED ORDER — SODIUM CHLORIDE 0.9 % IV SOLN
INTRAVENOUS | Status: DC | PRN
  Administered 2018-10-16: 6000 mL

## 2018-10-16 MED ORDER — FENTANYL CITRATE (PF) 100 MCG/2ML IJ SOLN
INTRAMUSCULAR | Status: AC
  Filled 2018-10-16: qty 2

## 2018-10-16 MED ORDER — ROPIVACAINE HCL 5 MG/ML IJ SOLN
INTRAMUSCULAR | Status: DC | PRN
  Administered 2018-10-16: 20 mL

## 2018-10-16 MED ORDER — BUPIVACAINE-EPINEPHRINE (PF) 0.25% -1:200000 IJ SOLN
INTRAMUSCULAR | Status: AC
  Filled 2018-10-16: qty 30

## 2018-10-16 MED ORDER — ONDANSETRON HCL 4 MG PO TABS
4.0000 mg | ORAL_TABLET | Freq: Four times a day (QID) | ORAL | Status: DC | PRN
  Administered 2018-10-30 – 2018-11-06 (×3): 4 mg via ORAL
  Filled 2018-10-16 (×3): qty 1

## 2018-10-16 MED ORDER — OXYCODONE HCL 5 MG PO TABS: 5.0000 mg | ORAL_TABLET | Freq: Once | ORAL | Status: DC | PRN

## 2018-10-16 MED ORDER — FENTANYL CITRATE (PF) 100 MCG/2ML IJ SOLN: 25.0000 ug | INTRAMUSCULAR | Status: DC | PRN

## 2018-10-16 MED ORDER — DEXAMETHASONE SODIUM PHOSPHATE 10 MG/ML IJ SOLN
INTRAMUSCULAR | Status: DC | PRN
  Administered 2018-10-16: 4 mg via INTRAVENOUS

## 2018-10-16 MED ORDER — MORPHINE SULFATE (PF) 4 MG/ML IV SOLN
INTRAVENOUS | Status: AC
  Filled 2018-10-16: qty 1

## 2018-10-16 MED ORDER — SODIUM CHLORIDE (PF) 0.9 % IJ SOLN
INTRAMUSCULAR | Status: AC
  Filled 2018-10-16: qty 50

## 2018-10-16 MED ORDER — LORAZEPAM 2 MG/ML IJ SOLN: 2.0000 mg | Freq: Once | INTRAMUSCULAR | Status: DC

## 2018-10-16 MED ORDER — ONDANSETRON HCL 4 MG/2ML IJ SOLN
INTRAMUSCULAR | Status: DC | PRN
  Administered 2018-10-16: 4 mg via INTRAVENOUS

## 2018-10-16 MED ORDER — MIDAZOLAM HCL 2 MG/2ML IJ SOLN
INTRAMUSCULAR | Status: AC
  Filled 2018-10-16: qty 2

## 2018-10-16 MED ORDER — ROPIVACAINE HCL 5 MG/ML IJ SOLN
INTRAMUSCULAR | Status: AC
  Filled 2018-10-16: qty 20

## 2018-10-16 MED ORDER — SODIUM CHLORIDE FLUSH 0.9 % IV SOLN
INTRAVENOUS | Status: AC
  Filled 2018-10-16: qty 10

## 2018-10-16 MED ORDER — SODIUM CHLORIDE 0.9 % IV SOLN
2.0000 g | INTRAVENOUS | Status: DC
  Administered 2018-10-16 – 2018-10-17 (×7): 2 g via INTRAVENOUS
  Filled 2018-10-16: qty 2000
  Filled 2018-10-16: qty 2
  Filled 2018-10-16: qty 2000
  Filled 2018-10-16: qty 2
  Filled 2018-10-16: qty 2000
  Filled 2018-10-16 (×4): qty 2
  Filled 2018-10-16 (×2): qty 2000

## 2018-10-16 MED ORDER — BACITRACIN 50000 UNITS IM SOLR
INTRAMUSCULAR | Status: AC
  Filled 2018-10-16: qty 5

## 2018-10-16 MED ORDER — LORAZEPAM 2 MG/ML IJ SOLN
2.0000 mg | INTRAMUSCULAR | Status: AC
  Administered 2018-10-16: 22:00:00 2 mg via INTRAVENOUS
  Filled 2018-10-16: qty 1

## 2018-10-16 MED ORDER — PROMETHAZINE HCL 25 MG/ML IJ SOLN: 6.2500 mg | INTRAMUSCULAR | Status: DC | PRN

## 2018-10-16 SURGICAL SUPPLY — 38 items
ADAPTER IRRIG TUBE 2 SPIKE SOL (ADAPTER) ×4 IMPLANT
ADPR TBG 2 SPK PMP STRL ASCP (ADAPTER) ×2
APL PRP STRL LF DISP 70% ISPRP (MISCELLANEOUS) ×1
BLADE FULL RADIUS 3.5 (BLADE) IMPLANT
BLADE INCISOR PLUS 4.5 (BLADE) ×2 IMPLANT
BLADE SHAVER 4.5 DBL SERAT CV (CUTTER) ×2 IMPLANT
BLADE SURG SZ11 CARB STEEL (BLADE) ×2 IMPLANT
BRUSH SCRUB EZ  4% CHG (MISCELLANEOUS) ×2
BRUSH SCRUB EZ 4% CHG (MISCELLANEOUS) ×2 IMPLANT
CHLORAPREP W/TINT 26 (MISCELLANEOUS) ×2 IMPLANT
COOLER POLAR GLACIER W/PUMP (MISCELLANEOUS) IMPLANT
COVER WAND RF STERILE (DRAPES) ×2 IMPLANT
DRAPE IMP U-DRAPE 54X76 (DRAPES) ×2 IMPLANT
GAUZE SPONGE 4X4 12PLY STRL (GAUZE/BANDAGES/DRESSINGS) ×2 IMPLANT
GAUZE XEROFORM 1X8 LF (GAUZE/BANDAGES/DRESSINGS) ×2 IMPLANT
GLOVE INDICATOR 8.0 STRL GRN (GLOVE) ×2 IMPLANT
GLOVE SURG ORTHO 8.0 STRL STRW (GLOVE) ×2 IMPLANT
GOWN STRL REUS W/ TWL LRG LVL3 (GOWN DISPOSABLE) ×1 IMPLANT
GOWN STRL REUS W/ TWL XL LVL3 (GOWN DISPOSABLE) ×1 IMPLANT
GOWN STRL REUS W/TWL LRG LVL3 (GOWN DISPOSABLE) ×2
GOWN STRL REUS W/TWL XL LVL3 (GOWN DISPOSABLE) ×2
IV LACTATED RINGER IRRG 3000ML (IV SOLUTION) ×4
IV LR IRRIG 3000ML ARTHROMATIC (IV SOLUTION) ×2 IMPLANT
KIT TURNOVER KIT A (KITS) ×2 IMPLANT
MANIFOLD NEPTUNE II (INSTRUMENTS) ×2 IMPLANT
MAT ABSORB  FLUID 56X50 GRAY (MISCELLANEOUS) ×1
MAT ABSORB FLUID 56X50 GRAY (MISCELLANEOUS) ×1 IMPLANT
NDL SPNL 20GX3.5 QUINCKE YW (NEEDLE) ×1 IMPLANT
NEEDLE SPNL 20GX3.5 QUINCKE YW (NEEDLE) ×2 IMPLANT
PACK ARTHROSCOPY KNEE (MISCELLANEOUS) ×2 IMPLANT
PAD ABD DERMACEA PRESS 5X9 (GAUZE/BANDAGES/DRESSINGS) ×4 IMPLANT
PAD WRAPON POLAR KNEE (MISCELLANEOUS) IMPLANT
SUT ETHILON 4-0 (SUTURE) ×2
SUT ETHILON 4-0 FS2 18XMFL BLK (SUTURE) ×1
SUTURE ETHLN 4-0 FS2 18XMF BLK (SUTURE) ×1 IMPLANT
TUBING ARTHRO INFLOW-ONLY STRL (TUBING) ×2 IMPLANT
WAND WEREWOLF FLOW 90D (MISCELLANEOUS) IMPLANT
WRAPON POLAR PAD KNEE (MISCELLANEOUS)

## 2018-10-16 NOTE — Progress Notes (Signed)
Pt to OR via bed.

## 2018-10-16 NOTE — Op Note (Signed)
  10/16/2018  4:05 PM  PATIENT:  [name]    PRE-OPERATIVE DIAGNOSIS:  Left knee septic arthritis  POST-OPERATIVE DIAGNOSIS:  Same  PROCEDURE:  IRRIGATION AND DEBRIDEMENT LEFT KNEE ARTHROSCOPY, PARTIAL SYNOVECTOMY, CHONDROPLASTY  SURGEON:  Lovell Sheehan, MD  ANESTHESIA:   General  PREOPERATIVE INDICATIONS:  [name] is a  53 y.o. female with a diagnosis of Infected Knee who  elected for surgical management of the SEPTIC ARTHRITIS  The risks benefits and alternatives were discussed with the patient preoperatively including but not limited to the risks of infection, bleeding, nerve injury, cardiopulmonary complications, the need for revision surgery, among others, and the patient was willing to proceed.  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 General anesthesia was induced and the left extremity was prepped and draped in standard sterile fashion.  Proposed arthroscopy incisions were drawn out with a surgical marker. These were pre-injected with 0.5% marcaine with epinephrine. An 11 blade was used to establish an inferior lateral and inferomedial portals. The inferomedial portal was created using a 18-gauge spinal needle under direct visualization. An additional portal was made superior/lateral.  A full diagnostic examination of the knee was performed including the suprapatellar pouch, patellofemoral joint, medial lateral compartments as well as the medial lateral gutters, the intercondylar notch in the posterior knee. Please see findings.  A  chondroplasty of the lateralfemoral condyle, trochlea and undersurface of patella was performed using a 4-0 resector shaver blade. A partial synovectomy was also performed using a 4-0 resector shaver blade.  The knee was then copiously lavaged with more than 6 liters of bacitracin laced normal saline. All arthroscopic instruments were removed. The 3 arthroscopy portals were closed with 4-0 nylon. A dry sterile and compressive dressing was applied. The patient was brought to the PACU in stable condition. I was scrubbed and present for the entire case and all sharp and instrument counts were correct at the conclusion the case. I spoke with the patient's family postoperatively to let them know the case was performed without complication and the patient was stable in the recovery room.     Elyn Aquas. Harlow Mares, MD

## 2018-10-16 NOTE — Progress Notes (Signed)
Pt returned to room after surgery. Pt alert and oriented. Dressing to left knee dry and intact. Polar Care in place to left knee. Pt denies pain or discomfort at this time.

## 2018-10-16 NOTE — Progress Notes (Addendum)
Subjective:  Patient reports pain as marked.  Pain all over.  Objective:   VITALS:   Vitals:   10/14/18 1924 10/15/18 0230 10/15/18 0828 10/15/18 1944  BP: (!) 152/90 134/73 128/75 140/84  Pulse: 78 77 75 89  Resp: 18 16 18 16   Temp: 97.8 F (36.6 C) 98.1 F (36.7 C) 97.7 F (36.5 C) 98.9 F (37.2 C)  TempSrc: Oral Oral Oral Oral  SpO2: 97% 97% 98% 95%  Weight:      Height:        PHYSICAL EXAM:  Neurovascular intact Dorsiflexion/Plantar flexion intact No cellulitis present Compartment soft  LABS  Results for orders placed or performed during the hospital encounter of 10/13/18 (from the past 24 hour(s))  CULTURE, BLOOD (ROUTINE X 2) w Reflex to ID Panel     Status: None (Preliminary result)   Collection Time: 10/16/18 12:03 AM   Specimen: BLOOD  Result Value Ref Range   Specimen Description BLOOD RIGHT ASSIST CONTROL    Special Requests      BOTTLES DRAWN AEROBIC AND ANAEROBIC Blood Culture adequate volume   Culture      NO GROWTH < 12 HOURS Performed at [facility], [address] [place], [place] [address]    Report Status PENDING   CULTURE, BLOOD (ROUTINE X 2) w Reflex to ID Panel     Status: None (Preliminary result)   Collection Time: 10/16/18 12:10 AM   Specimen: BLOOD  Result Value Ref Range   Specimen Description BLOOD LEFT ASSIST CONTROL    Special Requests      BOTTLES DRAWN AEROBIC AND ANAEROBIC Blood Culture adequate volume   Culture      NO GROWTH < 12 HOURS Performed at [facility], [address] [place], [place] [id number]    Report Status PENDING   Protime-INR     Status: Abnormal   Collection Time: 10/16/18 12:10 AM  Result Value Ref Range   Prothrombin Time 16.4 (H) 11.4 - 15.2 seconds   INR 1.3 (H) 0.8 - 1.2  Comprehensive metabolic panel     Status: Abnormal   Collection Time: 10/16/18 12:10 AM  Result Value Ref Range   Sodium 130 (L) 135 - 145 mmol/L   Potassium 3.7 3.5 - 5.1 mmol/L   Chloride 97 (L) 98  - 111 mmol/L   CO2 25 22 - 32 mmol/L   Glucose, Bld 106 (H) 70 - 99 mg/dL   BUN 11 6 - 20 mg/dL   Creatinine, Ser 0.57 0.44 - 1.00 mg/dL   Calcium 7.4 (L) 8.9 - 10.3 mg/dL   Total Protein 5.2 (L) 6.5 - 8.1 g/dL   Albumin 1.7 (L) 3.5 - 5.0 g/dL   AST 19 15 - 41 U/L   ALT 18 0 - 44 U/L   Alkaline Phosphatase 436 (H) 38 - 126 U/L   Total Bilirubin 1.5 (H) 0.3 - 1.2 mg/dL   GFR calc non Af Amer >60 >60 mL/min   GFR calc Af Amer >60 >60 mL/min   Anion gap 8 5 - 15  CBC     Status: Abnormal   Collection Time: 10/16/18 12:10 AM  Result Value Ref Range   WBC 24.3 (H) 4.0 - 10.5 K/uL   RBC 2.74 (L) 3.87 - 5.11 MIL/uL   Hemoglobin 8.5 (L) 12.0 - 15.0 g/dL   HCT 25.5 (L) 36.0 - 46.0 %   MCV 93.1 80.0 - 100.0 fL   MCH 31.0 26.0 - 34.0 pg  MCHC 33.3 30.0 - 36.0 g/dL   RDW 14.8 11.5 - 15.5 %   Platelets 501 (H) 150 - 400 K/uL   nRBC 0.0 0.0 - 0.2 %  Urine Drug Screen, Qualitative ([facility] only)     Status: Abnormal   Collection Time: 10/16/18  4:42 AM  Result Value Ref Range   Tricyclic, Ur Screen POSITIVE (A) NONE DETECTED   Amphetamines, Ur Screen NONE DETECTED NONE DETECTED   MDMA (Ecstasy)Ur Screen NONE DETECTED NONE DETECTED   Cocaine Metabolite,Ur [place] POSITIVE (A) NONE DETECTED   Opiate, Ur Screen POSITIVE (A) NONE DETECTED   Phencyclidine (PCP) Ur S NONE DETECTED NONE DETECTED   Cannabinoid 50 Ng, Ur [place] POSITIVE (A) NONE DETECTED   Barbiturates, Ur Screen NONE DETECTED NONE DETECTED   Benzodiazepine, Ur Scrn POSITIVE (A) NONE DETECTED   Methadone Scn, Ur NONE DETECTED NONE DETECTED    Dg Chest 2 View  Result Date: 10/15/2018 CLINICAL DATA:  Initial evaluation for acute hypoxia, sepsis. EXAM: CHEST - 2 VIEW COMPARISON:  Prior radiograph from 10/13/2018. FINDINGS: Cardiac and mediastinal silhouettes are stable in size and contour, and remain within normal limits. Lungs mildly hypoinflated. Persistent small left pleural effusion. Associated left basilar opacity has mildly increased  from previous, most worrisome for progressive infiltrate. Atelectatic changes may be contributory. No other focal airspace disease. No pulmonary edema. No pneumothorax. No acute osseous finding. IMPRESSION: Persistent small left pleural effusion with associated left basilar opacity, mildly worsened from previous, most worrisome for progressive infiltrate. Electronically Signed   By: Jeannine Boga M.D.   On: 10/15/2018 17:50   Mr Cervical Spine Wo Contrast  Result Date: 10/16/2018 CLINICAL DATA:  Launch template MR cervicothoracic EXAM: MRI CERVICAL AND THORACIC SPINE WITHOUT CONTRAST TECHNIQUE: Multiplanar and multiecho pulse sequences of the cervical and thoracic spine were obtained without intravenous contrast. COMPARISON:  None. FINDINGS: Examination is markedly degraded by motion MRI CERVICAL SPINE FINDINGS Alignment: Physiologic. Vertebrae: No fracture, evidence of discitis, or bone lesion. Cord: Normal signal and morphology. Posterior Fossa, vertebral arteries, paraspinal tissues: Negative. Disc levels: The degree of motion severely limits assessment for subtle disc and foraminal pathology. There is no spinal canal stenosis. MRI THORACIC SPINE FINDINGS Alignment:  Physiologic. Vertebrae: No fracture, evidence of discitis, or bone lesion. Cord: At the T11-12 levels there is signal abnormality of the left ventral aspect of the spinal canal likely indicating an epidural collection (22:35, 23:37, 21:10). Paraspinal and other soft tissues: There are bilateral pleural effusions, left-greater-than-right. There is a large area of paravertebral phlegmon at the T9-11 levels. Disc levels: There is no disc signal abnormality. There is a intermediate size central disc protrusion at T8-9 effaces the ventral thecal sac without causing spinal canal stenosis. IMPRESSION: 1. Large paravertebral phlegmon at the T9-T11 levels with left ventral epidural collection, concerning for epidural abscess. Postcontrast imaging  of the thoracic spine and pre and postcontrast imaging of the lumbar spine is recommended for further assessment. 2. No evidence of discitis-osteomyelitis despite the above. The epidural abnormality likely result from direct extension of the paravertebral collection. 3. Motion degraded cervical spine without acute abnormality. Electronically Signed   By: Ulyses Jarred M.D.   On: 10/16/2018 00:09   Mr Thoracic Spine Wo Contrast  Result Date: 10/16/2018 CLINICAL DATA:  Launch template MR cervicothoracic EXAM: MRI CERVICAL AND THORACIC SPINE WITHOUT CONTRAST TECHNIQUE: Multiplanar and multiecho pulse sequences of the cervical and thoracic spine were obtained without intravenous contrast. COMPARISON:  None. FINDINGS: Examination is markedly degraded by  motion MRI CERVICAL SPINE FINDINGS Alignment: Physiologic. Vertebrae: No fracture, evidence of discitis, or bone lesion. Cord: Normal signal and morphology. Posterior Fossa, vertebral arteries, paraspinal tissues: Negative. Disc levels: The degree of motion severely limits assessment for subtle disc and foraminal pathology. There is no spinal canal stenosis. MRI THORACIC SPINE FINDINGS Alignment:  Physiologic. Vertebrae: No fracture, evidence of discitis, or bone lesion. Cord: At the T11-12 levels there is signal abnormality of the left ventral aspect of the spinal canal likely indicating an epidural collection (22:35, 23:37, 21:10). Paraspinal and other soft tissues: There are bilateral pleural effusions, left-greater-than-right. There is a large area of paravertebral phlegmon at the T9-11 levels. Disc levels: There is no disc signal abnormality. There is a intermediate size central disc protrusion at T8-9 effaces the ventral thecal sac without causing spinal canal stenosis. IMPRESSION: 1. Large paravertebral phlegmon at the T9-T11 levels with left ventral epidural collection, concerning for epidural abscess. Postcontrast imaging of the thoracic spine and pre and  postcontrast imaging of the lumbar spine is recommended for further assessment. 2. No evidence of discitis-osteomyelitis despite the above. The epidural abnormality likely result from direct extension of the paravertebral collection. 3. Motion degraded cervical spine without acute abnormality. Electronically Signed   By: Ulyses Jarred M.D.   On: 10/16/2018 00:09    Assessment/Plan:     Active Problems:   Sepsis ([place])   Plan irrigation and debridement of the left knee today. Trace staph. Aureus found.   The diagnosis, risks, benefits and alternatives to treatment are all discussed in detail with the patient and family. Risks include but are not limited to bleeding, infection, deep vein thrombosis, pulmonary embolism, nerve or vascular injury, non-union, repeat operation, persistent pain, weakness, stiffness and death. She understands and is eager to proceed.  Patient is known cocaine positive, however further delay of her surgery may result in worsening of her sepsis. The irrigation and debridement today is therefore urgent and patient understands the risks. Lovell Sheehan , MD 10/16/2018, 9:56 AM

## 2018-10-16 NOTE — Transfer of Care (Signed)
Immediate Anesthesia Transfer of Care Note  Patient: [name]  Procedure(s) Performed: IRRIGATION AND DEBRIDEMENT KNEE ARTHROSCOPY (Left Knee)  Patient Location: PACU  Anesthesia Type:General  Level of Consciousness: awake, oriented and patient cooperative  Airway & Oxygen Therapy: Patient Spontanous Breathing and Patient connected to face mask oxygen  Post-op Assessment: Report given to RN, Post -op Vital signs reviewed and stable and Patient moving all extremities  Post vital signs: Reviewed and stable  Last Vitals:  Vitals Value Taken Time  BP 121/62 10/16/18 1607  Temp 37.7 C 10/16/18 1607  Pulse 92 10/16/18 1611  Resp 14 10/16/18 1611  SpO2 92 % 10/16/18 1611  Vitals shown include unvalidated device data.  Last Pain:  Vitals:   10/16/18 1607  TempSrc:   PainSc: 0-No pain         Complications: No apparent anesthesia complications

## 2018-10-16 NOTE — Anesthesia Postprocedure Evaluation (Signed)
Anesthesia Post Note  Patient: [name]  Procedure(s) Performed: IRRIGATION AND DEBRIDEMENT KNEE ARTHROSCOPY (Left Knee)  Patient location during evaluation: PACU Anesthesia Type: General Level of consciousness: awake and alert Pain management: pain level controlled Vital Signs Assessment: post-procedure vital signs reviewed and stable Respiratory status: spontaneous breathing and respiratory function stable Cardiovascular status: stable Anesthetic complications: no     Last Vitals:  Vitals:   10/16/18 1637 10/16/18 1647  BP: 133/77 136/77  Pulse: 85 85  Resp: 15 15  Temp:  37.9 C  SpO2: 93% 95%    Last Pain:  Vitals:   10/16/18 1647  TempSrc:   PainSc: 0-No pain                 KEPHART,WILLIAM K

## 2018-10-16 NOTE — Consult Note (Signed)
Referring Physician:  No referring provider defined for this encounter.  Primary Physician:  Marguerita Merles, MD  Chief Complaint:  sepsis  History of Present Illness: 10/16/2018 [name] is a 53 y.o. female who presents with the chief complaint of altered mental status 3 days ago.  She was admitted with sepsis, was positive for cocaine and marijuana.  She was admitted and found to have multiple sites of active infection.  She had thoracic spine MRI performed with an area of epidural abscess.  She had her left knee washed out today.    She denies weakness, sensory changes, and was able to walk prior to knee washout, per patient.  Denies bowel or bladder dysfunction.  Review of Systems:  A 10 point review of systems is negative, except for the pertinent positives and negatives detailed in the HPI.  Past Medical History: Past Medical History:  Diagnosis Date  . Anxiety   . Arthritis    PSORIATIC ARTHRITIS  . Back pain   . Colon polyp   . Depression   . Headache    MIGRAINE  . Ulcer     Past Surgical History: Past Surgical History:  Procedure Laterality Date  . BREAST REDUCTION SURGERY    . COLONOSCOPY WITH ESOPHAGOGASTRODUODENOSCOPY (EGD)    . COLONOSCOPY WITH PROPOFOL N/A 05/19/2016   Procedure: COLONOSCOPY WITH PROPOFOL;  Surgeon: Lollie Sails, MD;  Location: [facility] ENDOSCOPY;  Service: Endoscopy;  Laterality: N/A;  . COLONOSCOPY WITH PROPOFOL N/A 03/06/2018   Procedure: COLONOSCOPY WITH PROPOFOL;  Surgeon: Toledo, Benay Pike, MD;  Location: [facility] ENDOSCOPY;  Service: Gastroenterology;  Laterality: N/A;  . ESOPHAGOGASTRODUODENOSCOPY (EGD) WITH PROPOFOL N/A 03/06/2018   Procedure: ESOPHAGOGASTRODUODENOSCOPY (EGD) WITH PROPOFOL;  Surgeon: Toledo, Benay Pike, MD;  Location: [facility] ENDOSCOPY;  Service: Gastroenterology;  Laterality: N/A;  . foot spurs    . FRACTURE SURGERY    . MANDIBLE FRACTURE SURGERY    . TUBAL LIGATION      Allergies: Allergies as of 10/13/2018 -  Review Complete 10/13/2018  Allergen Reaction Noted  . Topiramate Hives 01/05/2009    Medications:  Current Facility-Administered Medications:  .  0.9 % NaCl with KCl 20 mEq/ L  infusion, , Intravenous, Continuous, Wieting, Richard, MD, Last Rate: 75 mL/hr at 10/15/18 2345 .  acetaminophen (TYLENOL) tablet 650 mg, 650 mg, Oral, Q6H PRN, 650 mg at 10/14/18 0329 **OR** acetaminophen (TYLENOL) suppository 650 mg, 650 mg, Rectal, Q6H PRN, Wieting, Richard, MD .  ALPRAZolam Duanne Moron) tablet 0.25 mg, 0.25 mg, Oral, TID PRN, Loletha Grayer, MD, 0.25 mg at 10/15/18 1320 .  diazepam (VALIUM) tablet 1 mg, 1 mg, Oral, BID, Vaughan Basta, MD, 1 mg at 10/15/18 1957 .  escitalopram (LEXAPRO) tablet 30 mg, 30 mg, Oral, Daily, Wieting, Richard, MD, 30 mg at 10/15/18 0830 .  fluticasone (FLONASE) 50 MCG/ACT nasal spray 1 spray, 1 spray, Each Nare, Daily, Leslye Peer, Richard, MD, 1 spray at 10/15/18 1320 .  ketorolac (TORADOL) 15 MG/ML injection 15 mg, 15 mg, Intravenous, Q8H, Vaughan Basta, MD, 15 mg at 10/16/18 0510 .  lactated ringers infusion, , Intravenous, Continuous, Penwarden, Amy, MD, Last Rate: 100 mL/hr at 10/16/18 1405 .  LORazepam (ATIVAN) injection 2 mg, 2 mg, Intravenous, Once, Vaughan Basta, MD .  morphine 2 MG/ML injection 2 mg, 2 mg, Intravenous, Q4H PRN, Vaughan Basta, MD, 2 mg at 10/16/18 1209 .  nafcillin 2 g in sodium chloride 0.9 % 100 mL IVPB, 2 g, Intravenous, Q4H, Ravishankar, Jayashree, MD, Last Rate: 200  mL/hr at 10/16/18 1312, 2 g at 10/16/18 1312 .  ondansetron (ZOFRAN) tablet 4 mg, 4 mg, Oral, Q6H PRN **OR** ondansetron (ZOFRAN) injection 4 mg, 4 mg, Intravenous, Q6H PRN, Wieting, Richard, MD .  oxyCODONE (Oxy IR/ROXICODONE) immediate release tablet 5 mg, 5 mg, Oral, Q4H PRN, Vaughan Basta, MD, 5 mg at 10/15/18 1939 .  pantoprazole (PROTONIX) EC tablet 40 mg, 40 mg, Oral, BID, Vaughan Basta, MD, 40 mg at 10/15/18 1949 .  sodium  chloride flush (NS) 0.9 % injection 10-40 mL, 10-40 mL, Intracatheter, Q12H, Ravishankar, Jayashree, MD .  sodium chloride flush (NS) 0.9 % injection 10-40 mL, 10-40 mL, Intracatheter, PRN, Tsosie Billing, MD .  ziprasidone (GEODON) injection 10 mg, 10 mg, Intramuscular, Q6H PRN, Mansy, Jan A, MD, 10 mg at 10/15/18 0224   Social History: Social History   Tobacco Use  . Smoking status: Former Smoker    Types: Cigarettes  . Smokeless tobacco: Never Used  Substance Use Topics  . Alcohol use: Never    Frequency: Never  . Drug use: Yes    Types: "Crack" cocaine, Marijuana    Family Medical History: Family History  Problem Relation Age of Onset  . Liver cancer Mother   . Liver disease Mother   . Cirrhosis Mother   . Alcohol abuse Mother   . Cancer Father   . Colon cancer Maternal Aunt     Physical Examination: Vitals:   10/16/18 1637 10/16/18 1647  BP: 133/77 136/77  Pulse: 85 85  Resp: 15 15  Temp:  100.2 F (37.9 C)  SpO2: 93% 95%     General: Patient is well developed, well nourished, calm, collected, and in no apparent distress.  Psychiatric: Patient is non-anxious.  Head:  Pupils equal, round, and reactive to light.  ENT:  Oral mucosa appears well hydrated.  Neck:   Supple.  Full range of motion.  Respiratory: Patient is breathing without any difficulty.  Extremities: No edema.  Vascular: Palpable pulses in dorsal pedal vessels.  Skin:   On exposed skin, there are no abnormal skin lesions.  NEUROLOGICAL:  General: In no acute distress.   Awake, alert, oriented to person, place, and time.  Pupils equal round and reactive to light.  Facial tone is symmetric.  Tongue protrusion is midline.  There is no pronator drift.  Strength: Side Biceps Triceps Deltoid Interossei Grip Wrist Ext. Wrist Flex.  R 5 5 5 5 5 5 5   L 5 5 5 5 5 5 5    Side Iliopsoas Quads Hamstring PF DF EHL  R 5 5 5 5 5 5   L 5 - - 5 5 5     Patient had left knee washed out  today, so knee exam is limited. Reflexes are 1+ and symmetric at the biceps, triceps, brachioradialis, patella and achilles.   Bilateral upper and lower extremity sensation is intact to light touch and pin prick.  Clonus is not present.  Toes are down-going.  Gait is untested.   Hoffman's is absent.  Imaging: MRI CT spine 10/15/2018 IMPRESSION: 1. Large paravertebral phlegmon at the T9-T11 levels with left ventral epidural collection, concerning for epidural abscess. Postcontrast imaging of the thoracic spine and pre and postcontrast imaging of the lumbar spine is recommended for further assessment. 2. No evidence of discitis-osteomyelitis despite the above. The epidural abnormality likely result from direct extension of the paravertebral collection. 3. Motion degraded cervical spine without acute abnormality.   Electronically Signed   By: Cletus Gash.D.  On: 10/16/2018 00:09  I have personally reviewed the images and agree with the above interpretation.  Assessment and Plan: Ms. Degroote is a pleasant 53 y.o. 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  She has no spinal cord dysfunction at this point, so I would not recommend surgical intervention.  I would recommend completion of thoracic and lumbar spine MRIs to fully evaluate the extent of disease.  - Continue abx per Dr. Gwenevere Ghazi recommendations - No surgical intervention at this point.    Kourtland Coopman K. Izora Ribas MD, [facility]

## 2018-10-16 NOTE — Progress Notes (Signed)
Date of Admission:  10/13/2018    Disseminated MSSA infection  Objective: Vital signs in last 24 hours: Temp:  [98.9 F (37.2 C)] 98.9 F (37.2 C) (06/23 1944) Pulse Rate:  [89] 89 (06/23 1944) Resp:  [16] 16 (06/23 1944) BP: (140)/(84) 140/84 (06/23 1944) SpO2:  [95 %] 95 % (06/23 1944)  PHYSICAL EXAM:  Skin: No rashes or lesions. Or bruising Lymph: Cervical, supraclavicular normal. Neurologic: Grossly non-focal  Lab Results Recent Labs    10/15/18 0906 10/16/18 0010  WBC 22.1* 24.3*  HGB 9.8* 8.5*  HCT 29.4* 25.5*  NA 131* 130*  K 3.9 3.7  CL 98 97*  CO2 25 25  BUN 15 11  CREATININE 0.63 0.57   Liver Panel Recent Labs    10/14/18 0416 10/15/18 0906 10/16/18 0010  PROT 5.4* 5.3* 5.2*  ALBUMIN 1.9* 1.8* 1.7*  AST 59* 23 19  ALT 42 25 18  ALKPHOS 584* 458* 436*  BILITOT 2.9* 1.6* 1.5*  BILIDIR 2.1*  --   --   IBILI 0.8  --   --    Sedimentation Rate Recent Labs    10/14/18 0900  ESRSEDRATE 56*   C-Reactive Protein No results for input(s): CRP in the last 72 hours.  Microbiology:  Studies/Results: Dg Chest 2 View  Result Date: 10/15/2018 CLINICAL DATA:  Initial evaluation for acute hypoxia, sepsis. EXAM: CHEST - 2 VIEW COMPARISON:  Prior radiograph from 10/13/2018. FINDINGS: Cardiac and mediastinal silhouettes are stable in size and contour, and remain within normal limits. Lungs mildly hypoinflated. Persistent small left pleural effusion. Associated left basilar opacity has mildly increased from previous, most worrisome for progressive infiltrate. Atelectatic changes may be contributory. No other focal airspace disease. No pulmonary edema. No pneumothorax. No acute osseous finding. IMPRESSION: Persistent small left pleural effusion with associated left basilar opacity, mildly worsened from previous, most worrisome for progressive infiltrate. Electronically Signed   By: Jeannine Boga M.D.   On: 10/15/2018 17:50   Mr Cervical Spine Wo  Contrast  Result Date: 10/16/2018 CLINICAL DATA:  Launch template MR cervicothoracic EXAM: MRI CERVICAL AND THORACIC SPINE WITHOUT CONTRAST TECHNIQUE: Multiplanar and multiecho pulse sequences of the cervical and thoracic spine were obtained without intravenous contrast. COMPARISON:  None. FINDINGS: Examination is markedly degraded by motion MRI CERVICAL SPINE FINDINGS Alignment: Physiologic. Vertebrae: No fracture, evidence of discitis, or bone lesion. Cord: Normal signal and morphology. Posterior Fossa, vertebral arteries, paraspinal tissues: Negative. Disc levels: The degree of motion severely limits assessment for subtle disc and foraminal pathology. There is no spinal canal stenosis. MRI THORACIC SPINE FINDINGS Alignment:  Physiologic. Vertebrae: No fracture, evidence of discitis, or bone lesion. Cord: At the T11-12 levels there is signal abnormality of the left ventral aspect of the spinal canal likely indicating an epidural collection (22:35, 23:37, 21:10). Paraspinal and other soft tissues: There are bilateral pleural effusions, left-greater-than-right. There is a large area of paravertebral phlegmon at the T9-11 levels. Disc levels: There is no disc signal abnormality. There is a intermediate size central disc protrusion at T8-9 effaces the ventral thecal sac without causing spinal canal stenosis. IMPRESSION: 1. Large paravertebral phlegmon at the T9-T11 levels with left ventral epidural collection, concerning for epidural abscess. Postcontrast imaging of the thoracic spine and pre and postcontrast imaging of the lumbar spine is recommended for further assessment. 2. No evidence of discitis-osteomyelitis despite the above. The epidural abnormality likely result from direct extension of the paravertebral collection. 3. Motion degraded cervical spine without acute abnormality. Electronically  Signed   By: Ulyses Jarred M.D.   On: 10/16/2018 00:09   Mr Thoracic Spine Wo Contrast  Result Date:  10/16/2018 CLINICAL DATA:  Launch template MR cervicothoracic EXAM: MRI CERVICAL AND THORACIC SPINE WITHOUT CONTRAST TECHNIQUE: Multiplanar and multiecho pulse sequences of the cervical and thoracic spine were obtained without intravenous contrast. COMPARISON:  None. FINDINGS: Examination is markedly degraded by motion MRI CERVICAL SPINE FINDINGS Alignment: Physiologic. Vertebrae: No fracture, evidence of discitis, or bone lesion. Cord: Normal signal and morphology. Posterior Fossa, vertebral arteries, paraspinal tissues: Negative. Disc levels: The degree of motion severely limits assessment for subtle disc and foraminal pathology. There is no spinal canal stenosis. MRI THORACIC SPINE FINDINGS Alignment:  Physiologic. Vertebrae: No fracture, evidence of discitis, or bone lesion. Cord: At the T11-12 levels there is signal abnormality of the left ventral aspect of the spinal canal likely indicating an epidural collection (22:35, 23:37, 21:10). Paraspinal and other soft tissues: There are bilateral pleural effusions, left-greater-than-right. There is a large area of paravertebral phlegmon at the T9-11 levels. Disc levels: There is no disc signal abnormality. There is a intermediate size central disc protrusion at T8-9 effaces the ventral thecal sac without causing spinal canal stenosis. IMPRESSION: 1. Large paravertebral phlegmon at the T9-T11 levels with left ventral epidural collection, concerning for epidural abscess. Postcontrast imaging of the thoracic spine and pre and postcontrast imaging of the lumbar spine is recommended for further assessment. 2. No evidence of discitis-osteomyelitis despite the above. The epidural abnormality likely result from direct extension of the paravertebral collection. 3. Motion degraded cervical spine without acute abnormality. Electronically Signed   By: Ulyses Jarred M.D.   On: 10/16/2018 00:09     Assessment/Plan: 53 year old female with history of substance use presents  with left knee pain, left toe pain and purplish discoloration and also lethargy. Also has been having back pain especially neck pain for which she had 2 prior ED visits on 6/10 and 10/04/2018.  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 Concern for endocarditis. Septic emboli to the toe. 2D echo done -will need TEE .  Currently on cefazolin- will change to nafcillin because of persistent leucocytosis and increased bioburden  Thoracic vertebral infection- Large paravertebral phlegmon at the T9-T11 levels with left ventral epidural collection, concerning for epidural abscess.Postcontrast imaging of the thoracic spine and pre and postcontrast imaging of the lumbar spine is recommended for further assessment. MRI with contrast pending   Abnormal LFTs-HIV and hepatitis panel negative  AST more than ALT. high alkaline phosphatase improving-ultrasound GB no stone   I Discussed the management with care team

## 2018-10-16 NOTE — Anesthesia Procedure Notes (Signed)
Procedure Name: LMA Insertion Date/Time: 10/16/2018 3:09 PM Performed by: Lowry Bowl, CRNA Pre-anesthesia Checklist: Patient identified, Emergency Drugs available, Suction available and Patient being monitored Patient Re-evaluated:Patient Re-evaluated prior to induction Oxygen Delivery Method: Circle system utilized Preoxygenation: Pre-oxygenation with 100% oxygen Induction Type: IV induction Ventilation: Mask ventilation without difficulty LMA: LMA inserted LMA Size: 4.0 Number of attempts: 1 Placement Confirmation: positive ETCO2 and breath sounds checked- equal and bilateral Tube secured with: Tape Dental Injury: Teeth and Oropharynx as per pre-operative assessment

## 2018-10-16 NOTE — TOC Progression Note (Signed)
Transition of Care [facility]) - Progression Note    Patient Details  Name: [name] MRN: [id number] Date of Birth: [birth year]  [facility] CM/SW Contact  Shelbie Hutching, RN Phone Number: 10/16/2018, 10:06 AM  Clinical Narrative:    Patient will go to the OR today for I&D of right knee.  Patient is agreeable to home health services at discharge and reports that she has a place to go, reports she put a deposit down.  RNCM will cont to follow.     Expected Discharge Plan: Home/Self Care Barriers to Discharge: Continued Medical Work up  Expected Discharge Plan and Services Expected Discharge Plan: Home/Self Care   Discharge Planning Services: CM Consult   Living arrangements for the past 2 months: Single Family Home                                       Social Determinants of Health (SDOH) Interventions    Readmission Risk Interventions No flowsheet data found.

## 2018-10-16 NOTE — Anesthesia Post-op Follow-up Note (Signed)
Anesthesia QCDR form completed.        

## 2018-10-16 NOTE — Anesthesia Preprocedure Evaluation (Signed)
Anesthesia Evaluation  Patient identified by MRN, date of birth, ID band Patient awake    Reviewed: Allergy & Precautions, NPO status , Patient's Chart, lab work & pertinent test results  History of Anesthesia Complications Negative for: history of anesthetic complications  Airway Mallampati: II  TM Distance: >3 FB Neck ROM: Full    Dental  (+) Poor Dentition   Pulmonary neg sleep apnea, neg COPD, former smoker,    breath sounds clear to auscultation- rhonchi (-) wheezing      Cardiovascular (-) hypertension(-) CAD, (-) Past MI, (-) Cardiac Stents and (-) CABG  Rhythm:Regular Rate:Normal - Systolic murmurs and - Diastolic murmurs    Neuro/Psych  Headaches, neg Seizures PSYCHIATRIC DISORDERS Anxiety Depression Bipolar Disorder    GI/Hepatic Neg liver ROS, GERD  ,  Endo/Other  negative endocrine ROSneg diabetes  Renal/GU negative Renal ROS     Musculoskeletal  (+) Arthritis ,   Abdominal (+) + obese,   Peds  Hematology negative hematology ROS (+)   Anesthesia Other Findings Past Medical History: No date: Anxiety No date: Arthritis     Comment:  PSORIATIC ARTHRITIS No date: Back pain No date: Colon polyp No date: Depression No date: Headache     Comment:  MIGRAINE No date: Ulcer   Reproductive/Obstetrics                             Anesthesia Physical Anesthesia Plan  ASA: III  Anesthesia Plan: General   Post-op Pain Management:    Induction: Intravenous  PONV Risk Score and Plan: 2 and Ondansetron, Dexamethasone and Midazolam  Airway Management Planned: LMA  Additional Equipment:   Intra-op Plan:   Post-operative Plan:   Informed Consent: I have reviewed the patients History and Physical, chart, labs and discussed the procedure including the risks, benefits and alternatives for the proposed anesthesia with the patient or authorized representative who has indicated  his/her understanding and acceptance.     Dental advisory given  Plan Discussed with: CRNA and Anesthesiologist  Anesthesia Plan Comments: (UDS positive for cocaine, per surgeon case cannot be delayed due to worsening sepsis)        Anesthesia Quick Evaluation

## 2018-10-17 ENCOUNTER — Encounter: Payer: Self-pay | Admitting: Orthopedic Surgery

## 2018-10-17 ENCOUNTER — Inpatient Hospital Stay: Payer: Medicaid Other

## 2018-10-17 LAB — URINE DRUG SCREEN, QUALITATIVE (ARMC ONLY)
Amphetamines, Ur Screen: NOT DETECTED
Barbiturates, Ur Screen: NOT DETECTED
Benzodiazepine, Ur Scrn: POSITIVE — AB
Cannabinoid 50 Ng, Ur ~~LOC~~: POSITIVE — AB
Cocaine Metabolite,Ur ~~LOC~~: NOT DETECTED
MDMA (Ecstasy)Ur Screen: NOT DETECTED
Methadone Scn, Ur: NOT DETECTED
Opiate, Ur Screen: POSITIVE — AB
Phencyclidine (PCP) Ur S: NOT DETECTED
Tricyclic, Ur Screen: NOT DETECTED

## 2018-10-17 MED ORDER — BUPROPION HCL ER (XL) 150 MG PO TB24
300.0000 mg | ORAL_TABLET | Freq: Every day | ORAL | Status: DC
  Administered 2018-10-17 – 2018-11-11 (×26): 300 mg via ORAL
  Filled 2018-10-17 (×27): qty 2

## 2018-10-17 MED ORDER — SODIUM CHLORIDE 0.9 % IV SOLN
INTRAVENOUS | Status: AC
  Administered 2018-10-17 – 2018-11-04 (×19): via INTRAVENOUS
  Filled 2018-10-17 (×22): qty 500

## 2018-10-17 MED ORDER — IOHEXOL 300 MG/ML  SOLN
75.0000 mL | Freq: Once | INTRAMUSCULAR | Status: AC | PRN
  Administered 2018-10-17: 16:00:00 75 mL via INTRAVENOUS

## 2018-10-17 NOTE — Progress Notes (Signed)
[place] [facility] [place] NAME: [name]    MR#:  [id number]  DATE OF BIRTH:  June 17, 1965  SUBJECTIVE:  CHIEF COMPLAINT:   Chief Complaint  Patient presents with  . Altered Mental Status   Came with AMS, c/o pain all over. Status post knee I&D. Was n.p.o. for TEE but procedure canceled until tomorrow.  REVIEW OF SYSTEMS:  CONSTITUTIONAL: No fever, fatigue or weakness.  EYES: No blurred or double vision.  EARS, NOSE, AND THROAT: No tinnitus or ear pain.  RESPIRATORY: No cough, shortness of breath, wheezing or hemoptysis.  CARDIOVASCULAR: No chest pain, orthopnea, edema.  GASTROINTESTINAL: No nausea, vomiting, diarrhea or abdominal pain.  GENITOURINARY: No dysuria, hematuria.  ENDOCRINE: No polyuria, nocturia,  HEMATOLOGY: No anemia, easy bruising or bleeding SKIN: No rash or lesion. MUSCULOSKELETAL: No joint pain or arthritis.   NEUROLOGIC: No tingling, numbness, weakness.  PSYCHIATRY: No anxiety or depression.   ROS  DRUG ALLERGIES:   Allergies  Allergen Reactions  . Topiramate Hives    VITALS:  Blood pressure (!) 132/92, pulse 81, temperature 97.7 F (36.5 C), resp. rate 18, height 5\' 4"  (1.626 m), weight 80.7 kg, last menstrual period 01/22/2017, SpO2 92 %.  PHYSICAL EXAMINATION:   GENERAL:  53 y.o.-year-old patient lying in the bed with no acute distress.  EYES: Pupils equal, round, reactive to light and accommodation. No scleral icterus. Extraocular muscles intact.  HEENT: Head atraumatic, normocephalic. Oropharynx and nasopharynx clear.  NECK:  Supple, no jugular venous distention. No thyroid enlargement, no tenderness.  LUNGS: Normal breath sounds bilaterally, no wheezing, rales,rhonchi or crepitation. No use of accessory muscles of respiration.  CARDIOVASCULAR: S1, S2 normal. No murmurs, rubs, or gallops.  ABDOMEN: Soft, nontender, distended. Bowel sounds present. No organomegaly or mass.  EXTREMITIES: No pedal edema,  cyanosis, or clubbing.  Left knee less swollen after joint effusion tapped. NEUROLOGIC: Cranial nerves II through XII are intact. Muscle strength 5/5 in all extremities. Sensation intact. Gait not checked.  PSYCHIATRIC: The patient is alert and oriented x 3.  SKIN: No rash, lesion, or ulcer. Left 3rd toe have swelling. s/p I & D on toe- dressing in place. Marland Kitchen   Physical Exam LABORATORY PANEL:   CBC Recent Labs  Lab 10/16/18 0010  WBC 24.3*  HGB 8.5*  HCT 25.5*  PLT 501*   ------------------------------------------------------------------------------------------------------------------  Chemistries  Recent Labs  Lab 10/13/18 1419  10/16/18 0010  NA 127*   < > 130*  K 2.8*   < > 3.7  CL 83*   < > 97*  CO2 31   < > 25  GLUCOSE 159*   < > 106*  BUN 28*   < > 11  CREATININE 1.01*   < > 0.57  CALCIUM 7.8*   < > 7.4*  MG 2.6*  --   --   AST 88*   < > 19  ALT 47*   < > 18  ALKPHOS 759*   < > 436*  BILITOT 3.8*   < > 1.5*   < > = values in this interval not displayed.   ------------------------------------------------------------------------------------------------------------------  Cardiac Enzymes Recent Labs  Lab 10/13/18 1419  TROPONINI 0.05*   ------------------------------------------------------------------------------------------------------------------  RADIOLOGY:  Mr Cervical Spine Wo Contrast  Result Date: 10/16/2018 CLINICAL DATA:  Launch template MR cervicothoracic EXAM: MRI CERVICAL AND THORACIC SPINE WITHOUT CONTRAST TECHNIQUE: Multiplanar and multiecho pulse sequences of the cervical and thoracic spine were obtained without intravenous contrast. COMPARISON:  None.  FINDINGS: Examination is markedly degraded by motion MRI CERVICAL SPINE FINDINGS Alignment: Physiologic. Vertebrae: No fracture, evidence of discitis, or bone lesion. Cord: Normal signal and morphology. Posterior Fossa, vertebral arteries, paraspinal tissues: Negative. Disc levels: The degree of  motion severely limits assessment for subtle disc and foraminal pathology. There is no spinal canal stenosis. MRI THORACIC SPINE FINDINGS Alignment:  Physiologic. Vertebrae: No fracture, evidence of discitis, or bone lesion. Cord: At the T11-12 levels there is signal abnormality of the left ventral aspect of the spinal canal likely indicating an epidural collection (22:35, 23:37, 21:10). Paraspinal and other soft tissues: There are bilateral pleural effusions, left-greater-than-right. There is a large area of paravertebral phlegmon at the T9-11 levels. Disc levels: There is no disc signal abnormality. There is a intermediate size central disc protrusion at T8-9 effaces the ventral thecal sac without causing spinal canal stenosis. IMPRESSION: 1. Large paravertebral phlegmon at the T9-T11 levels with left ventral epidural collection, concerning for epidural abscess. Postcontrast imaging of the thoracic spine and pre and postcontrast imaging of the lumbar spine is recommended for further assessment. 2. No evidence of discitis-osteomyelitis despite the above. The epidural abnormality likely result from direct extension of the paravertebral collection. 3. Motion degraded cervical spine without acute abnormality. Electronically Signed   By: Ulyses Jarred M.D.   On: 10/16/2018 00:09   Mr Thoracic Spine Wo Contrast  Result Date: 10/16/2018 CLINICAL DATA:  Launch template MR cervicothoracic EXAM: MRI CERVICAL AND THORACIC SPINE WITHOUT CONTRAST TECHNIQUE: Multiplanar and multiecho pulse sequences of the cervical and thoracic spine were obtained without intravenous contrast. COMPARISON:  None. FINDINGS: Examination is markedly degraded by motion MRI CERVICAL SPINE FINDINGS Alignment: Physiologic. Vertebrae: No fracture, evidence of discitis, or bone lesion. Cord: Normal signal and morphology. Posterior Fossa, vertebral arteries, paraspinal tissues: Negative. Disc levels: The degree of motion severely limits assessment for  subtle disc and foraminal pathology. There is no spinal canal stenosis. MRI THORACIC SPINE FINDINGS Alignment:  Physiologic. Vertebrae: No fracture, evidence of discitis, or bone lesion. Cord: At the T11-12 levels there is signal abnormality of the left ventral aspect of the spinal canal likely indicating an epidural collection (22:35, 23:37, 21:10). Paraspinal and other soft tissues: There are bilateral pleural effusions, left-greater-than-right. There is a large area of paravertebral phlegmon at the T9-11 levels. Disc levels: There is no disc signal abnormality. There is a intermediate size central disc protrusion at T8-9 effaces the ventral thecal sac without causing spinal canal stenosis. IMPRESSION: 1. Large paravertebral phlegmon at the T9-T11 levels with left ventral epidural collection, concerning for epidural abscess. Postcontrast imaging of the thoracic spine and pre and postcontrast imaging of the lumbar spine is recommended for further assessment. 2. No evidence of discitis-osteomyelitis despite the above. The epidural abnormality likely result from direct extension of the paravertebral collection. 3. Motion degraded cervical spine without acute abnormality. Electronically Signed   By: Ulyses Jarred M.D.   On: 10/16/2018 00:09   Mr Thoracic Spine W Contrast  Result Date: 10/16/2018 CLINICAL DATA:  Follow-up examination for epidural abscess, infection. EXAM: MRI THORACIC AND LUMBAR SPINE WITHOUT AND WITH CONTRAST TECHNIQUE: Multiplanar and multiecho pulse sequences of the thoracic and lumbar spine were obtained without and with intravenous contrast. CONTRAST:  7.5 cc of Gadavist. COMPARISON:  Prior precontrast MRI from 10/15/2018. FINDINGS: MRI THORACIC SPINE FINDINGS Alignment: Limited pre and postcontrast T1 weighted imaging of the thoracic spine was performed. Stable alignment with preservation of the normal thoracic kyphosis. No listhesis. Vertebrae: Vertebral body height  maintained without  evidence for acute or interval fracture. Abnormal enhancement seen within the right posterior aspect of the T11 vertebral body extending into the right T11 pedicle (series 23, image 6). Associated marrow edema seen within this region on prior precontrast MRI. Enhancement extends into the adjacent right T10-11 facet. Finding likely reflects sequelae of osteomyelitis and/or septic arthritis, suspected to be source of infection. Adjacent T10-11 disc grossly maintained without evidence for significant discitis at this time. Additional abnormal enhancement seen about the left T3-4 facet (series 23, image 14), which could also reflect septic arthritis. Enhancement extends into the adjacent right T3 transverse process. Additional more mild enhancement seen about the right T6-7 facet, also suspicious for possible septic arthritis (series 23, image 7). Visualized bone marrow signal intensity otherwise within normal limits. No other definite evidence for osteomyelitis. Cord: Abnormal epidural enhancement with phlegmon and/or abscess seen involving the left ventral epidural space, extending from approximately the T10 level inferiorly through the upper lumbar spine (series 23, image 11). This measures up to approximately 7 mm in maximal diameter at the level of T12. No significant spinal stenosis at this time. Additional abnormal epidural enhancement seen within the dorsal epidural space at the level of T2 through T4 (series 23, image 12). No frank epidural collection. No other abnormal enhancement within the thoracic spinal cord. No other visible intracanalicular collections, although evaluation somewhat limited by motion artifact on axial sequences. Mild peridiscal enhancement noted about the T8-9 disc protrusion. Paraspinal and other soft tissues: Prominent paraspinous phlegmon and enhancement seen extending from T9 through T11, worse on the right. There is a heterogeneous paraspinous abscess at the level of T9-10 that  measures 3.0 x 1.4 x 4.6 cm (AP by transverse by craniocaudad) (series 22, image 31). Probable few additional smaller loculated collections seen within the adjacent right paraspinous soft tissues. Mild soft tissue enhancement seen adjacent to the left T3-4 facet without frank collection. Bilateral pleural effusions with associated atelectasis and/or consolidation noted. Disc levels: T8-9: Central disc protrusion with resultant mild spinal stenosis again noted. Long segment epidural collection extending from approximately the T10 level through the upper lumbar spine without significant spinal stenosis. MRI LUMBAR SPINE FINDINGS Segmentation: Standard. Lowest well-formed disc labeled the L5-S1 level. Alignment: Exaggeration of the normal lumbar lordosis without listhesis. Vertebrae: Vertebral body height maintained without evidence for acute or chronic fracture. Bone marrow signal intensity within normal limits. Subcentimeter benign hemangiomas noted within the L5 and S1 vertebral bodies. No worrisome osseous lesions. Abnormal marrow edema seen about the bilateral L4-5 facets, right worse than left, likely due to septic arthritis. No other definite evidence for osteomyelitis or discitis. Conus medullaris: Extends to the T12-L1 level and appears normal. Extensive abnormal epidural enhancement seen throughout the lumbar spinal canal, compatible with infection. There is a ventral epidural abscess extending from the visualized thoracic spine inferiorly to approximately the L4-5 level. This is maximal in diameter at the level of L1 through L4, measuring approximately 8 mm in greatest diameter. Extension into the right and posterior epidural space at the level of L4 (series 4, image 28). Paraspinal and other soft tissues: Abnormal enhancement and edema adjacent to the right greater than left L4-5 facets due to septic arthritis. Few irregular loculated soft tissue abscesses seen adjacent to the right L4-5 facet, measuring 17  mm (series 7, image 27) and 24 mm (series 7, image 30). Adjacent diffuse edema and enhancement throughout the lower posterior paraspinous soft tissues, compatible with associated myositis. Small amount of free  fluid noted within the pelvis. Visualized visceral structures grossly within normal limits. Disc levels: L1-2: Negative interspace. Small ventral epidural abscess with resultant mild spinal stenosis. Foramina remain patent. L2-3: Negative interspace. Prominent ventral epidural abscess measuring up to 8 mm in maximal diameter. Resultant severe spinal stenosis. Thecal sac is compressed and measures 4 mm in AP diameter at its most narrow point (series 4, image 23). Foramina remain patent. L3-4: Disc desiccation without disc bulge. Ventral epidural abscess with mild moderate facet hypertrophy. Joint effusion on the right, which could reflect early septic arthritis. Fairly severe spinal stenosis with the thecal sac compressed and measuring 4 mm in AP diameter at its most narrow point. L4-5: Negative interspace. Epidural phlegmon/abscess within the ventral epidural space, extending into the right lateral and posterior epidural space. Abscess extends into the right L4-5 foramen. Bilateral facet degeneration with findings concerning for septic arthritis. Adjacent paraspinous abscesses adjacent to the right L4-5 facet. Small joint effusions. Mild canal with bilateral subarticular stenosis. L5-S1: Negative interspace. Moderate facet degeneration. Epidural enhancement without significant collection. Foramina remain patent. IMPRESSION: MRI THORACIC SPINE IMPRESSION: 1. 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 Associated long segment ventral epidural phlegmon/abscess extending from T10 inferiorly through the upper lumbar spine, measuring 7 mm in maximal diameter at the level of T12. No significant spinal stenosis  at this time. 2. Associated right paraspinous abscess measuring up to 4.6 cm at the level of T9-10. 3. Abnormal enhancement about the left T3-4 and right T6-7 facets, also likely reflecting sequelae of septic arthritis. MRI LUMBAR SPINE IMPRESSION: 1. Long segment predominantly ventral epidural abscess extending from the thoracic spine to the level of L4-5, measuring 8 mm in maximal diameter. Resultant severe spinal stenosis at the levels of L2-3 and L3-4. 2. Abnormal edema and enhancement about the right greater than left L4-5 facets, like related to septic arthritis. Few small soft tissue abscesses adjacent to the right L4-5 facet measure up to 2.4 cm. Electronically Signed   By: Jeannine Boga M.D.   On: 10/16/2018 23:59   Mr Lumbar Spine W Wo Contrast  Result Date: 10/16/2018 CLINICAL DATA:  Follow-up examination for epidural abscess, infection. EXAM: MRI THORACIC AND LUMBAR SPINE WITHOUT AND WITH CONTRAST TECHNIQUE: Multiplanar and multiecho pulse sequences of the thoracic and lumbar spine were obtained without and with intravenous contrast. CONTRAST:  7.5 cc of Gadavist. COMPARISON:  Prior precontrast MRI from 10/15/2018. FINDINGS: MRI THORACIC SPINE FINDINGS Alignment: Limited pre and postcontrast T1 weighted imaging of the thoracic spine was performed. Stable alignment with preservation of the normal thoracic kyphosis. No listhesis. Vertebrae: Vertebral body height maintained without evidence for acute or interval fracture. Abnormal enhancement seen within the right posterior aspect of the T11 vertebral body extending into the right T11 pedicle (series 23, image 6). Associated marrow edema seen within this region on prior precontrast MRI. Enhancement extends into the adjacent right T10-11 facet. Finding likely reflects sequelae of osteomyelitis and/or septic arthritis, suspected to be source of infection. Adjacent T10-11 disc grossly maintained without evidence for significant discitis at this  time. Additional abnormal enhancement seen about the left T3-4 facet (series 23, image 14), which could also reflect septic arthritis. Enhancement extends into the adjacent right T3 transverse process. Additional more mild enhancement seen about the right T6-7 facet, also suspicious for possible septic arthritis (series 23, image 7). Visualized bone marrow signal intensity otherwise within normal limits. No other definite  evidence for osteomyelitis. Cord: Abnormal epidural enhancement with phlegmon and/or abscess seen involving the left ventral epidural space, extending from approximately the T10 level inferiorly through the upper lumbar spine (series 23, image 11). This measures up to approximately 7 mm in maximal diameter at the level of T12. No significant spinal stenosis at this time. Additional abnormal epidural enhancement seen within the dorsal epidural space at the level of T2 through T4 (series 23, image 12). No frank epidural collection. No other abnormal enhancement within the thoracic spinal cord. No other visible intracanalicular collections, although evaluation somewhat limited by motion artifact on axial sequences. Mild peridiscal enhancement noted about the T8-9 disc protrusion. Paraspinal and other soft tissues: Prominent paraspinous phlegmon and enhancement seen extending from T9 through T11, worse on the right. There is a heterogeneous paraspinous abscess at the level of T9-10 that measures 3.0 x 1.4 x 4.6 cm (AP by transverse by craniocaudad) (series 22, image 31). Probable few additional smaller loculated collections seen within the adjacent right paraspinous soft tissues. Mild soft tissue enhancement seen adjacent to the left T3-4 facet without frank collection. Bilateral pleural effusions with associated atelectasis and/or consolidation noted. Disc levels: T8-9: Central disc protrusion with resultant mild spinal stenosis again noted. Long segment epidural collection extending from  approximately the T10 level through the upper lumbar spine without significant spinal stenosis. MRI LUMBAR SPINE FINDINGS Segmentation: Standard. Lowest well-formed disc labeled the L5-S1 level. Alignment: Exaggeration of the normal lumbar lordosis without listhesis. Vertebrae: Vertebral body height maintained without evidence for acute or chronic fracture. Bone marrow signal intensity within normal limits. Subcentimeter benign hemangiomas noted within the L5 and S1 vertebral bodies. No worrisome osseous lesions. Abnormal marrow edema seen about the bilateral L4-5 facets, right worse than left, likely due to septic arthritis. No other definite evidence for osteomyelitis or discitis. Conus medullaris: Extends to the T12-L1 level and appears normal. Extensive abnormal epidural enhancement seen throughout the lumbar spinal canal, compatible with infection. There is a ventral epidural abscess extending from the visualized thoracic spine inferiorly to approximately the L4-5 level. This is maximal in diameter at the level of L1 through L4, measuring approximately 8 mm in greatest diameter. Extension into the right and posterior epidural space at the level of L4 (series 4, image 28). Paraspinal and other soft tissues: Abnormal enhancement and edema adjacent to the right greater than left L4-5 facets due to septic arthritis. Few irregular loculated soft tissue abscesses seen adjacent to the right L4-5 facet, measuring 17 mm (series 7, image 27) and 24 mm (series 7, image 30). Adjacent diffuse edema and enhancement throughout the lower posterior paraspinous soft tissues, compatible with associated myositis. Small amount of free fluid noted within the pelvis. Visualized visceral structures grossly within normal limits. Disc levels: L1-2: Negative interspace. Small ventral epidural abscess with resultant mild spinal stenosis. Foramina remain patent. L2-3: Negative interspace. Prominent ventral epidural abscess measuring up to  8 mm in maximal diameter. Resultant severe spinal stenosis. Thecal sac is compressed and measures 4 mm in AP diameter at its most narrow point (series 4, image 23). Foramina remain patent. L3-4: Disc desiccation without disc bulge. Ventral epidural abscess with mild moderate facet hypertrophy. Joint effusion on the right, which could reflect early septic arthritis. Fairly severe spinal stenosis with the thecal sac compressed and measuring 4 mm in AP diameter at its most narrow point. L4-5: Negative interspace. Epidural phlegmon/abscess within the ventral epidural space, extending into the right lateral and posterior epidural space. Abscess extends into  the right L4-5 foramen. Bilateral facet degeneration with findings concerning for septic arthritis. Adjacent paraspinous abscesses adjacent to the right L4-5 facet. Small joint effusions. Mild canal with bilateral subarticular stenosis. L5-S1: Negative interspace. Moderate facet degeneration. Epidural enhancement without significant collection. Foramina remain patent. IMPRESSION: MRI THORACIC SPINE IMPRESSION: 1. 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 Associated long segment ventral epidural phlegmon/abscess extending from T10 inferiorly through the upper lumbar spine, measuring 7 mm in maximal diameter at the level of T12. No significant spinal stenosis at this time. 2. Associated right paraspinous abscess measuring up to 4.6 cm at the level of T9-10. 3. Abnormal enhancement about the left T3-4 and right T6-7 facets, also likely reflecting sequelae of septic arthritis. MRI LUMBAR SPINE IMPRESSION: 1. Long segment predominantly ventral epidural abscess extending from the thoracic spine to the level of L4-5, measuring 8 mm in maximal diameter. Resultant severe spinal stenosis at the levels of L2-3 and L3-4. 2. Abnormal edema and enhancement about the right  greater than left L4-5 facets, like related to septic arthritis. Few small soft tissue abscesses adjacent to the right L4-5 facet measure up to 2.4 cm. Electronically Signed   By: Jeannine Boga M.D.   On: 10/16/2018 23:59    ASSESSMENT AND PLAN:   Active Problems:   Sepsis ([place])  1.  Clinical sepsis with leukocytosis tachycardia and fever.  Staph bacteremia-MSSA  COVID-19 testing negative.  Urinalysis positive.  Patient also has a left third toe that is red and inflamed.  Left knee was tapped with high WBCs in fluid  Aggressive antibiotics with vancomycin and cefepime.   Appreciated podiatry consultation- I & D on toe done. Appreciated orthopedic consultation.       Have 54000 WBCs in knee aspirate, Ortho had done lavage of left knee.      Per ID- need TEE to r/o Inf endocarditis- consult cardio.  Tentative plan for tomorrow. MRI of cervical and thoracic spine shows osteomyelitis and epidural abscess, consulted neurosurgery.  Suggested to do MRI with contrast which is done and as patient does not have any signs of compression on the cord they are suggesting to try drainage by IR, I have consulted IR who suggested to do a CT chest with contrast and possible procedure tomorrow.  2.  Elevated liver function tests.  Could be secondary to drugs.  Ultrasound of the right upper quadrant - no acute findings.  Follow-up liver enzymes.  Coming down.  3.  Hyponatremia and hypokalemia.  IV fluids with potassium in normal saline.  Give oral K. Improved.  4.  Drug abuse.  Will put on low-dose Xanax for now to prevent withdrawal.  Low-dose oxycodone for pain. Give valium.  Will give IV toradol to help pain of septic joint and toe abscess.   Psych and social work to help manage rehab/ child custody issues.  5.  Anxiety depression continue usual medications.   6.  History of ulcers.  Will give Protonix IV.   All the records are reviewed and case discussed with Care Management/Social  Workerr. Management plans discussed with the patient, family and they are in agreement.  CODE STATUS: Full.  TOTAL TIME TAKING CARE OF THIS PATIENT: 35 minutes.   POSSIBLE D/C IN 2-3 DAYS, DEPENDING ON CLINICAL CONDITION.   Vaughan Basta M.D on 10/17/2018   Between 7am to 6pm - Pager - [phone]  After 6pm go to [email or web address] - password  [email or web address]  [facility]  Office  [phone]  CC: Primary care physician; Marguerita Merles, MD  Note: This dictation was prepared with Dragon dictation along with smaller phrase technology. Any transcriptional errors that result from this process are unintentional.

## 2018-10-17 NOTE — Progress Notes (Signed)
[name] at [place] NAME: [name]    MR#:  [id number]  DATE OF BIRTH:  1965/10/29  SUBJECTIVE:  CHIEF COMPLAINT:   Chief Complaint  Patient presents with  . Altered Mental Status   Came with AMS, c/o pain all over. Status post knee I&D.  REVIEW OF SYSTEMS:  CONSTITUTIONAL: No fever, fatigue or weakness.  EYES: No blurred or double vision.  EARS, NOSE, AND THROAT: No tinnitus or ear pain.  RESPIRATORY: No cough, shortness of breath, wheezing or hemoptysis.  CARDIOVASCULAR: No chest pain, orthopnea, edema.  GASTROINTESTINAL: No nausea, vomiting, diarrhea or abdominal pain.  GENITOURINARY: No dysuria, hematuria.  ENDOCRINE: No polyuria, nocturia,  HEMATOLOGY: No anemia, easy bruising or bleeding SKIN: No rash or lesion. MUSCULOSKELETAL: No joint pain or arthritis.   NEUROLOGIC: No tingling, numbness, weakness.  PSYCHIATRY: No anxiety or depression.   ROS  DRUG ALLERGIES:   Allergies  Allergen Reactions  . Topiramate Hives    VITALS:  Blood pressure (!) 132/92, pulse 81, temperature 97.7 F (36.5 C), resp. rate 18, height 5\' 4"  (1.626 m), weight 80.7 kg, last menstrual period 01/22/2017, SpO2 92 %.  PHYSICAL EXAMINATION:   GENERAL:  53 y.o.-year-old patient lying in the bed with no acute distress.  EYES: Pupils equal, round, reactive to light and accommodation. No scleral icterus. Extraocular muscles intact.  HEENT: Head atraumatic, normocephalic. Oropharynx and nasopharynx clear.  NECK:  Supple, no jugular venous distention. No thyroid enlargement, no tenderness.  LUNGS: Normal breath sounds bilaterally, no wheezing, rales,rhonchi or crepitation. No use of accessory muscles of respiration.  CARDIOVASCULAR: S1, S2 normal. No murmurs, rubs, or gallops.  ABDOMEN: Soft, nontender, distended. Bowel sounds present. No organomegaly or mass.  EXTREMITIES: No pedal edema, cyanosis, or clubbing.  Left knee less swollen after joint  effusion tapped. NEUROLOGIC: Cranial nerves II through XII are intact. Muscle strength 5/5 in all extremities. Sensation intact. Gait not checked.  PSYCHIATRIC: The patient is alert and oriented x 3.  SKIN: No rash, lesion, or ulcer. Left 3rd toe have swelling. s/p I & D on toe- dressing in place. Marland Kitchen   Physical Exam LABORATORY PANEL:   CBC Recent Labs  Lab 10/16/18 0010  WBC 24.3*  HGB 8.5*  HCT 25.5*  PLT 501*   ------------------------------------------------------------------------------------------------------------------  Chemistries  Recent Labs  Lab 10/13/18 1419  10/16/18 0010  NA 127*   < > 130*  K 2.8*   < > 3.7  CL 83*   < > 97*  CO2 31   < > 25  GLUCOSE 159*   < > 106*  BUN 28*   < > 11  CREATININE 1.01*   < > 0.57  CALCIUM 7.8*   < > 7.4*  MG 2.6*  --   --   AST 88*   < > 19  ALT 47*   < > 18  ALKPHOS 759*   < > 436*  BILITOT 3.8*   < > 1.5*   < > = values in this interval not displayed.   ------------------------------------------------------------------------------------------------------------------  Cardiac Enzymes Recent Labs  Lab 10/13/18 1419  TROPONINI 0.05*   ------------------------------------------------------------------------------------------------------------------  RADIOLOGY:  Dg Chest 2 View  Result Date: 10/15/2018 CLINICAL DATA:  Initial evaluation for acute hypoxia, sepsis. EXAM: CHEST - 2 VIEW COMPARISON:  Prior radiograph from 10/13/2018. FINDINGS: Cardiac and mediastinal silhouettes are stable in size and contour, and remain within normal limits. Lungs mildly hypoinflated. Persistent small left pleural effusion. Associated  left basilar opacity has mildly increased from previous, most worrisome for progressive infiltrate. Atelectatic changes may be contributory. No other focal airspace disease. No pulmonary edema. No pneumothorax. No acute osseous finding. IMPRESSION: Persistent small left pleural effusion with associated left  basilar opacity, mildly worsened from previous, most worrisome for progressive infiltrate. Electronically Signed   By: Jeannine Boga M.D.   On: 10/15/2018 17:50   Mr Cervical Spine Wo Contrast  Result Date: 10/16/2018 CLINICAL DATA:  Launch template MR cervicothoracic EXAM: MRI CERVICAL AND THORACIC SPINE WITHOUT CONTRAST TECHNIQUE: Multiplanar and multiecho pulse sequences of the cervical and thoracic spine were obtained without intravenous contrast. COMPARISON:  None. FINDINGS: Examination is markedly degraded by motion MRI CERVICAL SPINE FINDINGS Alignment: Physiologic. Vertebrae: No fracture, evidence of discitis, or bone lesion. Cord: Normal signal and morphology. Posterior Fossa, vertebral arteries, paraspinal tissues: Negative. Disc levels: The degree of motion severely limits assessment for subtle disc and foraminal pathology. There is no spinal canal stenosis. MRI THORACIC SPINE FINDINGS Alignment:  Physiologic. Vertebrae: No fracture, evidence of discitis, or bone lesion. Cord: At the T11-12 levels there is signal abnormality of the left ventral aspect of the spinal canal likely indicating an epidural collection (22:35, 23:37, 21:10). Paraspinal and other soft tissues: There are bilateral pleural effusions, left-greater-than-right. There is a large area of paravertebral phlegmon at the T9-11 levels. Disc levels: There is no disc signal abnormality. There is a intermediate size central disc protrusion at T8-9 effaces the ventral thecal sac without causing spinal canal stenosis. IMPRESSION: 1. Large paravertebral phlegmon at the T9-T11 levels with left ventral epidural collection, concerning for epidural abscess. Postcontrast imaging of the thoracic spine and pre and postcontrast imaging of the lumbar spine is recommended for further assessment. 2. No evidence of discitis-osteomyelitis despite the above. The epidural abnormality likely result from direct extension of the paravertebral collection.  3. Motion degraded cervical spine without acute abnormality. Electronically Signed   By: Ulyses Jarred M.D.   On: 10/16/2018 00:09   Mr Thoracic Spine Wo Contrast  Result Date: 10/16/2018 CLINICAL DATA:  Launch template MR cervicothoracic EXAM: MRI CERVICAL AND THORACIC SPINE WITHOUT CONTRAST TECHNIQUE: Multiplanar and multiecho pulse sequences of the cervical and thoracic spine were obtained without intravenous contrast. COMPARISON:  None. FINDINGS: Examination is markedly degraded by motion MRI CERVICAL SPINE FINDINGS Alignment: Physiologic. Vertebrae: No fracture, evidence of discitis, or bone lesion. Cord: Normal signal and morphology. Posterior Fossa, vertebral arteries, paraspinal tissues: Negative. Disc levels: The degree of motion severely limits assessment for subtle disc and foraminal pathology. There is no spinal canal stenosis. MRI THORACIC SPINE FINDINGS Alignment:  Physiologic. Vertebrae: No fracture, evidence of discitis, or bone lesion. Cord: At the T11-12 levels there is signal abnormality of the left ventral aspect of the spinal canal likely indicating an epidural collection (22:35, 23:37, 21:10). Paraspinal and other soft tissues: There are bilateral pleural effusions, left-greater-than-right. There is a large area of paravertebral phlegmon at the T9-11 levels. Disc levels: There is no disc signal abnormality. There is a intermediate size central disc protrusion at T8-9 effaces the ventral thecal sac without causing spinal canal stenosis. IMPRESSION: 1. Large paravertebral phlegmon at the T9-T11 levels with left ventral epidural collection, concerning for epidural abscess. Postcontrast imaging of the thoracic spine and pre and postcontrast imaging of the lumbar spine is recommended for further assessment. 2. No evidence of discitis-osteomyelitis despite the above. The epidural abnormality likely result from direct extension of the paravertebral collection. 3. Motion degraded cervical spine  without acute abnormality. Electronically Signed   By: Ulyses Jarred M.D.   On: 10/16/2018 00:09   Mr Thoracic Spine W Contrast  Result Date: 10/16/2018 CLINICAL DATA:  Follow-up examination for epidural abscess, infection. EXAM: MRI THORACIC AND LUMBAR SPINE WITHOUT AND WITH CONTRAST TECHNIQUE: Multiplanar and multiecho pulse sequences of the thoracic and lumbar spine were obtained without and with intravenous contrast. CONTRAST:  7.5 cc of Gadavist. COMPARISON:  Prior precontrast MRI from 10/15/2018. FINDINGS: MRI THORACIC SPINE FINDINGS Alignment: Limited pre and postcontrast T1 weighted imaging of the thoracic spine was performed. Stable alignment with preservation of the normal thoracic kyphosis. No listhesis. Vertebrae: Vertebral body height maintained without evidence for acute or interval fracture. Abnormal enhancement seen within the right posterior aspect of the T11 vertebral body extending into the right T11 pedicle (series 23, image 6). Associated marrow edema seen within this region on prior precontrast MRI. Enhancement extends into the adjacent right T10-11 facet. Finding likely reflects sequelae of osteomyelitis and/or septic arthritis, suspected to be source of infection. Adjacent T10-11 disc grossly maintained without evidence for significant discitis at this time. Additional abnormal enhancement seen about the left T3-4 facet (series 23, image 14), which could also reflect septic arthritis. Enhancement extends into the adjacent right T3 transverse process. Additional more mild enhancement seen about the right T6-7 facet, also suspicious for possible septic arthritis (series 23, image 7). Visualized bone marrow signal intensity otherwise within normal limits. No other definite evidence for osteomyelitis. Cord: Abnormal epidural enhancement with phlegmon and/or abscess seen involving the left ventral epidural space, extending from approximately the T10 level inferiorly through the upper lumbar  spine (series 23, image 11). This measures up to approximately 7 mm in maximal diameter at the level of T12. No significant spinal stenosis at this time. Additional abnormal epidural enhancement seen within the dorsal epidural space at the level of T2 through T4 (series 23, image 12). No frank epidural collection. No other abnormal enhancement within the thoracic spinal cord. No other visible intracanalicular collections, although evaluation somewhat limited by motion artifact on axial sequences. Mild peridiscal enhancement noted about the T8-9 disc protrusion. Paraspinal and other soft tissues: Prominent paraspinous phlegmon and enhancement seen extending from T9 through T11, worse on the right. There is a heterogeneous paraspinous abscess at the level of T9-10 that measures 3.0 x 1.4 x 4.6 cm (AP by transverse by craniocaudad) (series 22, image 31). Probable few additional smaller loculated collections seen within the adjacent right paraspinous soft tissues. Mild soft tissue enhancement seen adjacent to the left T3-4 facet without frank collection. Bilateral pleural effusions with associated atelectasis and/or consolidation noted. Disc levels: T8-9: Central disc protrusion with resultant mild spinal stenosis again noted. Long segment epidural collection extending from approximately the T10 level through the upper lumbar spine without significant spinal stenosis. MRI LUMBAR SPINE FINDINGS Segmentation: Standard. Lowest well-formed disc labeled the L5-S1 level. Alignment: Exaggeration of the normal lumbar lordosis without listhesis. Vertebrae: Vertebral body height maintained without evidence for acute or chronic fracture. Bone marrow signal intensity within normal limits. Subcentimeter benign hemangiomas noted within the L5 and S1 vertebral bodies. No worrisome osseous lesions. Abnormal marrow edema seen about the bilateral L4-5 facets, right worse than left, likely due to septic arthritis. No other definite  evidence for osteomyelitis or discitis. Conus medullaris: Extends to the T12-L1 level and appears normal. Extensive abnormal epidural enhancement seen throughout the lumbar spinal canal, compatible with infection. There is a ventral epidural abscess extending from the visualized thoracic  spine inferiorly to approximately the L4-5 level. This is maximal in diameter at the level of L1 through L4, measuring approximately 8 mm in greatest diameter. Extension into the right and posterior epidural space at the level of L4 (series 4, image 28). Paraspinal and other soft tissues: Abnormal enhancement and edema adjacent to the right greater than left L4-5 facets due to septic arthritis. Few irregular loculated soft tissue abscesses seen adjacent to the right L4-5 facet, measuring 17 mm (series 7, image 27) and 24 mm (series 7, image 30). Adjacent diffuse edema and enhancement throughout the lower posterior paraspinous soft tissues, compatible with associated myositis. Small amount of free fluid noted within the pelvis. Visualized visceral structures grossly within normal limits. Disc levels: L1-2: Negative interspace. Small ventral epidural abscess with resultant mild spinal stenosis. Foramina remain patent. L2-3: Negative interspace. Prominent ventral epidural abscess measuring up to 8 mm in maximal diameter. Resultant severe spinal stenosis. Thecal sac is compressed and measures 4 mm in AP diameter at its most narrow point (series 4, image 23). Foramina remain patent. L3-4: Disc desiccation without disc bulge. Ventral epidural abscess with mild moderate facet hypertrophy. Joint effusion on the right, which could reflect early septic arthritis. Fairly severe spinal stenosis with the thecal sac compressed and measuring 4 mm in AP diameter at its most narrow point. L4-5: Negative interspace. Epidural phlegmon/abscess within the ventral epidural space, extending into the right lateral and posterior epidural space. Abscess  extends into the right L4-5 foramen. Bilateral facet degeneration with findings concerning for septic arthritis. Adjacent paraspinous abscesses adjacent to the right L4-5 facet. Small joint effusions. Mild canal with bilateral subarticular stenosis. L5-S1: Negative interspace. Moderate facet degeneration. Epidural enhancement without significant collection. Foramina remain patent. IMPRESSION: MRI THORACIC SPINE IMPRESSION: 1. 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 Associated long segment ventral epidural phlegmon/abscess extending from T10 inferiorly through the upper lumbar spine, measuring 7 mm in maximal diameter at the level of T12. No significant spinal stenosis at this time. 2. Associated right paraspinous abscess measuring up to 4.6 cm at the level of T9-10. 3. Abnormal enhancement about the left T3-4 and right T6-7 facets, also likely reflecting sequelae of septic arthritis. MRI LUMBAR SPINE IMPRESSION: 1. Long segment predominantly ventral epidural abscess extending from the thoracic spine to the level of L4-5, measuring 8 mm in maximal diameter. Resultant severe spinal stenosis at the levels of L2-3 and L3-4. 2. Abnormal edema and enhancement about the right greater than left L4-5 facets, like related to septic arthritis. Few small soft tissue abscesses adjacent to the right L4-5 facet measure up to 2.4 cm. Electronically Signed   By: Jeannine Boga M.D.   On: 10/16/2018 23:59   Mr Lumbar Spine W Wo Contrast  Result Date: 10/16/2018 CLINICAL DATA:  Follow-up examination for epidural abscess, infection. EXAM: MRI THORACIC AND LUMBAR SPINE WITHOUT AND WITH CONTRAST TECHNIQUE: Multiplanar and multiecho pulse sequences of the thoracic and lumbar spine were obtained without and with intravenous contrast. CONTRAST:  7.5 cc of Gadavist. COMPARISON:  Prior precontrast MRI from 10/15/2018.  FINDINGS: MRI THORACIC SPINE FINDINGS Alignment: Limited pre and postcontrast T1 weighted imaging of the thoracic spine was performed. Stable alignment with preservation of the normal thoracic kyphosis. No listhesis. Vertebrae: Vertebral body height maintained without evidence for acute or interval fracture. Abnormal enhancement seen within the right posterior aspect of the T11 vertebral body extending into the right T11  pedicle (series 23, image 6). Associated marrow edema seen within this region on prior precontrast MRI. Enhancement extends into the adjacent right T10-11 facet. Finding likely reflects sequelae of osteomyelitis and/or septic arthritis, suspected to be source of infection. Adjacent T10-11 disc grossly maintained without evidence for significant discitis at this time. Additional abnormal enhancement seen about the left T3-4 facet (series 23, image 14), which could also reflect septic arthritis. Enhancement extends into the adjacent right T3 transverse process. Additional more mild enhancement seen about the right T6-7 facet, also suspicious for possible septic arthritis (series 23, image 7). Visualized bone marrow signal intensity otherwise within normal limits. No other definite evidence for osteomyelitis. Cord: Abnormal epidural enhancement with phlegmon and/or abscess seen involving the left ventral epidural space, extending from approximately the T10 level inferiorly through the upper lumbar spine (series 23, image 11). This measures up to approximately 7 mm in maximal diameter at the level of T12. No significant spinal stenosis at this time. Additional abnormal epidural enhancement seen within the dorsal epidural space at the level of T2 through T4 (series 23, image 12). No frank epidural collection. No other abnormal enhancement within the thoracic spinal cord. No other visible intracanalicular collections, although evaluation somewhat limited by motion artifact on axial sequences. Mild  peridiscal enhancement noted about the T8-9 disc protrusion. Paraspinal and other soft tissues: Prominent paraspinous phlegmon and enhancement seen extending from T9 through T11, worse on the right. There is a heterogeneous paraspinous abscess at the level of T9-10 that measures 3.0 x 1.4 x 4.6 cm (AP by transverse by craniocaudad) (series 22, image 31). Probable few additional smaller loculated collections seen within the adjacent right paraspinous soft tissues. Mild soft tissue enhancement seen adjacent to the left T3-4 facet without frank collection. Bilateral pleural effusions with associated atelectasis and/or consolidation noted. Disc levels: T8-9: Central disc protrusion with resultant mild spinal stenosis again noted. Long segment epidural collection extending from approximately the T10 level through the upper lumbar spine without significant spinal stenosis. MRI LUMBAR SPINE FINDINGS Segmentation: Standard. Lowest well-formed disc labeled the L5-S1 level. Alignment: Exaggeration of the normal lumbar lordosis without listhesis. Vertebrae: Vertebral body height maintained without evidence for acute or chronic fracture. Bone marrow signal intensity within normal limits. Subcentimeter benign hemangiomas noted within the L5 and S1 vertebral bodies. No worrisome osseous lesions. Abnormal marrow edema seen about the bilateral L4-5 facets, right worse than left, likely due to septic arthritis. No other definite evidence for osteomyelitis or discitis. Conus medullaris: Extends to the T12-L1 level and appears normal. Extensive abnormal epidural enhancement seen throughout the lumbar spinal canal, compatible with infection. There is a ventral epidural abscess extending from the visualized thoracic spine inferiorly to approximately the L4-5 level. This is maximal in diameter at the level of L1 through L4, measuring approximately 8 mm in greatest diameter. Extension into the right and posterior epidural space at the  level of L4 (series 4, image 28). Paraspinal and other soft tissues: Abnormal enhancement and edema adjacent to the right greater than left L4-5 facets due to septic arthritis. Few irregular loculated soft tissue abscesses seen adjacent to the right L4-5 facet, measuring 17 mm (series 7, image 27) and 24 mm (series 7, image 30). Adjacent diffuse edema and enhancement throughout the lower posterior paraspinous soft tissues, compatible with associated myositis. Small amount of free fluid noted within the pelvis. Visualized visceral structures grossly within normal limits. Disc levels: L1-2: Negative interspace. Small ventral epidural abscess with resultant mild spinal stenosis. Foramina  remain patent. L2-3: Negative interspace. Prominent ventral epidural abscess measuring up to 8 mm in maximal diameter. Resultant severe spinal stenosis. Thecal sac is compressed and measures 4 mm in AP diameter at its most narrow point (series 4, image 23). Foramina remain patent. L3-4: Disc desiccation without disc bulge. Ventral epidural abscess with mild moderate facet hypertrophy. Joint effusion on the right, which could reflect early septic arthritis. Fairly severe spinal stenosis with the thecal sac compressed and measuring 4 mm in AP diameter at its most narrow point. L4-5: Negative interspace. Epidural phlegmon/abscess within the ventral epidural space, extending into the right lateral and posterior epidural space. Abscess extends into the right L4-5 foramen. Bilateral facet degeneration with findings concerning for septic arthritis. Adjacent paraspinous abscesses adjacent to the right L4-5 facet. Small joint effusions. Mild canal with bilateral subarticular stenosis. L5-S1: Negative interspace. Moderate facet degeneration. Epidural enhancement without significant collection. Foramina remain patent. IMPRESSION: MRI THORACIC SPINE IMPRESSION: 1. Findings consistent with osteomyelitis and septic arthritis involving the right  posterior aspect of T11 with involvement of the adjacent T10-11 facet. Associated long segment ventral epidural phlegmon/abscess extending from T10 inferiorly through the upper lumbar spine, measuring 7 mm in maximal diameter at the level of T12. No significant spinal stenosis at this time. 2. Associated right paraspinous abscess measuring up to 4.6 cm at the level of T9-10. 3. Abnormal enhancement about the left T3-4 and right T6-7 facets, also likely reflecting sequelae of septic arthritis. MRI LUMBAR SPINE IMPRESSION: 1. Long segment predominantly ventral epidural abscess extending from the thoracic spine to the level of L4-5, measuring 8 mm in maximal diameter. Resultant severe spinal stenosis at the levels of L2-3 and L3-4. 2. Abnormal edema and enhancement about the right greater than left L4-5 facets, like related to septic arthritis. Few small soft tissue abscesses adjacent to the right L4-5 facet measure up to 2.4 cm. Electronically Signed   By: Jeannine Boga M.D.   On: 10/16/2018 23:59    ASSESSMENT AND PLAN:   Active Problems:   Sepsis ([place])  1.  Clinical sepsis with leukocytosis tachycardia and fever.  Staph bacteremia-MSSA  COVID-19 testing negative.  Urinalysis positive.  Patient also has a left third toe that is red and inflamed.  Left knee was tapped with high WBCs in fluid  Aggressive antibiotics with vancomycin and cefepime.   Appreciated podiatry consultation- I & D on toe done. Appreciated orthopedic consultation.       Have 54000 WBCs in knee aspirate, Ortho had done lavage of left knee.      Per ID- need TEE to r/o Inf endocarditis- consult cardio.  Tentative plan for tomorrow. MRI of cervical and thoracic spine shows osteomyelitis and epidural abscess, consulted neurosurgery.  2.  Elevated liver function tests.  Could be secondary to drugs.  Ultrasound of the right upper quadrant - no acute findings.  Follow-up liver enzymes.  Coming down.  3.  Hyponatremia and  hypokalemia.  IV fluids with potassium in normal saline.  Give oral K. Improved.  4.  Drug abuse.  Will put on low-dose Xanax for now to prevent withdrawal.  Low-dose oxycodone for pain. Give valium.  Will give IV toradol to help pain of septic joint and toe abscess.   Psych and social work to help manage rehab/ child custody issues.  5.  Anxiety depression continue usual medications.   6.  History of ulcers.  Will give Protonix IV.   All the records are reviewed and case discussed with Care  Management/Social Workerr. Management plans discussed with the patient, family and they are in agreement.  CODE STATUS: Full.  TOTAL TIME TAKING CARE OF THIS PATIENT: 35 minutes.   POSSIBLE D/C IN 2-3 DAYS, DEPENDING ON CLINICAL CONDITION.   Vaughan Basta M.D on 10/17/2018   Between 7am to 6pm - Pager - [phone]  After 6pm go to [email or web address] - password [email or web address] [facility]  Office  [phone]  CC: Primary care physician; Marguerita Merles, MD  Note: This dictation was prepared with Dragon dictation along with smaller phrase technology. Any transcriptional errors that result from this process are unintentional.

## 2018-10-17 NOTE — Progress Notes (Signed)
[facility]                                                      Patient Demographics  [name], is a 53 y.o. female   MRN: [id number]   DOB - [birth year]  Admit Date - 10/13/2018    Outpatient Primary MD for the patient is Marguerita Merles, MD  Consult requested in the Hospital by Vaughan Basta, *, On 10/17/2018   With History of -  Past Medical History:  Diagnosis Date  . Anxiety   . Arthritis    PSORIATIC ARTHRITIS  . Back pain   . Colon polyp   . Depression   . Headache    MIGRAINE  . Ulcer       Past Surgical History:  Procedure Laterality Date  . BREAST REDUCTION SURGERY    . COLONOSCOPY WITH ESOPHAGOGASTRODUODENOSCOPY (EGD)    . COLONOSCOPY WITH PROPOFOL N/A 05/19/2016   Procedure: COLONOSCOPY WITH PROPOFOL;  Surgeon: Lollie Sails, MD;  Location: [facility] ENDOSCOPY;  Service: Endoscopy;  Laterality: N/A;  . COLONOSCOPY WITH PROPOFOL N/A 03/06/2018   Procedure: COLONOSCOPY WITH PROPOFOL;  Surgeon: Toledo, Benay Pike, MD;  Location: [facility] ENDOSCOPY;  Service: Gastroenterology;  Laterality: N/A;  . ESOPHAGOGASTRODUODENOSCOPY (EGD) WITH PROPOFOL N/A 03/06/2018   Procedure: ESOPHAGOGASTRODUODENOSCOPY (EGD) WITH PROPOFOL;  Surgeon: Toledo, Benay Pike, MD;  Location: [facility] ENDOSCOPY;  Service: Gastroenterology;  Laterality: N/A;  . foot spurs    . FRACTURE SURGERY    . IRRIGATION AND DEBRIDEMENT KNEE Left 10/16/2018   Procedure: IRRIGATION AND DEBRIDEMENT KNEE ARTHROSCOPY;  Surgeon: Lovell Sheehan, MD;  Location: [facility] ORS;  Service: Orthopedics;  Laterality: Left;  Marland Kitchen MANDIBLE FRACTURE SURGERY    . TUBAL LIGATION      in for   Chief Complaint  Patient presents with  . Altered Mental Status     HPI  [name]  is a 53 y.o. female, patient admitted with swollen knee as well as infection to the left third toe.  She underwent knee  irrigation yesterday.  Getting dressing changes daily on her toe.  Culture shows staph aureus methicillin sensitive    Review of Systems    In addition to the HPI above,  No Fever-chills, No Headache, No changes with Vision or hearing, No problems swallowing food or Liquids, No Chest pain, Cough or Shortness of Breath, No Abdominal pain, No Nausea or Vommitting, Bowel movements are regular, No Blood in stool or Urine, No dysuria, No new skin rashes or bruises, No new joints pains-aches,  No new weakness, tingling, numbness in any extremity, No recent weight gain or loss, No polyuria, polydypsia or polyphagia, No significant Mental Stressors.  A full 10 point Review of Systems was done, except as stated above, all other Review of Systems were negative.   Social History Social History   Tobacco Use  . Smoking status: Former Smoker    Types: Cigarettes  . Smokeless tobacco: Never Used  Substance Use Topics  . Alcohol use: Never    Frequency: Never    Family History Family History  Problem Relation Age of Onset  . Liver cancer Mother   . Liver disease Mother   . Cirrhosis Mother   . Alcohol abuse Mother   . Cancer Father   . Colon cancer Maternal Aunt  Prior to Admission medications   Medication Sig Start Date End Date Taking? Authorizing Provider  ALPRAZolam Duanne Moron) 1 MG tablet Take 1 mg by mouth 4 (four) times daily.   Yes [provider]  amphetamine-dextroamphetamine (ADDERALL) 30 MG tablet Take 30 mg by mouth 2 (two) times daily.    Yes [provider]  buPROPion (WELLBUTRIN SR) 200 MG 12 hr tablet TK 1 T PO  QAM AND QD AT NOON 08/13/18  Yes [provider]  clotrimazole-betamethasone (LOTRISONE) cream APP TO AFFECTED EAR ONCE OR TWICE WEEKLY PRN FOR SKIN IRRITATION. 11/09/16  Yes [provider]  diazepam (VALIUM) 5 MG tablet Take 1 tablet (5 mg total) by mouth every 8 (eight) hours as needed for anxiety. 10/04/18  Yes Paulette Blanch, MD  escitalopram (LEXAPRO) 20 MG tablet Take 20 mg by mouth daily.    Yes [provider]  FLUoxetine (PROZAC) 20 MG capsule TK 1 C PO QD 08/13/18  Yes [provider]  guanFACINE (TENEX) 2 MG tablet Take 2 mg by mouth daily. 09/08/18  Yes [provider]  ibuprofen (ADVIL) 800 MG tablet Take 1 tablet (800 mg total) by mouth every 8 (eight) hours as needed for moderate pain. 10/04/18  Yes Paulette Blanch, MD  meloxicam (MOBIC) 15 MG tablet Take 15 mg by mouth daily.   Yes [provider]  methocarbamol (ROBAXIN) 500 MG tablet Take 500 mg by mouth every 8 (eight) hours as needed for muscle spasms.   Yes [provider]  oxyCODONE-acetaminophen (PERCOCET/ROXICET) 5-325 MG tablet Take 1 tablet by mouth every 4 (four) hours as needed for severe pain. 10/04/18  Yes Paulette Blanch, MD    Anti-infectives (From admission, onward)   Start     Dose/Rate Route Frequency Ordered Stop   10/16/18 1539  bacitracin 50,000 Units in sodium chloride 0.9 % 500 mL irrigation  Status:  Discontinued       As needed 10/16/18 1540 10/16/18 1558   10/16/18 1200  nafcillin 2 g in sodium chloride 0.9 % 100 mL IVPB     2 g 200 mL/hr over 30 Minutes Intravenous Every 4 hours 10/16/18 1101     10/16/18 1100  nafcillin injection 2 g  Status:  Discontinued     2 g Intravenous Every 4 hours 10/16/18 1057 10/16/18 1101   10/14/18 1900  ceFAZolin (ANCEF) IVPB 2g/100 mL premix  Status:  Discontinued     2 g 200 mL/hr over 30 Minutes Intravenous Every 8 hours 10/14/18 1601 10/16/18 1057   10/14/18 1800  vancomycin (VANCOCIN) IVPB 1000 mg/200 mL premix  Status:  Discontinued     1,000 mg 200 mL/hr over 60 Minutes Intravenous Every 24 hours 10/13/18 1645 10/14/18 0804   10/14/18 0900  ceFAZolin (ANCEF) IVPB 2g/100 mL premix  Status:  Discontinued     2 g 200 mL/hr over 30 Minutes Intravenous Every 8 hours 10/14/18 0804 10/14/18 1601   10/13/18 2200  ceFEPIme (MAXIPIME) 2 g in sodium  chloride 0.9 % 100 mL IVPB  Status:  Discontinued     2 g 200 mL/hr over 30 Minutes Intravenous Every 8 hours 10/13/18 1645 10/14/18 0804   10/13/18 1530  vancomycin (VANCOCIN) 1,500 mg in sodium chloride 0.9 % 500 mL IVPB     1,500 mg 250 mL/hr over 120 Minutes Intravenous  Once 10/13/18 1521 10/13/18 1949   10/13/18 1515  ceFEPIme (MAXIPIME) 2 g in sodium chloride 0.9 % 100 mL IVPB  2 g 200 mL/hr over 30 Minutes Intravenous  Once 10/13/18 1513 10/13/18 1617   10/13/18 1515  metroNIDAZOLE (FLAGYL) IVPB 500 mg     500 mg 100 mL/hr over 60 Minutes Intravenous  Once 10/13/18 1513 10/13/18 1724   10/13/18 1515  vancomycin (VANCOCIN) IVPB 1000 mg/200 mL premix  Status:  Discontinued     1,000 mg 200 mL/hr over 60 Minutes Intravenous  Once 10/13/18 1513 10/13/18 1521      Scheduled Meds: . buPROPion  300 mg Oral Daily  . diazepam  1 mg Oral BID  . docusate sodium  100 mg Oral BID  . escitalopram  30 mg Oral Daily  . fluticasone  1 spray Each Nare Daily  . ketorolac  15 mg Intravenous Q8H  . pantoprazole  40 mg Oral BID  . sodium chloride flush  10-40 mL Intracatheter Q12H   Continuous Infusions: . 0.9 % NaCl with KCl 20 mEq / L 75 mL/hr at 10/17/18 0503  . nafcillin IV 2 g (10/17/18 1414)   PRN Meds:.acetaminophen **OR** acetaminophen, ALPRAZolam, metoCLOPramide **OR** metoCLOPramide (REGLAN) injection, morphine injection, ondansetron **OR** ondansetron (ZOFRAN) IV, oxyCODONE, sodium chloride flush, ziprasidone  Allergies  Allergen Reactions  . Topiramate Hives    Physical Exam  Vitals  Blood pressure (!) 132/92, pulse 81, temperature 97.7 F (36.5 C), resp. rate 18, height 5\' 4"  (1.626 m), weight 80.7 kg, last menstrual period 01/22/2017, SpO2 92 %.  Lower Extremity exam:  Vascular: Palpable bilateral  Dermatological: Wound over the dorsum of the third toe DIP joint.  Looks much better at this point bed is basically granular there is a little bit of moist necrosis  centrally but that was white down and looks better overall.  Swelling and redness and drainage is much improved.  Neurological: Sensate to both feet  Ortho: Patient has some arthritis to the third toes bilaterally she does have a history of psoriasis and likely has psoriatic arthritis night sweats causing these types of issues.  X-rays were inconclusive as to whether there was any kind of bone involvement but basically after I reviewed and made it feel like that was the case.  Data Review  CBC Recent Labs  Lab 10/13/18 1419 10/14/18 0416 10/15/18 0906 10/16/18 0010  WBC 22.0* 18.9* 22.1* 24.3*  HGB 10.4* 9.9* 9.8* 8.5*  HCT 30.2* 29.9* 29.4* 25.5*  PLT 423* 391 504* 501*  MCV 90.1 92.6 93.3 93.1  MCH 31.0 30.7 31.1 31.0  MCHC 34.4 33.1 33.3 33.3  RDW 14.1 14.6 14.8 14.8   ------------------------------------------------------------------------------------------------------------------  Chemistries  Recent Labs  Lab 10/13/18 1419 10/14/18 0416 10/15/18 0906 10/16/18 0010  NA 127* 134* 131* 130*  K 2.8* 2.9* 3.9 3.7  CL 83* 93* 98 97*  CO2 31 29 25 25   GLUCOSE 159* 128* 108* 106*  BUN 28* 25* 15 11  CREATININE 1.01* 0.83 0.63 0.57  CALCIUM 7.8* 7.3* 7.5* 7.4*  MG 2.6*  --   --   --   AST 88* 59* 23 19  ALT 47* 42 25 18  ALKPHOS 759* 584* 458* 436*  BILITOT 3.8* 2.9* 1.6* 1.5*   ------------------------------------------------------------------------------------------------------------------ estimated creatinine clearance is 85.5 mL/min (by C-G formula based on SCr of 0.57 mg/dL). ------------------------------------------------------------------------------------------------------------------ No results for input(s): TSH, T4TOTAL, T3FREE, THYROIDAB in the last 72 hours.  Invalid input(s): FREET3 Urinalysis    Component Value Date/Time   COLORURINE AMBER (A) 10/13/2018 1440   APPEARANCEUR CLOUDY (A) 10/13/2018 [place] 06/02/2011 1040  LABSPEC  1.014 10/13/2018 1440   LABSPEC 1.005 06/02/2011 1040   PHURINE 6.0 10/13/2018 1440   GLUCOSEU NEGATIVE 10/13/2018 1440   GLUCOSEU NEGATIVE 06/02/2011 1040   HGBUR LARGE (A) 10/13/2018 1440   BILIRUBINUR SMALL (A) 10/13/2018 1440   BILIRUBINUR NEGATIVE 06/02/2011 1040   KETONESUR NEGATIVE 10/13/2018 1440   PROTEINUR 100 (A) 10/13/2018 1440   NITRITE NEGATIVE 10/13/2018 1440   LEUKOCYTESUR MODERATE (A) 10/13/2018 1440   LEUKOCYTESUR NEGATIVE 06/02/2011 1040     Imaging results:   Dg Chest 2 View  Result Date: 10/15/2018 CLINICAL DATA:  Initial evaluation for acute hypoxia, sepsis. EXAM: CHEST - 2 VIEW COMPARISON:  Prior radiograph from 10/13/2018. FINDINGS: Cardiac and mediastinal silhouettes are stable in size and contour, and remain within normal limits. Lungs mildly hypoinflated. Persistent small left pleural effusion. Associated left basilar opacity has mildly increased from previous, most worrisome for progressive infiltrate. Atelectatic changes may be contributory. No other focal airspace disease. No pulmonary edema. No pneumothorax. No acute osseous finding. IMPRESSION: Persistent small left pleural effusion with associated left basilar opacity, mildly worsened from previous, most worrisome for progressive infiltrate. Electronically Signed   By: Jeannine Boga M.D.   On: 10/15/2018 17:50   Mr Cervical Spine Wo Contrast  Result Date: 10/16/2018 CLINICAL DATA:  Launch template MR cervicothoracic EXAM: MRI CERVICAL AND THORACIC SPINE WITHOUT CONTRAST TECHNIQUE: Multiplanar and multiecho pulse sequences of the cervical and thoracic spine were obtained without intravenous contrast. COMPARISON:  None. FINDINGS: Examination is markedly degraded by motion MRI CERVICAL SPINE FINDINGS Alignment: Physiologic. Vertebrae: No fracture, evidence of discitis, or bone lesion. Cord: Normal signal and morphology. Posterior Fossa, vertebral arteries, paraspinal tissues: Negative. Disc levels: The  degree of motion severely limits assessment for subtle disc and foraminal pathology. There is no spinal canal stenosis. MRI THORACIC SPINE FINDINGS Alignment:  Physiologic. Vertebrae: No fracture, evidence of discitis, or bone lesion. Cord: At the T11-12 levels there is signal abnormality of the left ventral aspect of the spinal canal likely indicating an epidural collection (22:35, 23:37, 21:10). Paraspinal and other soft tissues: There are bilateral pleural effusions, left-greater-than-right. There is a large area of paravertebral phlegmon at the T9-11 levels. Disc levels: There is no disc signal abnormality. There is a intermediate size central disc protrusion at T8-9 effaces the ventral thecal sac without causing spinal canal stenosis. IMPRESSION: 1. Large paravertebral phlegmon at the T9-T11 levels with left ventral epidural collection, concerning for epidural abscess. Postcontrast imaging of the thoracic spine and pre and postcontrast imaging of the lumbar spine is recommended for further assessment. 2. No evidence of discitis-osteomyelitis despite the above. The epidural abnormality likely result from direct extension of the paravertebral collection. 3. Motion degraded cervical spine without acute abnormality. Electronically Signed   By: Ulyses Jarred M.D.   On: 10/16/2018 00:09   Mr Thoracic Spine Wo Contrast  Result Date: 10/16/2018 CLINICAL DATA:  Launch template MR cervicothoracic EXAM: MRI CERVICAL AND THORACIC SPINE WITHOUT CONTRAST TECHNIQUE: Multiplanar and multiecho pulse sequences of the cervical and thoracic spine were obtained without intravenous contrast. COMPARISON:  None. FINDINGS: Examination is markedly degraded by motion MRI CERVICAL SPINE FINDINGS Alignment: Physiologic. Vertebrae: No fracture, evidence of discitis, or bone lesion. Cord: Normal signal and morphology. Posterior Fossa, vertebral arteries, paraspinal tissues: Negative. Disc levels: The degree of motion severely limits  assessment for subtle disc and foraminal pathology. There is no spinal canal stenosis. MRI THORACIC SPINE FINDINGS Alignment:  Physiologic. Vertebrae: No fracture, evidence of discitis, or bone  lesion. Cord: At the T11-12 levels there is signal abnormality of the left ventral aspect of the spinal canal likely indicating an epidural collection (22:35, 23:37, 21:10). Paraspinal and other soft tissues: There are bilateral pleural effusions, left-greater-than-right. There is a large area of paravertebral phlegmon at the T9-11 levels. Disc levels: There is no disc signal abnormality. There is a intermediate size central disc protrusion at T8-9 effaces the ventral thecal sac without causing spinal canal stenosis. IMPRESSION: 1. Large paravertebral phlegmon at the T9-T11 levels with left ventral epidural collection, concerning for epidural abscess. Postcontrast imaging of the thoracic spine and pre and postcontrast imaging of the lumbar spine is recommended for further assessment. 2. No evidence of discitis-osteomyelitis despite the above. The epidural abnormality likely result from direct extension of the paravertebral collection. 3. Motion degraded cervical spine without acute abnormality. Electronically Signed   By: Ulyses Jarred M.D.   On: 10/16/2018 00:09   Mr Thoracic Spine W Contrast  Result Date: 10/16/2018 CLINICAL DATA:  Follow-up examination for epidural abscess, infection. EXAM: MRI THORACIC AND LUMBAR SPINE WITHOUT AND WITH CONTRAST TECHNIQUE: Multiplanar and multiecho pulse sequences of the thoracic and lumbar spine were obtained without and with intravenous contrast. CONTRAST:  7.5 cc of Gadavist. COMPARISON:  Prior precontrast MRI from 10/15/2018. FINDINGS: MRI THORACIC SPINE FINDINGS Alignment: Limited pre and postcontrast T1 weighted imaging of the thoracic spine was performed. Stable alignment with preservation of the normal thoracic kyphosis. No listhesis. Vertebrae: Vertebral body height maintained  without evidence for acute or interval fracture. Abnormal enhancement seen within the right posterior aspect of the T11 vertebral body extending into the right T11 pedicle (series 23, image 6). Associated marrow edema seen within this region on prior precontrast MRI. Enhancement extends into the adjacent right T10-11 facet. Finding likely reflects sequelae of osteomyelitis and/or septic arthritis, suspected to be source of infection. Adjacent T10-11 disc grossly maintained without evidence for significant discitis at this time. Additional abnormal enhancement seen about the left T3-4 facet (series 23, image 14), which could also reflect septic arthritis. Enhancement extends into the adjacent right T3 transverse process. Additional more mild enhancement seen about the right T6-7 facet, also suspicious for possible septic arthritis (series 23, image 7). Visualized bone marrow signal intensity otherwise within normal limits. No other definite evidence for osteomyelitis. Cord: Abnormal epidural enhancement with phlegmon and/or abscess seen involving the left ventral epidural space, extending from approximately the T10 level inferiorly through the upper lumbar spine (series 23, image 11). This measures up to approximately 7 mm in maximal diameter at the level of T12. No significant spinal stenosis at this time. Additional abnormal epidural enhancement seen within the dorsal epidural space at the level of T2 through T4 (series 23, image 12). No frank epidural collection. No other abnormal enhancement within the thoracic spinal cord. No other visible intracanalicular collections, although evaluation somewhat limited by motion artifact on axial sequences. Mild peridiscal enhancement noted about the T8-9 disc protrusion. Paraspinal and other soft tissues: Prominent paraspinous phlegmon and enhancement seen extending from T9 through T11, worse on the right. There is a heterogeneous paraspinous abscess at the level of T9-10  that measures 3.0 x 1.4 x 4.6 cm (AP by transverse by craniocaudad) (series 22, image 31). Probable few additional smaller loculated collections seen within the adjacent right paraspinous soft tissues. Mild soft tissue enhancement seen adjacent to the left T3-4 facet without frank collection. Bilateral pleural effusions with associated atelectasis and/or consolidation noted. Disc levels: T8-9: Central disc protrusion with  resultant mild spinal stenosis again noted. Long segment epidural collection extending from approximately the T10 level through the upper lumbar spine without significant spinal stenosis. MRI LUMBAR SPINE FINDINGS Segmentation: Standard. Lowest well-formed disc labeled the L5-S1 level. Alignment: Exaggeration of the normal lumbar lordosis without listhesis. Vertebrae: Vertebral body height maintained without evidence for acute or chronic fracture. Bone marrow signal intensity within normal limits. Subcentimeter benign hemangiomas noted within the L5 and S1 vertebral bodies. No worrisome osseous lesions. Abnormal marrow edema seen about the bilateral L4-5 facets, right worse than left, likely due to septic arthritis. No other definite evidence for osteomyelitis or discitis. Conus medullaris: Extends to the T12-L1 level and appears normal. Extensive abnormal epidural enhancement seen throughout the lumbar spinal canal, compatible with infection. There is a ventral epidural abscess extending from the visualized thoracic spine inferiorly to approximately the L4-5 level. This is maximal in diameter at the level of L1 through L4, measuring approximately 8 mm in greatest diameter. Extension into the right and posterior epidural space at the level of L4 (series 4, image 28). Paraspinal and other soft tissues: Abnormal enhancement and edema adjacent to the right greater than left L4-5 facets due to septic arthritis. Few irregular loculated soft tissue abscesses seen adjacent to the right L4-5 facet,  measuring 17 mm (series 7, image 27) and 24 mm (series 7, image 30). Adjacent diffuse edema and enhancement throughout the lower posterior paraspinous soft tissues, compatible with associated myositis. Small amount of free fluid noted within the pelvis. Visualized visceral structures grossly within normal limits. Disc levels: L1-2: Negative interspace. Small ventral epidural abscess with resultant mild spinal stenosis. Foramina remain patent. L2-3: Negative interspace. Prominent ventral epidural abscess measuring up to 8 mm in maximal diameter. Resultant severe spinal stenosis. Thecal sac is compressed and measures 4 mm in AP diameter at its most narrow point (series 4, image 23). Foramina remain patent. L3-4: Disc desiccation without disc bulge. Ventral epidural abscess with mild moderate facet hypertrophy. Joint effusion on the right, which could reflect early septic arthritis. Fairly severe spinal stenosis with the thecal sac compressed and measuring 4 mm in AP diameter at its most narrow point. L4-5: Negative interspace. Epidural phlegmon/abscess within the ventral epidural space, extending into the right lateral and posterior epidural space. Abscess extends into the right L4-5 foramen. Bilateral facet degeneration with findings concerning for septic arthritis. Adjacent paraspinous abscesses adjacent to the right L4-5 facet. Small joint effusions. Mild canal with bilateral subarticular stenosis. L5-S1: Negative interspace. Moderate facet degeneration. Epidural enhancement without significant collection. Foramina remain patent. IMPRESSION: MRI THORACIC SPINE IMPRESSION: 1. 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 Associated long segment ventral epidural phlegmon/abscess extending from T10 inferiorly through the upper lumbar spine, measuring 7 mm in maximal diameter at the level of T12. No significant  spinal stenosis at this time. 2. Associated right paraspinous abscess measuring up to 4.6 cm at the level of T9-10. 3. Abnormal enhancement about the left T3-4 and right T6-7 facets, also likely reflecting sequelae of septic arthritis. MRI LUMBAR SPINE IMPRESSION: 1. Long segment predominantly ventral epidural abscess extending from the thoracic spine to the level of L4-5, measuring 8 mm in maximal diameter. Resultant severe spinal stenosis at the levels of L2-3 and L3-4. 2. Abnormal edema and enhancement about the right greater than left L4-5 facets, like related to septic arthritis. Few small soft tissue abscesses adjacent to the right L4-5 facet measure  up to 2.4 cm. Electronically Signed   By: Jeannine Boga M.D.   On: 10/16/2018 23:59   Mr Lumbar Spine W Wo Contrast  Result Date: 10/16/2018 CLINICAL DATA:  Follow-up examination for epidural abscess, infection. EXAM: MRI THORACIC AND LUMBAR SPINE WITHOUT AND WITH CONTRAST TECHNIQUE: Multiplanar and multiecho pulse sequences of the thoracic and lumbar spine were obtained without and with intravenous contrast. CONTRAST:  7.5 cc of Gadavist. COMPARISON:  Prior precontrast MRI from 10/15/2018. FINDINGS: MRI THORACIC SPINE FINDINGS Alignment: Limited pre and postcontrast T1 weighted imaging of the thoracic spine was performed. Stable alignment with preservation of the normal thoracic kyphosis. No listhesis. Vertebrae: Vertebral body height maintained without evidence for acute or interval fracture. Abnormal enhancement seen within the right posterior aspect of the T11 vertebral body extending into the right T11 pedicle (series 23, image 6). Associated marrow edema seen within this region on prior precontrast MRI. Enhancement extends into the adjacent right T10-11 facet. Finding likely reflects sequelae of osteomyelitis and/or septic arthritis, suspected to be source of infection. Adjacent T10-11 disc grossly maintained without evidence for significant  discitis at this time. Additional abnormal enhancement seen about the left T3-4 facet (series 23, image 14), which could also reflect septic arthritis. Enhancement extends into the adjacent right T3 transverse process. Additional more mild enhancement seen about the right T6-7 facet, also suspicious for possible septic arthritis (series 23, image 7). Visualized bone marrow signal intensity otherwise within normal limits. No other definite evidence for osteomyelitis. Cord: Abnormal epidural enhancement with phlegmon and/or abscess seen involving the left ventral epidural space, extending from approximately the T10 level inferiorly through the upper lumbar spine (series 23, image 11). This measures up to approximately 7 mm in maximal diameter at the level of T12. No significant spinal stenosis at this time. Additional abnormal epidural enhancement seen within the dorsal epidural space at the level of T2 through T4 (series 23, image 12). No frank epidural collection. No other abnormal enhancement within the thoracic spinal cord. No other visible intracanalicular collections, although evaluation somewhat limited by motion artifact on axial sequences. Mild peridiscal enhancement noted about the T8-9 disc protrusion. Paraspinal and other soft tissues: Prominent paraspinous phlegmon and enhancement seen extending from T9 through T11, worse on the right. There is a heterogeneous paraspinous abscess at the level of T9-10 that measures 3.0 x 1.4 x 4.6 cm (AP by transverse by craniocaudad) (series 22, image 31). Probable few additional smaller loculated collections seen within the adjacent right paraspinous soft tissues. Mild soft tissue enhancement seen adjacent to the left T3-4 facet without frank collection. Bilateral pleural effusions with associated atelectasis and/or consolidation noted. Disc levels: T8-9: Central disc protrusion with resultant mild spinal stenosis again noted. Long segment epidural collection extending  from approximately the T10 level through the upper lumbar spine without significant spinal stenosis. MRI LUMBAR SPINE FINDINGS Segmentation: Standard. Lowest well-formed disc labeled the L5-S1 level. Alignment: Exaggeration of the normal lumbar lordosis without listhesis. Vertebrae: Vertebral body height maintained without evidence for acute or chronic fracture. Bone marrow signal intensity within normal limits. Subcentimeter benign hemangiomas noted within the L5 and S1 vertebral bodies. No worrisome osseous lesions. Abnormal marrow edema seen about the bilateral L4-5 facets, right worse than left, likely due to septic arthritis. No other definite evidence for osteomyelitis or discitis. Conus medullaris: Extends to the T12-L1 level and appears normal. Extensive abnormal epidural enhancement seen throughout the lumbar spinal canal, compatible with infection. There is a ventral epidural abscess extending from the  visualized thoracic spine inferiorly to approximately the L4-5 level. This is maximal in diameter at the level of L1 through L4, measuring approximately 8 mm in greatest diameter. Extension into the right and posterior epidural space at the level of L4 (series 4, image 28). Paraspinal and other soft tissues: Abnormal enhancement and edema adjacent to the right greater than left L4-5 facets due to septic arthritis. Few irregular loculated soft tissue abscesses seen adjacent to the right L4-5 facet, measuring 17 mm (series 7, image 27) and 24 mm (series 7, image 30). Adjacent diffuse edema and enhancement throughout the lower posterior paraspinous soft tissues, compatible with associated myositis. Small amount of free fluid noted within the pelvis. Visualized visceral structures grossly within normal limits. Disc levels: L1-2: Negative interspace. Small ventral epidural abscess with resultant mild spinal stenosis. Foramina remain patent. L2-3: Negative interspace. Prominent ventral epidural abscess measuring  up to 8 mm in maximal diameter. Resultant severe spinal stenosis. Thecal sac is compressed and measures 4 mm in AP diameter at its most narrow point (series 4, image 23). Foramina remain patent. L3-4: Disc desiccation without disc bulge. Ventral epidural abscess with mild moderate facet hypertrophy. Joint effusion on the right, which could reflect early septic arthritis. Fairly severe spinal stenosis with the thecal sac compressed and measuring 4 mm in AP diameter at its most narrow point. L4-5: Negative interspace. Epidural phlegmon/abscess within the ventral epidural space, extending into the right lateral and posterior epidural space. Abscess extends into the right L4-5 foramen. Bilateral facet degeneration with findings concerning for septic arthritis. Adjacent paraspinous abscesses adjacent to the right L4-5 facet. Small joint effusions. Mild canal with bilateral subarticular stenosis. L5-S1: Negative interspace. Moderate facet degeneration. Epidural enhancement without significant collection. Foramina remain patent. IMPRESSION: MRI THORACIC SPINE IMPRESSION: 1. 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 Associated long segment ventral epidural phlegmon/abscess extending from T10 inferiorly through the upper lumbar spine, measuring 7 mm in maximal diameter at the level of T12. No significant spinal stenosis at this time. 2. Associated right paraspinous abscess measuring up to 4.6 cm at the level of T9-10. 3. Abnormal enhancement about the left T3-4 and right T6-7 facets, also likely reflecting sequelae of septic arthritis. MRI LUMBAR SPINE IMPRESSION: 1. Long segment predominantly ventral epidural abscess extending from the thoracic spine to the level of L4-5, measuring 8 mm in maximal diameter. Resultant severe spinal stenosis at the levels of L2-3 and L3-4. 2. Abnormal edema and enhancement about the  right greater than left L4-5 facets, like related to septic arthritis. Few small soft tissue abscesses adjacent to the right L4-5 facet measure up to 2.4 cm. Electronically Signed   By: Jeannine Boga M.D.   On: 10/16/2018 23:59    Assessment & Plan: The third toe left foot continues to improve get look stable I would not anticipate having do surgery on his this timeframe.  Recommend continued wet-to-dry saline dressings with continued antibiotics.  I will follow my office next week.  Active Problems:   Sepsis [facility])   Family Communication: Plan discussed with patient   Albertine Patricia M.D on 10/17/2018 at 2:31 PM  Thank you for the consult, we will follow the patient with you in the Hospital.

## 2018-10-17 NOTE — Evaluation (Signed)
Physical Therapy Evaluation Patient Details Name: [name] MRN: [id number] DOB: Jun 25, 1965 Today's Date: 10/17/2018   History of Present Illness  53 y/o arrives with swollen L knee and toes, sepsis.  S/p I&Ds 6/24.    Clinical Impression  Pt was able to don a sock (R foot) and transition to EOB w/o assist but had hesitancy and unsteadiness with standing tasks.  She was able to ambulate ~30 ft with walker but was highly reliant on the walker, showed poor confidence and was hesitant to do a lot with L LE.  She reports that she normally is able to be out of the house essentially daily and does not need AD, etc.  Pt is very far from her baseline and would benefit from STR to work back toward PLOF.      Follow Up Recommendations SNF    Equipment Recommendations  Rolling walker with 5" wheels    Recommendations for Other Services       Precautions / Restrictions Precautions Precautions: Fall Restrictions Weight Bearing Restrictions: Yes LLE Weight Bearing: Weight bearing as tolerated      Mobility  Bed Mobility Overal bed mobility: Independent             General bed mobility comments: Pt slow to get to EOB, but able to do so w/o assist  Transfers Overall transfer level: Modified independent Equipment used: Rolling walker (2 wheeled) Transfers: Sit to/from Stand Sit to Stand: Min guard         General transfer comment: Pt at first hesistant to bear weight on L, did manage to rise with UE use and slightly raised bed.    Ambulation/Gait Ambulation/Gait assistance: Min guard Gait Distance (Feet): 30 Feet Assistive device: Rolling walker (2 wheeled)       General Gait Details: Pt with reliance on the walker and some hesitancy with R WBing.  She had general unsteadiness and some impulsivity but no overt LOBs.   Stairs            Wheelchair Mobility    Modified Rankin (Stroke Patients Only)       Balance Overall balance assessment: Needs  assistance Sitting-balance support: Bilateral upper extremity supported Sitting balance-Leahy Scale: Good     Standing balance support: Bilateral upper extremity supported Standing balance-Leahy Scale: Fair Standing balance comment: reliant on walker, poor tolerance                             Pertinent Vitals/Pain Pain Assessment: 0-10 Pain Score: 6  Pain Location: L knee     Home Living Family/patient expects to be discharged to:: Private residence Living Arrangements: Other relatives(sister, previous notes describe different living situation) Available Help at Discharge: Family   Home Access: Stairs to enter   Entrance Stairs-Number of Steps: 1   Home Equipment: None(states she may have grab bar in bathroom?)      Prior Function Level of Independence: Independent         Comments: Pt reports that she can be out of home ad lib w/o issue     Hand Dominance        Extremity/Trunk Assessment   Upper Extremity Assessment Upper Extremity Assessment: Overall WFL for tasks assessed;Generalized weakness    Lower Extremity Assessment Lower Extremity Assessment: Generalized weakness(limited L LE, knee ROM 2-40)       Communication   Communication: No difficulties  Cognition Arousal/Alertness: Awake/alert Behavior During Therapy: WFL for tasks assessed/performed  Overall Cognitive Status: Within Functional Limits for tasks assessed                                        General Comments      Exercises     Assessment/Plan    PT Assessment Patient needs continued PT services  PT Problem List Decreased strength;Decreased range of motion;Decreased activity tolerance;Decreased balance;Decreased mobility;Decreased coordination;Decreased cognition;Decreased knowledge of use of DME;Decreased safety awareness;Pain       PT Treatment Interventions Gait training;Stair training;Functional mobility training;DME instruction;Therapeutic  activities;Balance training;Therapeutic exercise;Neuromuscular re-education;Patient/family education    PT Goals (Current goals can be found in the Care Plan section)  Acute Rehab PT Goals Patient Stated Goal: Eat something PT Goal Formulation: With patient Time For Goal Achievement: 10/31/18 Potential to Achieve Goals: Fair    Frequency Min 2X/week   Barriers to discharge        Co-evaluation               AM-PAC PT "6 Clicks" Mobility  Outcome Measure Help needed turning from your back to your side while in a flat bed without using bedrails?: None Help needed moving from lying on your back to sitting on the side of a flat bed without using bedrails?: None Help needed moving to and from a bed to a chair (including a wheelchair)?: None Help needed standing up from a chair using your arms (e.g., wheelchair or bedside chair)?: A Little Help needed to walk in hospital room?: A Little Help needed climbing 3-5 steps with a railing? : A Lot 6 Click Score: 20    End of Session Equipment Utilized During Treatment: Gait belt Activity Tolerance: Patient limited by fatigue;Patient limited by pain Patient left: with chair alarm set;with call bell/phone within reach Nurse Communication: Mobility status PT Visit Diagnosis: Difficulty in walking, not elsewhere classified (R26.2);Muscle weakness (generalized) (M62.81)    Time: 5621-3086 PT Time Calculation (min) (ACUTE ONLY): 23 min   Charges:   PT Evaluation $PT Eval Low Complexity: 1 Low          Kreg Shropshire, DPT 10/17/2018, 1:31 PM

## 2018-10-18 ENCOUNTER — Inpatient Hospital Stay: Payer: Medicaid Other | Admitting: Registered Nurse

## 2018-10-18 ENCOUNTER — Encounter
Admission: EM | Disposition: A | Discharge status: Home / Self Care | Payer: Self-pay | Source: Home / Self Care | Attending: Internal Medicine

## 2018-10-18 ENCOUNTER — Inpatient Hospital Stay
Admit: 2018-10-18 | Discharge: 2018-10-18 | Disposition: A | Discharge status: Home / Self Care | Payer: Medicaid Other | Attending: Internal Medicine | Admitting: Internal Medicine

## 2018-10-18 ENCOUNTER — Inpatient Hospital Stay: Payer: Medicaid Other

## 2018-10-18 DIAGNOSIS — L089 Local infection of the skin and subcutaneous tissue, unspecified: Secondary | ICD-10-CM

## 2018-10-18 DIAGNOSIS — F1910 Other psychoactive substance abuse, uncomplicated: Secondary | ICD-10-CM | POA: Diagnosis present

## 2018-10-18 HISTORY — PX: TEE WITHOUT CARDIOVERSION: SHX5443

## 2018-10-18 SURGERY — ECHOCARDIOGRAM, TRANSESOPHAGEAL
Anesthesia: General

## 2018-10-18 MED ORDER — SODIUM CHLORIDE 0.9 % IV SOLN
INTRAVENOUS | Status: DC
  Administered 2018-10-18 – 2018-11-03 (×7): via INTRAVENOUS

## 2018-10-18 MED ORDER — LORAZEPAM 2 MG/ML IJ SOLN
2.0000 mg | Freq: Once | INTRAMUSCULAR | Status: AC
  Administered 2018-10-18: 03:00:00 2 mg via INTRAVENOUS
  Filled 2018-10-18: qty 1

## 2018-10-18 MED ORDER — MIDAZOLAM HCL 5 MG/5ML IJ SOLN
INTRAMUSCULAR | Status: AC
  Administered 2018-10-18: 10:00:00
  Filled 2018-10-18: qty 5

## 2018-10-18 MED ORDER — FENTANYL CITRATE (PF) 100 MCG/2ML IJ SOLN
INTRAMUSCULAR | Status: AC | PRN
  Administered 2018-10-18: 50 ug via INTRAVENOUS
  Administered 2018-10-18: 25 ug via INTRAVENOUS

## 2018-10-18 MED ORDER — MIDAZOLAM HCL 5 MG/5ML IJ SOLN
INTRAMUSCULAR | Status: AC | PRN
  Administered 2018-10-18 (×2): 1 mg via INTRAVENOUS

## 2018-10-18 MED ORDER — LIDOCAINE HCL (PF) 2 % IJ SOLN
INTRAMUSCULAR | Status: AC
  Filled 2018-10-18: qty 10

## 2018-10-18 MED ORDER — GLYCOPYRROLATE 0.2 MG/ML IJ SOLN
INTRAMUSCULAR | Status: AC
  Filled 2018-10-18: qty 1

## 2018-10-18 MED ORDER — LIDOCAINE VISCOUS HCL 2 % MT SOLN
OROMUCOSAL | Status: AC
  Administered 2018-10-18: 16:00:00
  Filled 2018-10-18: qty 15

## 2018-10-18 MED ORDER — ALPRAZOLAM 0.5 MG PO TABS
0.5000 mg | ORAL_TABLET | Freq: Three times a day (TID) | ORAL | Status: DC | PRN
  Administered 2018-10-18 – 2018-11-11 (×30): 0.5 mg via ORAL
  Filled 2018-10-18 (×31): qty 1

## 2018-10-18 MED ORDER — FENTANYL CITRATE (PF) 100 MCG/2ML IJ SOLN
INTRAMUSCULAR | Status: AC
  Administered 2018-10-18: 10:00:00
  Filled 2018-10-18: qty 2

## 2018-10-18 MED ORDER — MIDAZOLAM HCL 2 MG/2ML IJ SOLN
INTRAMUSCULAR | Status: AC
  Administered 2018-10-18: 09:00:00
  Filled 2018-10-18: qty 2

## 2018-10-18 MED ORDER — LIDOCAINE HCL (CARDIAC) PF 100 MG/5ML IV SOSY
PREFILLED_SYRINGE | INTRAVENOUS | Status: DC | PRN
  Administered 2018-10-18: 40 mg via INTRAVENOUS

## 2018-10-18 MED ORDER — FENTANYL CITRATE (PF) 100 MCG/2ML IJ SOLN
INTRAMUSCULAR | Status: DC | PRN
  Administered 2018-10-18: 25 ug via INTRAVENOUS

## 2018-10-18 MED ORDER — MIDAZOLAM HCL 2 MG/2ML IJ SOLN
INTRAMUSCULAR | Status: DC | PRN
  Administered 2018-10-18: 2 mg via INTRAVENOUS

## 2018-10-18 MED ORDER — PROPOFOL 10 MG/ML IV BOLUS
INTRAVENOUS | Status: AC
  Filled 2018-10-18: qty 20

## 2018-10-18 MED ORDER — PROPOFOL 10 MG/ML IV BOLUS
INTRAVENOUS | Status: DC | PRN
  Administered 2018-10-18: 20 mg via INTRAVENOUS
  Administered 2018-10-18: 10 mg via INTRAVENOUS
  Administered 2018-10-18: 30 mg via INTRAVENOUS
  Administered 2018-10-18: 10 mg via INTRAVENOUS
  Administered 2018-10-18 (×3): 20 mg via INTRAVENOUS
  Administered 2018-10-18: 10 mg via INTRAVENOUS
  Administered 2018-10-18: 30 mg via INTRAVENOUS

## 2018-10-18 MED ORDER — BUTAMBEN-TETRACAINE-BENZOCAINE 2-2-14 % EX AERO
INHALATION_SPRAY | CUTANEOUS | Status: AC
  Administered 2018-10-18: 09:00:00
  Filled 2018-10-18: qty 5

## 2018-10-18 MED ORDER — SODIUM CHLORIDE 0.9 % IV SOLN
INTRAVENOUS | Status: DC | PRN
  Administered 2018-10-18: 08:00:00 via INTRAVENOUS

## 2018-10-18 MED ORDER — FENTANYL CITRATE (PF) 100 MCG/2ML IJ SOLN
INTRAMUSCULAR | Status: AC
  Administered 2018-10-18: 10:00:00
  Filled 2018-10-18: qty 4

## 2018-10-18 NOTE — Progress Notes (Signed)
Date of Admission:  10/13/2018      ID:  Subjective: Pt went for IR drainage but could not lie still even after sedation and procedure was abandoned  Medications:   buPROPion  300 mg Oral Daily   butamben-tetracaine-benzocaine       diazepam  1 mg Oral BID   docusate sodium  100 mg Oral BID   escitalopram  30 mg Oral Daily   fentaNYL       fentaNYL       fluticasone  1 spray Each Nare Daily   ketorolac  15 mg Intravenous Q8H   lidocaine       midazolam       midazolam       pantoprazole  40 mg Oral BID   sodium chloride flush  10-40 mL Intracatheter Q12H    Objective: Vital signs in last 24 hours: Temp:  [98.1 F (36.7 C)-98.5 F (36.9 C)] 98.2 F (36.8 C) (06/26 0828) Pulse Rate:  [30-94] 91 (06/26 0946) Resp:  [2-39] 22 (06/26 0929) BP: (109-171)/(58-115) 135/93 (06/26 0946) SpO2:  [89 %-98 %] 95 % (06/26 0930) Weight:  [80.7 kg] 80.7 kg (06/26 0828)  PHYSICAL EXAM:  General:sleeping  Lab Results Recent Labs    10/16/18 0010  WBC 24.3*  HGB 8.5*  HCT 25.5*  NA 130*  K 3.7  CL 97*  CO2 25  BUN 11  CREATININE 0.57   Liver Panel Recent Labs    10/16/18 0010  PROT 5.2*  ALBUMIN 1.7*  AST 19  ALT 18  ALKPHOS 436*  BILITOT 1.5*   Sedimentation Rate No results for input(s): ESRSEDRATE in the last 72 hours. C-Reactive Protein No results for input(s): CRP in the last 72 hours.  Microbiology:    Studies/Results:   Ct Chest W Contrast  Result Date: 10/17/2018 CLINICAL DATA:  Epidural abscess. EXAM: CT CHEST WITH CONTRAST TECHNIQUE: Multidetector CT imaging of the chest was performed during intravenous contrast administration. CONTRAST:  29mL OMNIPAQUE IOHEXOL 300 MG/ML  SOLN COMPARISON:  None. FINDINGS: Cardiovascular: Heart size upper normal. No substantial pericardial effusion. No thoracic aortic aneurysm. Mediastinum/Nodes: No mediastinal lymphadenopathy. There is no hilar lymphadenopathy. There is no axillary lymphadenopathy.  The esophagus has normal imaging features. Lungs/Pleura: 5 mm right middle lobe pulmonary nodule visible on image 65/series 3. Bilateral lower lobe collapse/consolidation noted, left greater than right. Small bilateral pleural effusions evident. Upper Abdomen: Unremarkable. Musculoskeletal: No worrisome lytic or sclerotic osseous abnormality. As noted on recent MRI, a right paraspinal fluid collection is identified in the lower thoracic region, extending from approximately T9 down to T11 measuring about 1.2 x 4.0 x 4.7 cm. IMPRESSION: 1. Right paraspinal fluid collection extends from T9 down to the T11 level. This is not as well demonstrated by CT as it was on the recent MRI. 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 2. Bilateral lower lobe collapse/consolidation, left greater than right with small bilateral pleural effusions. 3. 5 mm right middle lobe pulmonary nodule. No follow-up needed if patient is low-risk. Non-contrast chest CT can be considered in 12 months if patient is high-risk. This recommendation follows the consensus statement: Guidelines for Management of Incidental Pulmonary Nodules Detected on CT Images: From the Fleischner Society 2017; Radiology 2017; 284:228-243. Electronically Signed   By: Misty Stanley M.D.   On: 10/17/2018 18:16   Mr Thoracic Spine W Contrast  Result Date: 10/16/2018  CLINICAL DATA:  Follow-up examination for epidural abscess, infection. EXAM: MRI THORACIC AND LUMBAR SPINE WITHOUT AND WITH CONTRAST TECHNIQUE: Multiplanar and multiecho pulse sequences of the thoracic and lumbar spine were obtained without and with intravenous contrast. CONTRAST:  7.5 cc of Gadavist. COMPARISON:  Prior precontrast MRI from 10/15/2018. FINDINGS: MRI THORACIC SPINE FINDINGS Alignment: Limited pre and postcontrast T1 weighted imaging of the thoracic spine was performed. Stable alignment with  preservation of the normal thoracic kyphosis. No listhesis. Vertebrae: Vertebral body height maintained without evidence for acute or interval fracture. Abnormal enhancement seen within the right posterior aspect of the T11 vertebral body extending into the right T11 pedicle (series 23, image 6). Associated marrow edema seen within this region on prior precontrast MRI. Enhancement extends into the adjacent right T10-11 facet. Finding likely reflects sequelae of osteomyelitis and/or septic arthritis, suspected to be source of infection. Adjacent T10-11 disc grossly maintained without evidence for significant discitis at this time. Additional abnormal enhancement seen about the left T3-4 facet (series 23, image 14), which could also reflect septic arthritis. Enhancement extends into the adjacent right T3 transverse process. Additional more mild enhancement seen about the right T6-7 facet, also suspicious for possible septic arthritis (series 23, image 7). Visualized bone marrow signal intensity otherwise within normal limits. No other definite evidence for osteomyelitis. Cord: Abnormal epidural enhancement with phlegmon and/or abscess seen involving the left ventral epidural space, extending from approximately the T10 level inferiorly through the upper lumbar spine (series 23, image 11). This measures up to approximately 7 mm in maximal diameter at the level of T12. No significant spinal stenosis at this time. Additional abnormal epidural enhancement seen within the dorsal epidural space at the level of T2 through T4 (series 23, image 12). No frank epidural collection. No other abnormal enhancement within the thoracic spinal cord. No other visible intracanalicular collections, although evaluation somewhat limited by motion artifact on axial sequences. Mild peridiscal enhancement noted about the T8-9 disc protrusion. Paraspinal and other soft tissues: Prominent paraspinous phlegmon and enhancement seen extending from  T9 through T11, worse on the right. There is a heterogeneous paraspinous abscess at the level of T9-10 that measures 3.0 x 1.4 x 4.6 cm (AP by transverse by craniocaudad) (series 22, image 31). Probable few additional smaller loculated collections seen within the adjacent right paraspinous soft tissues. Mild soft tissue enhancement seen adjacent to the left T3-4 facet without frank collection. Bilateral pleural effusions with associated atelectasis and/or consolidation noted. Disc levels: T8-9: Central disc protrusion with resultant mild spinal stenosis again noted. Long segment epidural collection extending from approximately the T10 level through the upper lumbar spine without significant spinal stenosis. MRI LUMBAR SPINE FINDINGS Segmentation: Standard. Lowest well-formed disc labeled the L5-S1 level. Alignment: Exaggeration of the normal lumbar lordosis without listhesis. Vertebrae: Vertebral body height maintained without evidence for acute or chronic fracture. Bone marrow signal intensity within normal limits. Subcentimeter benign hemangiomas noted within the L5 and S1 vertebral bodies. No worrisome osseous lesions. Abnormal marrow edema seen about the bilateral L4-5 facets, right worse than left, likely due to septic arthritis. No other definite evidence for osteomyelitis or discitis. Conus medullaris: Extends to the T12-L1 level and appears normal. Extensive abnormal epidural enhancement seen throughout the lumbar spinal canal, compatible with infection. There is a ventral epidural abscess extending from the visualized thoracic spine inferiorly to approximately the L4-5 level. This is maximal in diameter at the level of L1 through L4, measuring approximately 8 mm in greatest diameter. Extension into  the right and posterior epidural space at the level of L4 (series 4, image 28). Paraspinal and other soft tissues: Abnormal enhancement and edema adjacent to the right greater than left L4-5 facets due to septic  arthritis. Few irregular loculated soft tissue abscesses seen adjacent to the right L4-5 facet, measuring 17 mm (series 7, image 27) and 24 mm (series 7, image 30). Adjacent diffuse edema and enhancement throughout the lower posterior paraspinous soft tissues, compatible with associated myositis. Small amount of free fluid noted within the pelvis. Visualized visceral structures grossly within normal limits. Disc levels: L1-2: Negative interspace. Small ventral epidural abscess with resultant mild spinal stenosis. Foramina remain patent. L2-3: Negative interspace. Prominent ventral epidural abscess measuring up to 8 mm in maximal diameter. Resultant severe spinal stenosis. Thecal sac is compressed and measures 4 mm in AP diameter at its most narrow point (series 4, image 23). Foramina remain patent. L3-4: Disc desiccation without disc bulge. Ventral epidural abscess with mild moderate facet hypertrophy. Joint effusion on the right, which could reflect early septic arthritis. Fairly severe spinal stenosis with the thecal sac compressed and measuring 4 mm in AP diameter at its most narrow point. L4-5: Negative interspace. Epidural phlegmon/abscess within the ventral epidural space, extending into the right lateral and posterior epidural space. Abscess extends into the right L4-5 foramen. Bilateral facet degeneration with findings concerning for septic arthritis. Adjacent paraspinous abscesses adjacent to the right L4-5 facet. Small joint effusions. Mild canal with bilateral subarticular stenosis. L5-S1: Negative interspace. Moderate facet degeneration. Epidural enhancement without significant collection. Foramina remain patent. IMPRESSION: MRI THORACIC SPINE IMPRESSION: 1. 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 Associated long segment ventral epidural phlegmon/abscess extending from T10 inferiorly through the  upper lumbar spine, measuring 7 mm in maximal diameter at the level of T12. No significant spinal stenosis at this time. 2. Associated right paraspinous abscess measuring up to 4.6 cm at the level of T9-10. 3. Abnormal enhancement about the left T3-4 and right T6-7 facets, also likely reflecting sequelae of septic arthritis. MRI LUMBAR SPINE IMPRESSION: 1. Long segment predominantly ventral epidural abscess extending from the thoracic spine to the level of L4-5, measuring 8 mm in maximal diameter. Resultant severe spinal stenosis at the levels of L2-3 and L3-4. 2. Abnormal edema and enhancement about the right greater than left L4-5 facets, like related to septic arthritis. Few small soft tissue abscesses adjacent to the right L4-5 facet measure up to 2.4 cm. Electronically Signed   By: Jeannine Boga M.D.   On: 10/16/2018 23:59   Mr Lumbar Spine W Wo Contrast  Result Date: 10/16/2018 CLINICAL DATA:  Follow-up examination for epidural abscess, infection. EXAM: MRI THORACIC AND LUMBAR SPINE WITHOUT AND WITH CONTRAST TECHNIQUE: Multiplanar and multiecho pulse sequences of the thoracic and lumbar spine were obtained without and with intravenous contrast. CONTRAST:  7.5 cc of Gadavist. COMPARISON:  Prior precontrast MRI from 10/15/2018. FINDINGS: MRI THORACIC SPINE FINDINGS Alignment: Limited pre and postcontrast T1 weighted imaging of the thoracic spine was performed. Stable alignment with preservation of the normal thoracic kyphosis. No listhesis. Vertebrae: Vertebral body height maintained without evidence for acute or interval fracture. Abnormal enhancement seen within the right posterior aspect of the T11 vertebral body extending into the right T11 pedicle (series 23, image 6). Associated marrow edema seen within this region on prior precontrast MRI. Enhancement extends into the adjacent right T10-11 facet. Finding likely reflects sequelae  of osteomyelitis and/or septic arthritis, suspected to be source  of infection. Adjacent T10-11 disc grossly maintained without evidence for significant discitis at this time. Additional abnormal enhancement seen about the left T3-4 facet (series 23, image 14), which could also reflect septic arthritis. Enhancement extends into the adjacent right T3 transverse process. Additional more mild enhancement seen about the right T6-7 facet, also suspicious for possible septic arthritis (series 23, image 7). Visualized bone marrow signal intensity otherwise within normal limits. No other definite evidence for osteomyelitis. Cord: Abnormal epidural enhancement with phlegmon and/or abscess seen involving the left ventral epidural space, extending from approximately the T10 level inferiorly through the upper lumbar spine (series 23, image 11). This measures up to approximately 7 mm in maximal diameter at the level of T12. No significant spinal stenosis at this time. Additional abnormal epidural enhancement seen within the dorsal epidural space at the level of T2 through T4 (series 23, image 12). No frank epidural collection. No other abnormal enhancement within the thoracic spinal cord. No other visible intracanalicular collections, although evaluation somewhat limited by motion artifact on axial sequences. Mild peridiscal enhancement noted about the T8-9 disc protrusion. Paraspinal and other soft tissues: Prominent paraspinous phlegmon and enhancement seen extending from T9 through T11, worse on the right. There is a heterogeneous paraspinous abscess at the level of T9-10 that measures 3.0 x 1.4 x 4.6 cm (AP by transverse by craniocaudad) (series 22, image 31). Probable few additional smaller loculated collections seen within the adjacent right paraspinous soft tissues. Mild soft tissue enhancement seen adjacent to the left T3-4 facet without frank collection. Bilateral pleural effusions with associated atelectasis and/or consolidation noted. Disc levels: T8-9: Central disc protrusion with  resultant mild spinal stenosis again noted. Long segment epidural collection extending from approximately the T10 level through the upper lumbar spine without significant spinal stenosis. MRI LUMBAR SPINE FINDINGS Segmentation: Standard. Lowest well-formed disc labeled the L5-S1 level. Alignment: Exaggeration of the normal lumbar lordosis without listhesis. Vertebrae: Vertebral body height maintained without evidence for acute or chronic fracture. Bone marrow signal intensity within normal limits. Subcentimeter benign hemangiomas noted within the L5 and S1 vertebral bodies. No worrisome osseous lesions. Abnormal marrow edema seen about the bilateral L4-5 facets, right worse than left, likely due to septic arthritis. No other definite evidence for osteomyelitis or discitis. Conus medullaris: Extends to the T12-L1 level and appears normal. Extensive abnormal epidural enhancement seen throughout the lumbar spinal canal, compatible with infection. There is a ventral epidural abscess extending from the visualized thoracic spine inferiorly to approximately the L4-5 level. This is maximal in diameter at the level of L1 through L4, measuring approximately 8 mm in greatest diameter. Extension into the right and posterior epidural space at the level of L4 (series 4, image 28). Paraspinal and other soft tissues: Abnormal enhancement and edema adjacent to the right greater than left L4-5 facets due to septic arthritis. Few irregular loculated soft tissue abscesses seen adjacent to the right L4-5 facet, measuring 17 mm (series 7, image 27) and 24 mm (series 7, image 30). Adjacent diffuse edema and enhancement throughout the lower posterior paraspinous soft tissues, compatible with associated myositis. Small amount of free fluid noted within the pelvis. Visualized visceral structures grossly within normal limits. Disc levels: L1-2: Negative interspace. Small ventral epidural abscess with resultant mild spinal stenosis. Foramina  remain patent. L2-3: Negative interspace. Prominent ventral epidural abscess measuring up to 8 mm in maximal diameter. Resultant severe spinal stenosis. Thecal sac is compressed and measures  4 mm in AP diameter at its most narrow point (series 4, image 23). Foramina remain patent. L3-4: Disc desiccation without disc bulge. Ventral epidural abscess with mild moderate facet hypertrophy. Joint effusion on the right, which could reflect early septic arthritis. Fairly severe spinal stenosis with the thecal sac compressed and measuring 4 mm in AP diameter at its most narrow point. L4-5: Negative interspace. Epidural phlegmon/abscess within the ventral epidural space, extending into the right lateral and posterior epidural space. Abscess extends into the right L4-5 foramen. Bilateral facet degeneration with findings concerning for septic arthritis. Adjacent paraspinous abscesses adjacent to the right L4-5 facet. Small joint effusions. Mild canal with bilateral subarticular stenosis. L5-S1: Negative interspace. Moderate facet degeneration. Epidural enhancement without significant collection. Foramina remain patent. IMPRESSION: MRI THORACIC SPINE IMPRESSION: 1. 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 Associated long segment ventral epidural phlegmon/abscess extending from T10 inferiorly through the upper lumbar spine, measuring 7 mm in maximal diameter at the level of T12. No significant spinal stenosis at this time. 2. Associated right paraspinous abscess measuring up to 4.6 cm at the level of T9-10. 3. Abnormal enhancement about the left T3-4 and right T6-7 facets, also likely reflecting sequelae of septic arthritis. MRI LUMBAR SPINE IMPRESSION: 1. Long segment predominantly ventral epidural abscess extending from the thoracic spine to the level of L4-5, measuring 8 mm in maximal diameter. Resultant severe spinal  stenosis at the levels of L2-3 and L3-4. 2. Abnormal edema and enhancement about the right greater than left L4-5 facets, like related to septic arthritis. Few small soft tissue abscesses adjacent to the right L4-5 facet measure up to 2.4 cm. Electronically Signed   By: Jeannine Boga M.D.   On: 10/16/2018 23:59     Assessment/Plan: 53 year old female with history of substance use presents with left knee pain, left toe pain and purplish discoloration and also lethargy. Also has been having back pain especially neck pain for which she had 2 prior ED visits on 6/10 and 10/04/2018.  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 There is a heterogeneous paraspinous abscess at the level of T9-10 that measures 3.0 x 1.4 x 4.6 cm. Drain placement  could not be done by IR because of movt. This will need to be evacuated as she still has leucocytosis and we need to reduce the bioburden  Abnormal LFTs-HIV and hepatitis panel negative AST more than ALT.high alkaline phosphatase improving-ultrasound GB no stone  Discussed the management with Dr.Vacchani ID will follow her remotely this weekend.

## 2018-10-18 NOTE — Consult Note (Signed)
[facility] Cardiology Consultation Note  Patient ID: [name], MRN: [id number], DOB/AGE: 1966/01/22 53 y.o. Admit date: 10/13/2018   Date of Consult: 10/18/2018 Primary Physician: Marguerita Merles, MD Primary Cardiologist: None  Chief Complaint:  Chief Complaint  Patient presents with  . Altered Mental Status   Reason for Consult: Endocarditis  HPI: 53 y.o. female with drug abuse and recent issues of methicillin sensitive Staphylococcus aureus with significant issues with lower extremity infection and pain.  The patient has had fever and chills and other issues for which there has been concerns of other sources of bacteremia including endocarditis.  Patient now has undergone a transesophageal echocardiogram showing normal LV systolic function without evidence of endocarditis or other structural functional issues.  Past Medical History:  Diagnosis Date  . Anxiety   . Arthritis    PSORIATIC ARTHRITIS  . Back pain   . Colon polyp   . Depression   . Headache    MIGRAINE  . Ulcer       Surgical History:  Past Surgical History:  Procedure Laterality Date  . BREAST REDUCTION SURGERY    . COLONOSCOPY WITH ESOPHAGOGASTRODUODENOSCOPY (EGD)    . COLONOSCOPY WITH PROPOFOL N/A 05/19/2016   Procedure: COLONOSCOPY WITH PROPOFOL;  Surgeon: Lollie Sails, MD;  Location: [facility] ENDOSCOPY;  Service: Endoscopy;  Laterality: N/A;  . COLONOSCOPY WITH PROPOFOL N/A 03/06/2018   Procedure: COLONOSCOPY WITH PROPOFOL;  Surgeon: Toledo, Benay Pike, MD;  Location: [facility] ENDOSCOPY;  Service: Gastroenterology;  Laterality: N/A;  . ESOPHAGOGASTRODUODENOSCOPY (EGD) WITH PROPOFOL N/A 03/06/2018   Procedure: ESOPHAGOGASTRODUODENOSCOPY (EGD) WITH PROPOFOL;  Surgeon: Toledo, Benay Pike, MD;  Location: [facility] ENDOSCOPY;  Service: Gastroenterology;  Laterality: N/A;  . foot spurs    . FRACTURE SURGERY    . IRRIGATION AND DEBRIDEMENT KNEE Left 10/16/2018   Procedure: IRRIGATION AND DEBRIDEMENT KNEE ARTHROSCOPY;   Surgeon: Lovell Sheehan, MD;  Location: [facility] ORS;  Service: Orthopedics;  Laterality: Left;  Marland Kitchen MANDIBLE FRACTURE SURGERY    . TUBAL LIGATION       Home Meds: Prior to Admission medications   Medication Sig Start Date End Date Taking? Authorizing Provider  ALPRAZolam Duanne Moron) 1 MG tablet Take 1 mg by mouth 4 (four) times daily.   Yes [provider]  amphetamine-dextroamphetamine (ADDERALL) 30 MG tablet Take 30 mg by mouth 2 (two) times daily.    Yes [provider]  buPROPion (WELLBUTRIN SR) 200 MG 12 hr tablet TK 1 T PO  QAM AND QD AT NOON 08/13/18  Yes [provider]  clotrimazole-betamethasone (LOTRISONE) cream APP TO AFFECTED EAR ONCE OR TWICE WEEKLY PRN FOR SKIN IRRITATION. 11/09/16  Yes [provider]  diazepam (VALIUM) 5 MG tablet Take 1 tablet (5 mg total) by mouth every 8 (eight) hours as needed for anxiety. 10/04/18  Yes Paulette Blanch, MD  escitalopram (LEXAPRO) 20 MG tablet Take 20 mg by mouth daily.    Yes [provider]  FLUoxetine (PROZAC) 20 MG capsule TK 1 C PO QD 08/13/18  Yes [provider]  guanFACINE (TENEX) 2 MG tablet Take 2 mg by mouth daily. 09/08/18  Yes [provider]  ibuprofen (ADVIL) 800 MG tablet Take 1 tablet (800 mg total) by mouth every 8 (eight) hours as needed for moderate pain. 10/04/18  Yes Paulette Blanch, MD  meloxicam (MOBIC) 15 MG tablet Take 15 mg by mouth daily.   Yes [provider]  methocarbamol (ROBAXIN) 500 MG tablet Take 500 mg by  mouth every 8 (eight) hours as needed for muscle spasms.   Yes [provider]  oxyCODONE-acetaminophen (PERCOCET/ROXICET) 5-325 MG tablet Take 1 tablet by mouth every 4 (four) hours as needed for severe pain. 10/04/18  Yes Paulette Blanch, MD    Inpatient Medications:  . buPROPion  300 mg Oral Daily  . butamben-tetracaine-benzocaine      . diazepam  1 mg Oral BID  . docusate sodium  100 mg Oral BID  . escitalopram  30 mg Oral Daily  .  fentaNYL      . fentaNYL      . fluticasone  1 spray Each Nare Daily  . ketorolac  15 mg Intravenous Q8H  . lidocaine      . midazolam      . midazolam      . pantoprazole  40 mg Oral BID  . sodium chloride flush  10-40 mL Intracatheter Q12H   . sodium chloride 20 mL/hr at 10/18/18 0758  . Nafcillin 12g in sodium chloride 0.9% 544ml infusion 21 mL/hr at 10/17/18 1727    Allergies:  Allergies  Allergen Reactions  . Topiramate Hives    Social History   Socioeconomic History  . Marital status: Single    Spouse name: Not on file  . Number of children: Not on file  . Years of education: Not on file  . Highest education level: Not on file  Occupational History  . Not on file  Social Needs  . Financial resource strain: Not on file  . Food insecurity    Worry: Not on file    Inability: Not on file  . Transportation needs    Medical: Not on file    Non-medical: Not on file  Tobacco Use  . Smoking status: Former Smoker    Types: Cigarettes  . Smokeless tobacco: Never Used  Substance and Sexual Activity  . Alcohol use: Never    Frequency: Never  . Drug use: Yes    Types: "Crack" cocaine, Marijuana  . Sexual activity: Not on file  Lifestyle  . Physical activity    Days per week: Not on file    Minutes per session: Not on file  . Stress: Not on file  Relationships  . Social Herbalist on phone: Not on file    Gets together: Not on file    Attends religious service: Not on file    Active member of club or organization: Not on file    Attends meetings of clubs or organizations: Not on file    Relationship status: Not on file  . Intimate partner violence    Fear of current or ex partner: Not on file    Emotionally abused: Not on file    Physically abused: Not on file    Forced sexual activity: Not on file  Other Topics Concern  . Not on file  Social History Narrative  . Not on file     Family History  Problem Relation Age of Onset  . Liver cancer  Mother   . Liver disease Mother   . Cirrhosis Mother   . Alcohol abuse Mother   . Cancer Father   . Colon cancer Maternal Aunt      Review of Systems Positive for weakness fatigue Negative for: General:  chills, fever, night sweats or weight changes.  Cardiovascular: PND orthopnea syncope dizziness  Dermatological skin lesions rashes Respiratory: Cough congestion Urologic: Frequent urination urination at night and hematuria Abdominal: negative  for nausea, vomiting, diarrhea, bright red blood per rectum, melena, or hematemesis Neurologic: negative for visual changes, and/or hearing changes  All other systems reviewed and are otherwise negative except as noted above.  Labs: No results for input(s): CKTOTAL, CKMB, TROPONINI in the last 72 hours. Lab Results  Component Value Date   WBC 24.3 (H) 10/16/2018   HGB 8.5 (L) 10/16/2018   HCT 25.5 (L) 10/16/2018   MCV 93.1 10/16/2018   PLT 501 (H) 10/16/2018    Recent Labs  Lab 10/16/18 0010  NA 130*  K 3.7  CL 97*  CO2 25  BUN 11  CREATININE 0.57  CALCIUM 7.4*  PROT 5.2*  BILITOT 1.5*  ALKPHOS 436*  ALT 18  AST 19  GLUCOSE 106*   No results found for: CHOL, HDL, LDLCALC, TRIG No results found for: DDIMER  Radiology/Studies:  Dg Chest 2 View  Result Date: 10/15/2018 CLINICAL DATA:  Initial evaluation for acute hypoxia, sepsis. EXAM: CHEST - 2 VIEW COMPARISON:  Prior radiograph from 10/13/2018. FINDINGS: Cardiac and mediastinal silhouettes are stable in size and contour, and remain within normal limits. Lungs mildly hypoinflated. Persistent small left pleural effusion. Associated left basilar opacity has mildly increased from previous, most worrisome for progressive infiltrate. Atelectatic changes may be contributory. No other focal airspace disease. No pulmonary edema. No pneumothorax. No acute osseous finding. IMPRESSION: Persistent small left pleural effusion with associated left basilar opacity, mildly worsened from  previous, most worrisome for progressive infiltrate. Electronically Signed   By: Jeannine Boga M.D.   On: 10/15/2018 17:50   Ct Chest W Contrast  Result Date: 10/17/2018 CLINICAL DATA:  Epidural abscess. EXAM: CT CHEST WITH CONTRAST TECHNIQUE: Multidetector CT imaging of the chest was performed during intravenous contrast administration. CONTRAST:  60mL OMNIPAQUE IOHEXOL 300 MG/ML  SOLN COMPARISON:  None. FINDINGS: Cardiovascular: Heart size upper normal. No substantial pericardial effusion. No thoracic aortic aneurysm. Mediastinum/Nodes: No mediastinal lymphadenopathy. There is no hilar lymphadenopathy. There is no axillary lymphadenopathy. The esophagus has normal imaging features. Lungs/Pleura: 5 mm right middle lobe pulmonary nodule visible on image 65/series 3. Bilateral lower lobe collapse/consolidation noted, left greater than right. Small bilateral pleural effusions evident. Upper Abdomen: Unremarkable. Musculoskeletal: No worrisome lytic or sclerotic osseous abnormality. As noted on recent MRI, a right paraspinal fluid collection is identified in the lower thoracic region, extending from approximately T9 down to T11 measuring about 1.2 x 4.0 x 4.7 cm. IMPRESSION: 1. Right paraspinal fluid collection extends from T9 down to the T11 level. This is not as well demonstrated by CT as it was on the recent MRI. 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 2. Bilateral lower lobe collapse/consolidation, left greater than right with small bilateral pleural effusions. 3. 5 mm right middle lobe pulmonary nodule. No follow-up needed if patient is low-risk. Non-contrast chest CT can be considered in 12 months if patient is high-risk. This recommendation follows the consensus statement: Guidelines for Management of Incidental Pulmonary Nodules Detected on CT Images: From the Fleischner Society 2017; Radiology  2017; 284:228-243. Electronically Signed   By: Misty Stanley M.D.   On: 10/17/2018 18:16   Mr Cervical Spine Wo Contrast  Result Date: 10/16/2018 CLINICAL DATA:  Launch template MR cervicothoracic EXAM: MRI CERVICAL AND THORACIC SPINE WITHOUT CONTRAST TECHNIQUE: Multiplanar and multiecho pulse sequences of the cervical and thoracic spine were obtained without intravenous contrast. COMPARISON:  None. FINDINGS: Examination is  markedly degraded by motion MRI CERVICAL SPINE FINDINGS Alignment: Physiologic. Vertebrae: No fracture, evidence of discitis, or bone lesion. Cord: Normal signal and morphology. Posterior Fossa, vertebral arteries, paraspinal tissues: Negative. Disc levels: The degree of motion severely limits assessment for subtle disc and foraminal pathology. There is no spinal canal stenosis. MRI THORACIC SPINE FINDINGS Alignment:  Physiologic. Vertebrae: No fracture, evidence of discitis, or bone lesion. Cord: At the T11-12 levels there is signal abnormality of the left ventral aspect of the spinal canal likely indicating an epidural collection (22:35, 23:37, 21:10). Paraspinal and other soft tissues: There are bilateral pleural effusions, left-greater-than-right. There is a large area of paravertebral phlegmon at the T9-11 levels. Disc levels: There is no disc signal abnormality. There is a intermediate size central disc protrusion at T8-9 effaces the ventral thecal sac without causing spinal canal stenosis. IMPRESSION: 1. Large paravertebral phlegmon at the T9-T11 levels with left ventral epidural collection, concerning for epidural abscess. Postcontrast imaging of the thoracic spine and pre and postcontrast imaging of the lumbar spine is recommended for further assessment. 2. No evidence of discitis-osteomyelitis despite the above. The epidural abnormality likely result from direct extension of the paravertebral collection. 3. Motion degraded cervical spine without acute abnormality. Electronically  Signed   By: Ulyses Jarred M.D.   On: 10/16/2018 00:09   Mr Thoracic Spine Wo Contrast  Result Date: 10/16/2018 CLINICAL DATA:  Launch template MR cervicothoracic EXAM: MRI CERVICAL AND THORACIC SPINE WITHOUT CONTRAST TECHNIQUE: Multiplanar and multiecho pulse sequences of the cervical and thoracic spine were obtained without intravenous contrast. COMPARISON:  None. FINDINGS: Examination is markedly degraded by motion MRI CERVICAL SPINE FINDINGS Alignment: Physiologic. Vertebrae: No fracture, evidence of discitis, or bone lesion. Cord: Normal signal and morphology. Posterior Fossa, vertebral arteries, paraspinal tissues: Negative. Disc levels: The degree of motion severely limits assessment for subtle disc and foraminal pathology. There is no spinal canal stenosis. MRI THORACIC SPINE FINDINGS Alignment:  Physiologic. Vertebrae: No fracture, evidence of discitis, or bone lesion. Cord: At the T11-12 levels there is signal abnormality of the left ventral aspect of the spinal canal likely indicating an epidural collection (22:35, 23:37, 21:10). Paraspinal and other soft tissues: There are bilateral pleural effusions, left-greater-than-right. There is a large area of paravertebral phlegmon at the T9-11 levels. Disc levels: There is no disc signal abnormality. There is a intermediate size central disc protrusion at T8-9 effaces the ventral thecal sac without causing spinal canal stenosis. IMPRESSION: 1. Large paravertebral phlegmon at the T9-T11 levels with left ventral epidural collection, concerning for epidural abscess. Postcontrast imaging of the thoracic spine and pre and postcontrast imaging of the lumbar spine is recommended for further assessment. 2. No evidence of discitis-osteomyelitis despite the above. The epidural abnormality likely result from direct extension of the paravertebral collection. 3. Motion degraded cervical spine without acute abnormality. Electronically Signed   By: Ulyses Jarred M.D.   On:  10/16/2018 00:09   Mr Thoracic Spine W Contrast  Result Date: 10/16/2018 CLINICAL DATA:  Follow-up examination for epidural abscess, infection. EXAM: MRI THORACIC AND LUMBAR SPINE WITHOUT AND WITH CONTRAST TECHNIQUE: Multiplanar and multiecho pulse sequences of the thoracic and lumbar spine were obtained without and with intravenous contrast. CONTRAST:  7.5 cc of Gadavist. COMPARISON:  Prior precontrast MRI from 10/15/2018. FINDINGS: MRI THORACIC SPINE FINDINGS Alignment: Limited pre and postcontrast T1 weighted imaging of the thoracic spine was performed. Stable alignment with preservation of the normal thoracic kyphosis. No listhesis. Vertebrae: Vertebral body height maintained without evidence  for acute or interval fracture. Abnormal enhancement seen within the right posterior aspect of the T11 vertebral body extending into the right T11 pedicle (series 23, image 6). Associated marrow edema seen within this region on prior precontrast MRI. Enhancement extends into the adjacent right T10-11 facet. Finding likely reflects sequelae of osteomyelitis and/or septic arthritis, suspected to be source of infection. Adjacent T10-11 disc grossly maintained without evidence for significant discitis at this time. Additional abnormal enhancement seen about the left T3-4 facet (series 23, image 14), which could also reflect septic arthritis. Enhancement extends into the adjacent right T3 transverse process. Additional more mild enhancement seen about the right T6-7 facet, also suspicious for possible septic arthritis (series 23, image 7). Visualized bone marrow signal intensity otherwise within normal limits. No other definite evidence for osteomyelitis. Cord: Abnormal epidural enhancement with phlegmon and/or abscess seen involving the left ventral epidural space, extending from approximately the T10 level inferiorly through the upper lumbar spine (series 23, image 11). This measures up to approximately 7 mm in maximal  diameter at the level of T12. No significant spinal stenosis at this time. Additional abnormal epidural enhancement seen within the dorsal epidural space at the level of T2 through T4 (series 23, image 12). No frank epidural collection. No other abnormal enhancement within the thoracic spinal cord. No other visible intracanalicular collections, although evaluation somewhat limited by motion artifact on axial sequences. Mild peridiscal enhancement noted about the T8-9 disc protrusion. Paraspinal and other soft tissues: Prominent paraspinous phlegmon and enhancement seen extending from T9 through T11, worse on the right. There is a heterogeneous paraspinous abscess at the level of T9-10 that measures 3.0 x 1.4 x 4.6 cm (AP by transverse by craniocaudad) (series 22, image 31). Probable few additional smaller loculated collections seen within the adjacent right paraspinous soft tissues. Mild soft tissue enhancement seen adjacent to the left T3-4 facet without frank collection. Bilateral pleural effusions with associated atelectasis and/or consolidation noted. Disc levels: T8-9: Central disc protrusion with resultant mild spinal stenosis again noted. Long segment epidural collection extending from approximately the T10 level through the upper lumbar spine without significant spinal stenosis. MRI LUMBAR SPINE FINDINGS Segmentation: Standard. Lowest well-formed disc labeled the L5-S1 level. Alignment: Exaggeration of the normal lumbar lordosis without listhesis. Vertebrae: Vertebral body height maintained without evidence for acute or chronic fracture. Bone marrow signal intensity within normal limits. Subcentimeter benign hemangiomas noted within the L5 and S1 vertebral bodies. No worrisome osseous lesions. Abnormal marrow edema seen about the bilateral L4-5 facets, right worse than left, likely due to septic arthritis. No other definite evidence for osteomyelitis or discitis. Conus medullaris: Extends to the T12-L1 level  and appears normal. Extensive abnormal epidural enhancement seen throughout the lumbar spinal canal, compatible with infection. There is a ventral epidural abscess extending from the visualized thoracic spine inferiorly to approximately the L4-5 level. This is maximal in diameter at the level of L1 through L4, measuring approximately 8 mm in greatest diameter. Extension into the right and posterior epidural space at the level of L4 (series 4, image 28). Paraspinal and other soft tissues: Abnormal enhancement and edema adjacent to the right greater than left L4-5 facets due to septic arthritis. Few irregular loculated soft tissue abscesses seen adjacent to the right L4-5 facet, measuring 17 mm (series 7, image 27) and 24 mm (series 7, image 30). Adjacent diffuse edema and enhancement throughout the lower posterior paraspinous soft tissues, compatible with associated myositis. Small amount of free fluid noted within the  pelvis. Visualized visceral structures grossly within normal limits. Disc levels: L1-2: Negative interspace. Small ventral epidural abscess with resultant mild spinal stenosis. Foramina remain patent. L2-3: Negative interspace. Prominent ventral epidural abscess measuring up to 8 mm in maximal diameter. Resultant severe spinal stenosis. Thecal sac is compressed and measures 4 mm in AP diameter at its most narrow point (series 4, image 23). Foramina remain patent. L3-4: Disc desiccation without disc bulge. Ventral epidural abscess with mild moderate facet hypertrophy. Joint effusion on the right, which could reflect early septic arthritis. Fairly severe spinal stenosis with the thecal sac compressed and measuring 4 mm in AP diameter at its most narrow point. L4-5: Negative interspace. Epidural phlegmon/abscess within the ventral epidural space, extending into the right lateral and posterior epidural space. Abscess extends into the right L4-5 foramen. Bilateral facet degeneration with findings concerning  for septic arthritis. Adjacent paraspinous abscesses adjacent to the right L4-5 facet. Small joint effusions. Mild canal with bilateral subarticular stenosis. L5-S1: Negative interspace. Moderate facet degeneration. Epidural enhancement without significant collection. Foramina remain patent. IMPRESSION: MRI THORACIC SPINE IMPRESSION: 1. 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 Associated long segment ventral epidural phlegmon/abscess extending from T10 inferiorly through the upper lumbar spine, measuring 7 mm in maximal diameter at the level of T12. No significant spinal stenosis at this time. 2. Associated right paraspinous abscess measuring up to 4.6 cm at the level of T9-10. 3. Abnormal enhancement about the left T3-4 and right T6-7 facets, also likely reflecting sequelae of septic arthritis. MRI LUMBAR SPINE IMPRESSION: 1. Long segment predominantly ventral epidural abscess extending from the thoracic spine to the level of L4-5, measuring 8 mm in maximal diameter. Resultant severe spinal stenosis at the levels of L2-3 and L3-4. 2. Abnormal edema and enhancement about the right greater than left L4-5 facets, like related to septic arthritis. Few small soft tissue abscesses adjacent to the right L4-5 facet measure up to 2.4 cm. Electronically Signed   By: Jeannine Boga M.D.   On: 10/16/2018 23:59   Mr Lumbar Spine W Wo Contrast  Result Date: 10/16/2018 CLINICAL DATA:  Follow-up examination for epidural abscess, infection. EXAM: MRI THORACIC AND LUMBAR SPINE WITHOUT AND WITH CONTRAST TECHNIQUE: Multiplanar and multiecho pulse sequences of the thoracic and lumbar spine were obtained without and with intravenous contrast. CONTRAST:  7.5 cc of Gadavist. COMPARISON:  Prior precontrast MRI from 10/15/2018. FINDINGS: MRI THORACIC SPINE FINDINGS Alignment: Limited pre and postcontrast T1 weighted  imaging of the thoracic spine was performed. Stable alignment with preservation of the normal thoracic kyphosis. No listhesis. Vertebrae: Vertebral body height maintained without evidence for acute or interval fracture. Abnormal enhancement seen within the right posterior aspect of the T11 vertebral body extending into the right T11 pedicle (series 23, image 6). Associated marrow edema seen within this region on prior precontrast MRI. Enhancement extends into the adjacent right T10-11 facet. Finding likely reflects sequelae of osteomyelitis and/or septic arthritis, suspected to be source of infection. Adjacent T10-11 disc grossly maintained without evidence for significant discitis at this time. Additional abnormal enhancement seen about the left T3-4 facet (series 23, image 14), which could also reflect septic arthritis. Enhancement extends into the adjacent right T3 transverse process. Additional more mild enhancement seen about the right T6-7 facet, also suspicious for possible septic arthritis (series 23, image 7). Visualized bone marrow signal intensity otherwise within normal limits. No other definite evidence for osteomyelitis. Cord:  Abnormal epidural enhancement with phlegmon and/or abscess seen involving the left ventral epidural space, extending from approximately the T10 level inferiorly through the upper lumbar spine (series 23, image 11). This measures up to approximately 7 mm in maximal diameter at the level of T12. No significant spinal stenosis at this time. Additional abnormal epidural enhancement seen within the dorsal epidural space at the level of T2 through T4 (series 23, image 12). No frank epidural collection. No other abnormal enhancement within the thoracic spinal cord. No other visible intracanalicular collections, although evaluation somewhat limited by motion artifact on axial sequences. Mild peridiscal enhancement noted about the T8-9 disc protrusion. Paraspinal and other soft tissues:  Prominent paraspinous phlegmon and enhancement seen extending from T9 through T11, worse on the right. There is a heterogeneous paraspinous abscess at the level of T9-10 that measures 3.0 x 1.4 x 4.6 cm (AP by transverse by craniocaudad) (series 22, image 31). Probable few additional smaller loculated collections seen within the adjacent right paraspinous soft tissues. Mild soft tissue enhancement seen adjacent to the left T3-4 facet without frank collection. Bilateral pleural effusions with associated atelectasis and/or consolidation noted. Disc levels: T8-9: Central disc protrusion with resultant mild spinal stenosis again noted. Long segment epidural collection extending from approximately the T10 level through the upper lumbar spine without significant spinal stenosis. MRI LUMBAR SPINE FINDINGS Segmentation: Standard. Lowest well-formed disc labeled the L5-S1 level. Alignment: Exaggeration of the normal lumbar lordosis without listhesis. Vertebrae: Vertebral body height maintained without evidence for acute or chronic fracture. Bone marrow signal intensity within normal limits. Subcentimeter benign hemangiomas noted within the L5 and S1 vertebral bodies. No worrisome osseous lesions. Abnormal marrow edema seen about the bilateral L4-5 facets, right worse than left, likely due to septic arthritis. No other definite evidence for osteomyelitis or discitis. Conus medullaris: Extends to the T12-L1 level and appears normal. Extensive abnormal epidural enhancement seen throughout the lumbar spinal canal, compatible with infection. There is a ventral epidural abscess extending from the visualized thoracic spine inferiorly to approximately the L4-5 level. This is maximal in diameter at the level of L1 through L4, measuring approximately 8 mm in greatest diameter. Extension into the right and posterior epidural space at the level of L4 (series 4, image 28). Paraspinal and other soft tissues: Abnormal enhancement and edema  adjacent to the right greater than left L4-5 facets due to septic arthritis. Few irregular loculated soft tissue abscesses seen adjacent to the right L4-5 facet, measuring 17 mm (series 7, image 27) and 24 mm (series 7, image 30). Adjacent diffuse edema and enhancement throughout the lower posterior paraspinous soft tissues, compatible with associated myositis. Small amount of free fluid noted within the pelvis. Visualized visceral structures grossly within normal limits. Disc levels: L1-2: Negative interspace. Small ventral epidural abscess with resultant mild spinal stenosis. Foramina remain patent. L2-3: Negative interspace. Prominent ventral epidural abscess measuring up to 8 mm in maximal diameter. Resultant severe spinal stenosis. Thecal sac is compressed and measures 4 mm in AP diameter at its most narrow point (series 4, image 23). Foramina remain patent. L3-4: Disc desiccation without disc bulge. Ventral epidural abscess with mild moderate facet hypertrophy. Joint effusion on the right, which could reflect early septic arthritis. Fairly severe spinal stenosis with the thecal sac compressed and measuring 4 mm in AP diameter at its most narrow point. L4-5: Negative interspace. Epidural phlegmon/abscess within the ventral epidural space, extending into the right lateral and posterior epidural space. Abscess extends into the right L4-5 foramen.  Bilateral facet degeneration with findings concerning for septic arthritis. Adjacent paraspinous abscesses adjacent to the right L4-5 facet. Small joint effusions. Mild canal with bilateral subarticular stenosis. L5-S1: Negative interspace. Moderate facet degeneration. Epidural enhancement without significant collection. Foramina remain patent. IMPRESSION: MRI THORACIC SPINE IMPRESSION: 1. 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 Associated long segment ventral  epidural phlegmon/abscess extending from T10 inferiorly through the upper lumbar spine, measuring 7 mm in maximal diameter at the level of T12. No significant spinal stenosis at this time. 2. Associated right paraspinous abscess measuring up to 4.6 cm at the level of T9-10. 3. Abnormal enhancement about the left T3-4 and right T6-7 facets, also likely reflecting sequelae of septic arthritis. MRI LUMBAR SPINE IMPRESSION: 1. Long segment predominantly ventral epidural abscess extending from the thoracic spine to the level of L4-5, measuring 8 mm in maximal diameter. Resultant severe spinal stenosis at the levels of L2-3 and L3-4. 2. Abnormal edema and enhancement about the right greater than left L4-5 facets, like related to septic arthritis. Few small soft tissue abscesses adjacent to the right L4-5 facet measure up to 2.4 cm. Electronically Signed   By: Jeannine Boga M.D.   On: 10/16/2018 23:59   Ct Chest Limited Wo Contrast  Result Date: 10/18/2018 INDICATION: History of right-sided paraspinal fluid collection at the T9-T11 level. Please perform CT-guided aspiration for identification of infectious agent. EXAM: CT CHEST LIMITED WITHOUT CONTRAST COMPARISON:  Chest CT-10/17/2018; thoracic spine MRI-10/16/2018 MEDICATIONS: The patient is currently admitted to the hospital and receiving intravenous antibiotics. The antibiotics were administered within an appropriate time frame prior to the initiation of the procedure. ANESTHESIA/SEDATION: Moderate (conscious) sedation was employed during this procedure. A total of Versed 2 mg and Fentanyl 75 mcg was administered intravenously. Moderate Sedation Time: 15 minutes. The patient's level of consciousness and vital signs were monitored continuously by radiology nursing throughout the procedure under my direct supervision. CONTRAST:  None COMPLICATIONS: None immediate. PROCEDURE: Informed written consent was obtained from the patient's sister after a discussion of  the risks, benefits and alternatives to treatment. The patient was placed prone on the CT gantry and a pre procedural CT was performed re-demonstrating the known abscess/fluid collection within the right paraspinal space adjacent to the T9-T11 vertebral bodies with dominant ill-defined collection measuring approximately 4.0 x 1.5 cm (image 21, series 2). The procedure was planned. A timeout was performed prior to the initiation of the procedure. Despite the administration of conscious sedation medicines, the patient was unable to maintain positioning to allow for safe CT-guided paraspinal fluid collection aspiration. As such, no procedure was attempted. IMPRESSION: No attempt at CT-guided aspiration of ill-defined right-sided paraspinal fluid collection secondary to patient's inability to tolerate positioning on the table and/or cooperate with instructions to remain still despite the administration of conscious sedation. Above findings discussed with Dr. Anselm Jungling at the time of procedure cancellation. Electronically Signed   By: Sandi Mariscal M.D.   On: 10/18/2018 12:50   Dg Chest Portable 1 View  Result Date: 10/13/2018 CLINICAL DATA:  Lethargy and slurred speech. Recent crack cocaine use. EXAM: PORTABLE CHEST 1 VIEW COMPARISON:  None. FINDINGS: Lungs are hypoinflated demonstrate mild opacification over the left base which may be due to small effusion with atelectasis versus infection. Mild prominence of the right perihilar markings. Cardiomediastinal silhouette is within normal. Mild degenerative change of the spine. IMPRESSION: Left base opacification which may be due to  small effusion with atelectasis versus infection. Electronically Signed   By: Marin Olp M.D.   On: 10/13/2018 14:53   Dg Knee Complete 4 Views Left  Result Date: 10/13/2018 CLINICAL DATA:  Knee EXAM: LEFT KNEE - COMPLETE 4+ VIEW COMPARISON:  None. FINDINGS: No fracture or malalignment. Minimal degenerative of the medial joint space.  Trace knee effusion. IMPRESSION: No acute osseous abnormality.  Trace knee effusion Electronically Signed   By: Donavan Foil M.D.   On: 10/13/2018 14:58   Dg Foot Complete Left  Addendum Date: 10/13/2018   ADDENDUM REPORT: 10/13/2018 17:02 ADDENDUM: Request made to common specifically on third digit. Evaluation of third digit limited due to flexed positioning and bony overlap. Questionable lucency at the base of the third distal phalanx on one view which may represent subtle fracture versus small erosion. Electronically Signed   By: Donavan Foil M.D.   On: 10/13/2018 17:02   Result Date: 10/13/2018 CLINICAL DATA:  Foot pain EXAM: LEFT FOOT - COMPLETE 3+ VIEW COMPARISON:  11/28/2017 FINDINGS: No fracture or malalignment. Mild degenerative change at the first MTP joint. Small plantar calcaneal spur. IMPRESSION: No acute osseous abnormality Electronically Signed: By: Donavan Foil M.D. On: 10/13/2018 14:59   US Abdomen Limited Ruq  Result Date: 10/13/2018 CLINICAL DATA:  Elevated liver function test.  Sepsis. EXAM: ULTRASOUND ABDOMEN LIMITED RIGHT UPPER QUADRANT COMPARISON:  None. FINDINGS: Gallbladder: Contracted gallbladder with borderline wall thickening of 3.4 mm. No gallstones or sludge seen. No sonographic Murphy sign noted by sonographer. Common bile duct: Diameter: 4.6 mm Liver: No focal lesion identified. Within normal limits in parenchymal echogenicity. Portal vein is patent on color Doppler imaging with normal direction of blood flow towards the liver. IMPRESSION: Contracted gallbladder with borderline wall thickening, which may be artifactual. Negative sonographic Murphy's sign. Normal appearance of the liver. Electronically Signed   By: Fidela Salisbury M.D.   On: 10/13/2018 16:29    EKG: Normal sinus rhythm  Weights: Filed Weights   10/13/18 1416 10/16/18 1401 10/18/18 0828  Weight: 80.7 kg 80.7 kg 80.7 kg     Physical Exam: Blood pressure (!) 154/94, pulse 77, temperature 98.2  F (36.8 C), resp. rate 11, height 5\' 4"  (1.626 m), weight 80.7 kg, last menstrual period 01/22/2017, SpO2 97 %. Body mass index is 30.54 kg/m. General: Well developed, well nourished, in no acute distress. Head eyes ears nose throat: Normocephalic, atraumatic, sclera non-icteric, no xanthomas, nares are without discharge. No apparent thyromegaly and/or mass  Lungs: Normal respiratory effort.  no wheezes, no rales, no rhonchi.  Heart: RRR with normal S1 S2. no murmur gallop, no rub, PMI is normal size and placement, carotid upstroke normal without bruit, jugular venous pressure is normal Abdomen: Soft, non-tender, non-distended with normoactive bowel sounds. No hepatomegaly. No rebound/guarding. No obvious abdominal masses. Abdominal aorta is normal size without bruit Extremities: No edema. no cyanosis, no clubbing, no ulcers  Peripheral : 2+ bilateral upper extremity pulses, 2+ bilateral femoral pulses, 2+ bilateral dorsal pedal pulse Neuro: Alert and oriented. No facial asymmetry. No focal deficit. Moves all extremities spontaneously. Musculoskeletal: Normal muscle tone without kyphosis Psych:  Responds to questions appropriately with a normal affect.    Assessment: 53 year old female with drug abuse having methicillin sensitive Staphylococcus aureus bacteremia without evidence of current endocarditis or structural or functional cardiac issues  Plan: 1.  No further cardiac diagnostics necessary at this time due to transesophageal echocardiogram showing no evidence of vegetations endocarditis or structural issues 2.  Continue  supportive care of infection and disability 3.  Call if further questions  Signed, Corey Skains M.D. [facility] 10/18/2018, 1:12 PM

## 2018-10-18 NOTE — Anesthesia Preprocedure Evaluation (Addendum)
Anesthesia Evaluation  Patient identified by MRN, date of birth, ID band Patient awake and Patient confused    Reviewed: Allergy & Precautions, H&P , NPO status , Patient's Chart, lab work & pertinent test results  History of Anesthesia Complications Negative for: history of anesthetic complications  Airway Mallampati: III  TM Distance: >3 FB Neck ROM: full    Dental  (+) Chipped, Poor Dentition, Missing   Pulmonary neg shortness of breath, former smoker,           Cardiovascular (-) angina(-) Past MI      Neuro/Psych  Headaches, PSYCHIATRIC DISORDERS  Neuromuscular disease    GI/Hepatic GERD  Medicated and Controlled,(+)     substance abuse  ,   Endo/Other  negative endocrine ROS  Renal/GU negative Renal ROS  negative genitourinary   Musculoskeletal  (+) Arthritis ,   Abdominal   Peds  Hematology negative hematology ROS (+)   Anesthesia Other Findings Past Medical History: No date: Anxiety No date: Arthritis     Comment:  PSORIATIC ARTHRITIS No date: Back pain No date: Colon polyp No date: Depression No date: Headache     Comment:  MIGRAINE No date: Ulcer  Past Surgical History: No date: BREAST REDUCTION SURGERY No date: COLONOSCOPY WITH ESOPHAGOGASTRODUODENOSCOPY (EGD) 05/19/2016: COLONOSCOPY WITH PROPOFOL; N/A     Comment:  Procedure: COLONOSCOPY WITH PROPOFOL;  Surgeon: Lollie Sails, MD;  Location: [facility] ENDOSCOPY;  Service:               Endoscopy;  Laterality: N/A; 03/06/2018: COLONOSCOPY WITH PROPOFOL; N/A     Comment:  Procedure: COLONOSCOPY WITH PROPOFOL;  Surgeon: Toledo,               Benay Pike, MD;  Location: [facility] ENDOSCOPY;  Service:               Gastroenterology;  Laterality: N/A; 03/06/2018: ESOPHAGOGASTRODUODENOSCOPY (EGD) WITH PROPOFOL; N/A     Comment:  Procedure: ESOPHAGOGASTRODUODENOSCOPY (EGD) WITH               PROPOFOL;  Surgeon: Toledo, Benay Pike, MD;   Location:               [facility] ENDOSCOPY;  Service: Gastroenterology;  Laterality:               N/A; No date: foot spurs No date: FRACTURE SURGERY 10/16/2018: IRRIGATION AND DEBRIDEMENT KNEE; Left     Comment:  Procedure: IRRIGATION AND DEBRIDEMENT KNEE ARTHROSCOPY;               Surgeon: Lovell Sheehan, MD;  Location: [facility] ORS;                Service: Orthopedics;  Laterality: Left; No date: MANDIBLE FRACTURE SURGERY No date: TUBAL LIGATION  BMI    Body Mass Index: 30.55 kg/m      Reproductive/Obstetrics negative OB ROS                           Anesthesia Physical Anesthesia Plan  ASA: IV  Anesthesia Plan: General   Post-op Pain Management:    Induction: Intravenous  PONV Risk Score and Plan: Propofol infusion and TIVA  Airway Management Planned: Natural Airway and Nasal Cannula  Additional Equipment:   Intra-op Plan:   Post-operative Plan:   Informed Consent: I have reviewed the patients History and Physical, chart, labs  and discussed the procedure including the risks, benefits and alternatives for the proposed anesthesia with the patient or authorized representative who has indicated his/her understanding and acceptance.     Dental Advisory Given  Plan Discussed with: Anesthesiologist, CRNA and Surgeon  Anesthesia Plan Comments: (Phone consent from sister Claiborne Rigg at [phone]   Patient and nursing report that patient is NPO appropriate   Patient and sister consented for risks of anesthesia including but not limited to:  - adverse reactions to medications - risk of intubation if required - damage to teeth, lips or other oral mucosa - sore throat or hoarseness - Damage to heart, brain, lungs or loss of life  They voiced understanding.)      Anesthesia Quick Evaluation

## 2018-10-18 NOTE — Anesthesia Postprocedure Evaluation (Signed)
Anesthesia Post Note  Patient: [name]  Procedure(s) Performed: TRANSESOPHAGEAL ECHOCARDIOGRAM (TEE) (N/A )  Patient location during evaluation: Cath Lab Anesthesia Type: General Level of consciousness: awake Pain management: pain level controlled Vital Signs Assessment: post-procedure vital signs reviewed and stable Respiratory status: spontaneous breathing, nonlabored ventilation, respiratory function stable and patient connected to nasal cannula oxygen Cardiovascular status: blood pressure returned to baseline and stable Postop Assessment: no apparent nausea or vomiting Anesthetic complications: no     Last Vitals:  Vitals:   10/18/18 0929 10/18/18 0930  BP: 125/84   Pulse: (!) 30   Resp: (!) 22   Temp:    SpO2: 98% 95%    Last Pain:  Vitals:   10/18/18 0822  TempSrc: Oral  PainSc:                  [name] [name]

## 2018-10-18 NOTE — Procedures (Signed)
Pre procedural Dx: Paraspinal abscess Post procedural Dx: Same  No attempt at CT-guided aspiration of ill-defined right-sided paraspinal fluid collection secondary to patient's inability to tolerate positioning on the table and/or cooperate with instructions to remain still despite the administration of conscious sedation.   Complications: None immediate  Above findings discussed with Dr. Anselm Jungling at the time of procedure cancellation.  Ronny Bacon, MD Pager #: [phone]

## 2018-10-18 NOTE — Progress Notes (Addendum)
Pt wants something to drink.  Explained NPO status multiple times to pt.  Pt wants to leave AMA.  Explained to pt that she can discuss it with the physician when he rounds.  Pt called 911 stating that she is being held against her will.  Officer came up to see pt and discussed staying until she sees the doctor this morning. Pt agrees to stay but wants to drink.  Explained to pt that she cannot have anything to drink until she speaks to the physician. Pt laid back in bed. Dorna Bloom RN Spoke with pt's aunt Pamala Hurry and explained that pt wanted to leave and reason that she needs to stay.  Aunt agrees to speak to pt. Dorna Bloom RN

## 2018-10-18 NOTE — Progress Notes (Signed)
Patient clinically stable post TEE under anesthesia per Dr Nehemiah Massed. Vitals stable. Denies complaints. Report called to [facility] RN on 1C with plan reviewed,questions answered. Patient to go to CT for abscess drain post recovery from TEE,

## 2018-10-18 NOTE — CV Procedure (Signed)
Transesophageal echocardiogram preliminary report  [name] [id number] June 17, 1965  Preliminary diagnosis  Bacteremia with possible endocarditis  Postprocedural diagnosis Normal LV function without evidence of endocarditis or vegetation  Time out A timeout was performed by the nursing staff and physicians specifically identifying the procedure performed, identification of the patient, the type of sedation, all allergies and medications, all pertinent medical history, and presedation assessment of nasopharynx. The patient and or family understand the risks of the procedure including the rare risks of death, stroke, heart attack, esophogeal perforation, sore throat, and reaction to medications given.  Moderate sedation During this procedure the patient has received Versed  and fentanyl  And propofol  to achieve appropriate moderate sedation as per anesthesia.  The patient had continued monitoring of heart rate, oxygenation, blood pressure, respiratory rate, and extent of signs of sedation throughout the entire procedure.  The patient received this moderate sedation over a period of 18 minutes.  Both the nursing staff and I were present during the procedure when the patient had moderate sedation for 100% of the time.  Treatment considerations  No additional treatment considerations needed for bacteremia due to no current evidence of endocarditis  For further details of transesophageal echocardiogram please refer to final report.  Signed,  Corey Skains M.D. [facility] 10/18/2018 8:53 AM

## 2018-10-18 NOTE — Progress Notes (Signed)
[name]    MR#:  [id number]  DATE OF BIRTH:  [birth year]  SUBJECTIVE:  CHIEF COMPLAINT:   Chief Complaint  Patient presents with  . Altered Mental Status   Came with AMS, c/o pain all over. Status post knee I&D. TEE is done but drainage of paraspinal abscess could not be done due to patient movements.  REVIEW OF SYSTEMS:  CONSTITUTIONAL: No fever, fatigue or weakness.  EYES: No blurred or double vision.  EARS, NOSE, AND THROAT: No tinnitus or ear pain.  RESPIRATORY: No cough, shortness of breath, wheezing or hemoptysis.  CARDIOVASCULAR: No chest pain, orthopnea, edema.  GASTROINTESTINAL: No nausea, vomiting, diarrhea or abdominal pain.  GENITOURINARY: No dysuria, hematuria.  ENDOCRINE: No polyuria, nocturia,  HEMATOLOGY: No anemia, easy bruising or bleeding SKIN: No rash or lesion. MUSCULOSKELETAL: No joint pain or arthritis.   NEUROLOGIC: No tingling, numbness, weakness.  PSYCHIATRY: No anxiety or depression.   ROS  DRUG ALLERGIES:   Allergies  Allergen Reactions  . Topiramate Hives    VITALS:  Blood pressure (!) 154/94, pulse 77, temperature 98.2 F (36.8 C), resp. rate 11, height 5\' 4"  (1.626 m), weight 80.7 kg, last menstrual period 01/22/2017, SpO2 97 %.  PHYSICAL EXAMINATION:   GENERAL:  53 y.o.-year-old patient lying in the bed with no acute distress.  EYES: Pupils equal, round, reactive to light and accommodation. No scleral icterus. Extraocular muscles intact.  HEENT: Head atraumatic, normocephalic. Oropharynx and nasopharynx clear.  NECK:  Supple, no jugular venous distention. No thyroid enlargement, no tenderness.  LUNGS: Normal breath sounds bilaterally, no wheezing, rales,rhonchi or crepitation. No use of accessory muscles of respiration.  CARDIOVASCULAR: S1, S2 normal. No murmurs, rubs, or gallops.  ABDOMEN: Soft, nontender, distended. Bowel sounds present. No organomegaly or mass.   EXTREMITIES: No pedal edema, cyanosis, or clubbing.  Left knee less swollen after joint effusion tapped. NEUROLOGIC: Cranial nerves II through XII are intact. Muscle strength 5/5 in all extremities. Sensation intact. Gait not checked.  PSYCHIATRIC: The patient is alert and oriented x 3.  SKIN: No rash, lesion, or ulcer. Left 3rd toe have swelling. s/p I & D on toe- dressing in place. Marland Kitchen   Physical Exam LABORATORY PANEL:   CBC Recent Labs  Lab 10/16/18 0010  WBC 24.3*  HGB 8.5*  HCT 25.5*  PLT 501*   ------------------------------------------------------------------------------------------------------------------  Chemistries  Recent Labs  Lab 10/13/18 1419  10/16/18 0010  NA 127*   < > 130*  K 2.8*   < > 3.7  CL 83*   < > 97*  CO2 31   < > 25  GLUCOSE 159*   < > 106*  BUN 28*   < > 11  CREATININE 1.01*   < > 0.57  CALCIUM 7.8*   < > 7.4*  MG 2.6*  --   --   AST 88*   < > 19  ALT 47*   < > 18  ALKPHOS 759*   < > 436*  BILITOT 3.8*   < > 1.5*   < > = values in this interval not displayed.   ------------------------------------------------------------------------------------------------------------------  Cardiac Enzymes Recent Labs  Lab 10/13/18 1419  TROPONINI 0.05*   ------------------------------------------------------------------------------------------------------------------  RADIOLOGY:  Ct Chest W Contrast  Result Date: 10/17/2018 CLINICAL DATA:  Epidural abscess. EXAM: CT CHEST WITH CONTRAST TECHNIQUE: Multidetector CT imaging of the chest was performed during intravenous contrast administration. CONTRAST:  28mL OMNIPAQUE IOHEXOL 300  MG/ML  SOLN COMPARISON:  None. FINDINGS: Cardiovascular: Heart size upper normal. No substantial pericardial effusion. No thoracic aortic aneurysm. Mediastinum/Nodes: No mediastinal lymphadenopathy. There is no hilar lymphadenopathy. There is no axillary lymphadenopathy. The esophagus has normal imaging features. Lungs/Pleura:  5 mm right middle lobe pulmonary nodule visible on image 65/series 3. Bilateral lower lobe collapse/consolidation noted, left greater than right. Small bilateral pleural effusions evident. Upper Abdomen: Unremarkable. Musculoskeletal: No worrisome lytic or sclerotic osseous abnormality. As noted on recent MRI, a right paraspinal fluid collection is identified in the lower thoracic region, extending from approximately T9 down to T11 measuring about 1.2 x 4.0 x 4.7 cm. IMPRESSION: 1. Right paraspinal fluid collection extends from T9 down to the T11 level. This is not as well demonstrated by CT as it was on the recent MRI. 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 2. Bilateral lower lobe collapse/consolidation, left greater than right with small bilateral pleural effusions. 3. 5 mm right middle lobe pulmonary nodule. No follow-up needed if patient is low-risk. Non-contrast chest CT can be considered in 12 months if patient is high-risk. This recommendation follows the consensus statement: Guidelines for Management of Incidental Pulmonary Nodules Detected on CT Images: From the Fleischner Society 2017; Radiology 2017; 284:228-243. Electronically Signed   By: Misty Stanley M.D.   On: 10/17/2018 18:16   Mr Thoracic Spine W Contrast  Result Date: 10/16/2018 CLINICAL DATA:  Follow-up examination for epidural abscess, infection. EXAM: MRI THORACIC AND LUMBAR SPINE WITHOUT AND WITH CONTRAST TECHNIQUE: Multiplanar and multiecho pulse sequences of the thoracic and lumbar spine were obtained without and with intravenous contrast. CONTRAST:  7.5 cc of Gadavist. COMPARISON:  Prior precontrast MRI from 10/15/2018. FINDINGS: MRI THORACIC SPINE FINDINGS Alignment: Limited pre and postcontrast T1 weighted imaging of the thoracic spine was performed. Stable alignment with preservation of the normal thoracic kyphosis. No  listhesis. Vertebrae: Vertebral body height maintained without evidence for acute or interval fracture. Abnormal enhancement seen within the right posterior aspect of the T11 vertebral body extending into the right T11 pedicle (series 23, image 6). Associated marrow edema seen within this region on prior precontrast MRI. Enhancement extends into the adjacent right T10-11 facet. Finding likely reflects sequelae of osteomyelitis and/or septic arthritis, suspected to be source of infection. Adjacent T10-11 disc grossly maintained without evidence for significant discitis at this time. Additional abnormal enhancement seen about the left T3-4 facet (series 23, image 14), which could also reflect septic arthritis. Enhancement extends into the adjacent right T3 transverse process. Additional more mild enhancement seen about the right T6-7 facet, also suspicious for possible septic arthritis (series 23, image 7). Visualized bone marrow signal intensity otherwise within normal limits. No other definite evidence for osteomyelitis. Cord: Abnormal epidural enhancement with phlegmon and/or abscess seen involving the left ventral epidural space, extending from approximately the T10 level inferiorly through the upper lumbar spine (series 23, image 11). This measures up to approximately 7 mm in maximal diameter at the level of T12. No significant spinal stenosis at this time. Additional abnormal epidural enhancement seen within the dorsal epidural space at the level of T2 through T4 (series 23, image 12). No frank epidural collection. No other abnormal enhancement within the thoracic spinal cord. No other visible intracanalicular collections, although evaluation somewhat limited by motion artifact on axial sequences. Mild peridiscal enhancement noted about the T8-9 disc protrusion. Paraspinal and other soft tissues: Prominent paraspinous  phlegmon and enhancement seen extending from T9 through T11, worse on the right. There is a  heterogeneous paraspinous abscess at the level of T9-10 that measures 3.0 x 1.4 x 4.6 cm (AP by transverse by craniocaudad) (series 22, image 31). Probable few additional smaller loculated collections seen within the adjacent right paraspinous soft tissues. Mild soft tissue enhancement seen adjacent to the left T3-4 facet without frank collection. Bilateral pleural effusions with associated atelectasis and/or consolidation noted. Disc levels: T8-9: Central disc protrusion with resultant mild spinal stenosis again noted. Long segment epidural collection extending from approximately the T10 level through the upper lumbar spine without significant spinal stenosis. MRI LUMBAR SPINE FINDINGS Segmentation: Standard. Lowest well-formed disc labeled the L5-S1 level. Alignment: Exaggeration of the normal lumbar lordosis without listhesis. Vertebrae: Vertebral body height maintained without evidence for acute or chronic fracture. Bone marrow signal intensity within normal limits. Subcentimeter benign hemangiomas noted within the L5 and S1 vertebral bodies. No worrisome osseous lesions. Abnormal marrow edema seen about the bilateral L4-5 facets, right worse than left, likely due to septic arthritis. No other definite evidence for osteomyelitis or discitis. Conus medullaris: Extends to the T12-L1 level and appears normal. Extensive abnormal epidural enhancement seen throughout the lumbar spinal canal, compatible with infection. There is a ventral epidural abscess extending from the visualized thoracic spine inferiorly to approximately the L4-5 level. This is maximal in diameter at the level of L1 through L4, measuring approximately 8 mm in greatest diameter. Extension into the right and posterior epidural space at the level of L4 (series 4, image 28). Paraspinal and other soft tissues: Abnormal enhancement and edema adjacent to the right greater than left L4-5 facets due to septic arthritis. Few irregular loculated soft tissue  abscesses seen adjacent to the right L4-5 facet, measuring 17 mm (series 7, image 27) and 24 mm (series 7, image 30). Adjacent diffuse edema and enhancement throughout the lower posterior paraspinous soft tissues, compatible with associated myositis. Small amount of free fluid noted within the pelvis. Visualized visceral structures grossly within normal limits. Disc levels: L1-2: Negative interspace. Small ventral epidural abscess with resultant mild spinal stenosis. Foramina remain patent. L2-3: Negative interspace. Prominent ventral epidural abscess measuring up to 8 mm in maximal diameter. Resultant severe spinal stenosis. Thecal sac is compressed and measures 4 mm in AP diameter at its most narrow point (series 4, image 23). Foramina remain patent. L3-4: Disc desiccation without disc bulge. Ventral epidural abscess with mild moderate facet hypertrophy. Joint effusion on the right, which could reflect early septic arthritis. Fairly severe spinal stenosis with the thecal sac compressed and measuring 4 mm in AP diameter at its most narrow point. L4-5: Negative interspace. Epidural phlegmon/abscess within the ventral epidural space, extending into the right lateral and posterior epidural space. Abscess extends into the right L4-5 foramen. Bilateral facet degeneration with findings concerning for septic arthritis. Adjacent paraspinous abscesses adjacent to the right L4-5 facet. Small joint effusions. Mild canal with bilateral subarticular stenosis. L5-S1: Negative interspace. Moderate facet degeneration. Epidural enhancement without significant collection. Foramina remain patent. IMPRESSION: MRI THORACIC SPINE IMPRESSION: 1. 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 Associated long segment ventral epidural phlegmon/abscess extending from T10 inferiorly through the upper lumbar spine, measuring 7 mm in maximal  diameter at the level of T12. No significant spinal stenosis at this time. 2. Associated right paraspinous abscess measuring up to 4.6 cm at the level of T9-10.  3. Abnormal enhancement about the left T3-4 and right T6-7 facets, also likely reflecting sequelae of septic arthritis. MRI LUMBAR SPINE IMPRESSION: 1. Long segment predominantly ventral epidural abscess extending from the thoracic spine to the level of L4-5, measuring 8 mm in maximal diameter. Resultant severe spinal stenosis at the levels of L2-3 and L3-4. 2. Abnormal edema and enhancement about the right greater than left L4-5 facets, like related to septic arthritis. Few small soft tissue abscesses adjacent to the right L4-5 facet measure up to 2.4 cm. Electronically Signed   By: Jeannine Boga M.D.   On: 10/16/2018 23:59   Mr Lumbar Spine W Wo Contrast  Result Date: 10/16/2018 CLINICAL DATA:  Follow-up examination for epidural abscess, infection. EXAM: MRI THORACIC AND LUMBAR SPINE WITHOUT AND WITH CONTRAST TECHNIQUE: Multiplanar and multiecho pulse sequences of the thoracic and lumbar spine were obtained without and with intravenous contrast. CONTRAST:  7.5 cc of Gadavist. COMPARISON:  Prior precontrast MRI from 10/15/2018. FINDINGS: MRI THORACIC SPINE FINDINGS Alignment: Limited pre and postcontrast T1 weighted imaging of the thoracic spine was performed. Stable alignment with preservation of the normal thoracic kyphosis. No listhesis. Vertebrae: Vertebral body height maintained without evidence for acute or interval fracture. Abnormal enhancement seen within the right posterior aspect of the T11 vertebral body extending into the right T11 pedicle (series 23, image 6). Associated marrow edema seen within this region on prior precontrast MRI. Enhancement extends into the adjacent right T10-11 facet. Finding likely reflects sequelae of osteomyelitis and/or septic arthritis, suspected to be source of infection. Adjacent T10-11 disc grossly  maintained without evidence for significant discitis at this time. Additional abnormal enhancement seen about the left T3-4 facet (series 23, image 14), which could also reflect septic arthritis. Enhancement extends into the adjacent right T3 transverse process. Additional more mild enhancement seen about the right T6-7 facet, also suspicious for possible septic arthritis (series 23, image 7). Visualized bone marrow signal intensity otherwise within normal limits. No other definite evidence for osteomyelitis. Cord: Abnormal epidural enhancement with phlegmon and/or abscess seen involving the left ventral epidural space, extending from approximately the T10 level inferiorly through the upper lumbar spine (series 23, image 11). This measures up to approximately 7 mm in maximal diameter at the level of T12. No significant spinal stenosis at this time. Additional abnormal epidural enhancement seen within the dorsal epidural space at the level of T2 through T4 (series 23, image 12). No frank epidural collection. No other abnormal enhancement within the thoracic spinal cord. No other visible intracanalicular collections, although evaluation somewhat limited by motion artifact on axial sequences. Mild peridiscal enhancement noted about the T8-9 disc protrusion. Paraspinal and other soft tissues: Prominent paraspinous phlegmon and enhancement seen extending from T9 through T11, worse on the right. There is a heterogeneous paraspinous abscess at the level of T9-10 that measures 3.0 x 1.4 x 4.6 cm (AP by transverse by craniocaudad) (series 22, image 31). Probable few additional smaller loculated collections seen within the adjacent right paraspinous soft tissues. Mild soft tissue enhancement seen adjacent to the left T3-4 facet without frank collection. Bilateral pleural effusions with associated atelectasis and/or consolidation noted. Disc levels: T8-9: Central disc protrusion with resultant mild spinal stenosis again noted.  Long segment epidural collection extending from approximately the T10 level through the upper lumbar spine without significant spinal stenosis. MRI LUMBAR SPINE FINDINGS Segmentation: Standard. Lowest well-formed disc labeled the L5-S1 level. Alignment: Exaggeration of the normal lumbar lordosis without listhesis. Vertebrae: Vertebral body height maintained without  evidence for acute or chronic fracture. Bone marrow signal intensity within normal limits. Subcentimeter benign hemangiomas noted within the L5 and S1 vertebral bodies. No worrisome osseous lesions. Abnormal marrow edema seen about the bilateral L4-5 facets, right worse than left, likely due to septic arthritis. No other definite evidence for osteomyelitis or discitis. Conus medullaris: Extends to the T12-L1 level and appears normal. Extensive abnormal epidural enhancement seen throughout the lumbar spinal canal, compatible with infection. There is a ventral epidural abscess extending from the visualized thoracic spine inferiorly to approximately the L4-5 level. This is maximal in diameter at the level of L1 through L4, measuring approximately 8 mm in greatest diameter. Extension into the right and posterior epidural space at the level of L4 (series 4, image 28). Paraspinal and other soft tissues: Abnormal enhancement and edema adjacent to the right greater than left L4-5 facets due to septic arthritis. Few irregular loculated soft tissue abscesses seen adjacent to the right L4-5 facet, measuring 17 mm (series 7, image 27) and 24 mm (series 7, image 30). Adjacent diffuse edema and enhancement throughout the lower posterior paraspinous soft tissues, compatible with associated myositis. Small amount of free fluid noted within the pelvis. Visualized visceral structures grossly within normal limits. Disc levels: L1-2: Negative interspace. Small ventral epidural abscess with resultant mild spinal stenosis. Foramina remain patent. L2-3: Negative interspace.  Prominent ventral epidural abscess measuring up to 8 mm in maximal diameter. Resultant severe spinal stenosis. Thecal sac is compressed and measures 4 mm in AP diameter at its most narrow point (series 4, image 23). Foramina remain patent. L3-4: Disc desiccation without disc bulge. Ventral epidural abscess with mild moderate facet hypertrophy. Joint effusion on the right, which could reflect early septic arthritis. Fairly severe spinal stenosis with the thecal sac compressed and measuring 4 mm in AP diameter at its most narrow point. L4-5: Negative interspace. Epidural phlegmon/abscess within the ventral epidural space, extending into the right lateral and posterior epidural space. Abscess extends into the right L4-5 foramen. Bilateral facet degeneration with findings concerning for septic arthritis. Adjacent paraspinous abscesses adjacent to the right L4-5 facet. Small joint effusions. Mild canal with bilateral subarticular stenosis. L5-S1: Negative interspace. Moderate facet degeneration. Epidural enhancement without significant collection. Foramina remain patent. IMPRESSION: MRI THORACIC SPINE IMPRESSION: 1. 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 Associated long segment ventral epidural phlegmon/abscess extending from T10 inferiorly through the upper lumbar spine, measuring 7 mm in maximal diameter at the level of T12. No significant spinal stenosis at this time. 2. Associated right paraspinous abscess measuring up to 4.6 cm at the level of T9-10. 3. Abnormal enhancement about the left T3-4 and right T6-7 facets, also likely reflecting sequelae of septic arthritis. MRI LUMBAR SPINE IMPRESSION: 1. Long segment predominantly ventral epidural abscess extending from the thoracic spine to the level of L4-5, measuring 8 mm in maximal diameter. Resultant severe spinal stenosis at the levels of L2-3 and L3-4.  2. Abnormal edema and enhancement about the right greater than left L4-5 facets, like related to septic arthritis. Few small soft tissue abscesses adjacent to the right L4-5 facet measure up to 2.4 cm. Electronically Signed   By: Jeannine Boga M.D.   On: 10/16/2018 23:59   Ct Chest Limited Wo Contrast  Result Date: 10/18/2018 INDICATION: History of right-sided paraspinal fluid collection at the T9-T11 level. Please perform CT-guided aspiration for identification of infectious agent. EXAM: CT CHEST LIMITED  WITHOUT CONTRAST COMPARISON:  Chest CT-10/17/2018; thoracic spine MRI-10/16/2018 MEDICATIONS: The patient is currently admitted to the hospital and receiving intravenous antibiotics. The antibiotics were administered within an appropriate time frame prior to the initiation of the procedure. ANESTHESIA/SEDATION: Moderate (conscious) sedation was employed during this procedure. A total of Versed 2 mg and Fentanyl 75 mcg was administered intravenously. Moderate Sedation Time: 15 minutes. The patient's level of consciousness and vital signs were monitored continuously by radiology nursing throughout the procedure under my direct supervision. CONTRAST:  None COMPLICATIONS: None immediate. PROCEDURE: Informed written consent was obtained from the patient's sister after a discussion of the risks, benefits and alternatives to treatment. The patient was placed prone on the CT gantry and a pre procedural CT was performed re-demonstrating the known abscess/fluid collection within the right paraspinal space adjacent to the T9-T11 vertebral bodies with dominant ill-defined collection measuring approximately 4.0 x 1.5 cm (image 21, series 2). The procedure was planned. A timeout was performed prior to the initiation of the procedure. Despite the administration of conscious sedation medicines, the patient was unable to maintain positioning to allow for safe CT-guided paraspinal fluid collection aspiration. As such, no  procedure was attempted. IMPRESSION: No attempt at CT-guided aspiration of ill-defined right-sided paraspinal fluid collection secondary to patient's inability to tolerate positioning on the table and/or cooperate with instructions to remain still despite the administration of conscious sedation. Above findings discussed with Dr. Anselm Jungling at the time of procedure cancellation. Electronically Signed   By: Sandi Mariscal M.D.   On: 10/18/2018 12:50    ASSESSMENT AND PLAN:   Active Problems:   Sepsis ([place])  1.  Clinical sepsis with leukocytosis tachycardia and fever.  Staph bacteremia-MSSA  COVID-19 testing negative.  Urinalysis positive.  Patient also has a left third toe that is red and inflamed.  Left knee was tapped with high WBCs in fluid  Aggressive antibiotics with vancomycin and cefepime- Changed to Nafcilline by ID. Appreciated podiatry consultation- I & D on toe done. Appreciated orthopedic consultation.       Have 54000 WBCs in knee aspirate, Ortho had done lavage of left knee.      TEE is done and does not show any signs of endocarditis or valvular disease. MRI of cervical and thoracic spine shows osteomyelitis and epidural abscess, consulted neurosurgery.  Suggested to do MRI with contrast which is done and as patient does not have any signs of compression on the cord they are suggesting to try drainage by IR, I have consulted IR who suggested to do a CT chest with contrast to check anatomy. IR tried to do drainage of paraspinous abscess but patient was moving a lot and they could not complete it in spite of patient having general anesthesia for TEE right before this procedure.  2.  Elevated liver function tests.  Could be secondary to drugs.  Ultrasound of the right upper quadrant - no acute findings.  Follow-up liver enzymes.  Coming down.  3.  Hyponatremia and hypokalemia.  IV fluids with potassium in normal saline.  Give oral K. Improved.  4.  Drug abuse.  Will put on low-dose  Xanax for now to prevent withdrawal.  Low-dose oxycodone for pain. Give valium.  Will give IV toradol to help pain of septic joint and toe abscess.   Psych and social work to help manage rehab/ child custody issues.  5.  Anxiety depression continue usual medications.   6.  History of ulcers.  Will give Protonix IV.  All the records are reviewed and case discussed with Care Management/Social Workerr. Management plans discussed with the patient, family and they are in agreement.  CODE STATUS: Full.  TOTAL TIME TAKING CARE OF THIS PATIENT: 35 minutes.   POSSIBLE D/C IN 2-3 DAYS, DEPENDING ON CLINICAL CONDITION.   Vaughan Basta M.D on 10/18/2018   Between 7am to 6pm - Pager - [phone]  After 6pm go to [email or web address] - password [email or web address] [facility]  Office  [phone]  CC: Primary care physician; Marguerita Merles, MD  Note: This dictation was prepared with Dragon dictation along with smaller phrase technology. Any transcriptional errors that result from this process are unintentional.

## 2018-10-18 NOTE — Progress Notes (Signed)
PT Cancellation Note  Patient Details Name: Traniece Boffa MRN: [id number] DOB: 1966-01-19   Cancelled Treatment:    Reason Eval/Treat Not Completed: Patient declined, no reason specified;Patient's level of consciousness Pt initially minimally opened eyes and answered "Yes" to going for a walk today, however she struggled to keep eyes open and despite multiple times getting her to vaguely agree to do exercises or walking again she never woke enough (despite repeated shoulder and leg rubs along with calling of name).  She repeatedly only grunted refusal.  Will try back later as appropriate.  Kreg Shropshire 10/18/2018, 5:24 PM

## 2018-10-18 NOTE — Progress Notes (Signed)
*  PRELIMINARY RESULTS* Echocardiogram 2D Echocardiogram has been performed.  [name] 10/18/2018, 8:59 AM

## 2018-10-18 NOTE — Sedation Documentation (Signed)
Procedure cancelled per Dr. Pascal Lux.  See MD note.

## 2018-10-18 NOTE — Anesthesia Post-op Follow-up Note (Signed)
Anesthesia QCDR form completed.        

## 2018-10-18 NOTE — Consult Note (Signed)
[facility] Face-to-Face Psychiatry Consult   Reason for Consult:  Substance abuse Referring Physician:  Dr Earleen Newport Patient Identification: [name] MRN:  [id number] Principal Diagnosis: Left knee issues Diagnosis:  Active Problems:   Substance abuse ([place])   Sepsis ([place])  Total Time spent with patient: 1 hour  Subjective:   [name] is a 53 y.o. female patient admitted with left knee pain, consulted .  HPI:  53 yo female who came to the ED and admitted for left knee issues.  Her family and significant other reports substance abuse issues but when I met with the patient, she denies having any issues with substances.  Denies suicidal/homicidal ideations, hallucinations, and past history of psychiatric issues. Patient concerned on assessment with her knee pain and discomfort, not interested in pursuing rehab or outpatient resources when this was recommended to her.  Continues to minimize her substance use at this time.  Past Psychiatric History: substance abuse  Risk to Self:  none Risk to Others:  none Prior Inpatient Therapy:  none Prior Outpatient Therapy:  none  Past Medical History:  Past Medical History:  Diagnosis Date  . Anxiety   . Arthritis    PSORIATIC ARTHRITIS  . Back pain   . Colon polyp   . Depression   . Headache    MIGRAINE  . Ulcer     Past Surgical History:  Procedure Laterality Date  . BREAST REDUCTION SURGERY    . COLONOSCOPY WITH ESOPHAGOGASTRODUODENOSCOPY (EGD)    . COLONOSCOPY WITH PROPOFOL N/A 05/19/2016   Procedure: COLONOSCOPY WITH PROPOFOL;  Surgeon: Lollie Sails, MD;  Location: [facility] ENDOSCOPY;  Service: Endoscopy;  Laterality: N/A;  . COLONOSCOPY WITH PROPOFOL N/A 03/06/2018   Procedure: COLONOSCOPY WITH PROPOFOL;  Surgeon: Toledo, Benay Pike, MD;  Location: [facility] ENDOSCOPY;  Service: Gastroenterology;  Laterality: N/A;  . ESOPHAGOGASTRODUODENOSCOPY (EGD) WITH PROPOFOL N/A 03/06/2018   Procedure: ESOPHAGOGASTRODUODENOSCOPY (EGD) WITH PROPOFOL;   Surgeon: Toledo, Benay Pike, MD;  Location: [facility] ENDOSCOPY;  Service: Gastroenterology;  Laterality: N/A;  . foot spurs    . FRACTURE SURGERY    . IRRIGATION AND DEBRIDEMENT KNEE Left 10/16/2018   Procedure: IRRIGATION AND DEBRIDEMENT KNEE ARTHROSCOPY;  Surgeon: Lovell Sheehan, MD;  Location: [facility] ORS;  Service: Orthopedics;  Laterality: Left;  Marland Kitchen MANDIBLE FRACTURE SURGERY    . TUBAL LIGATION     Family History:  Family History  Problem Relation Age of Onset  . Liver cancer Mother   . Liver disease Mother   . Cirrhosis Mother   . Alcohol abuse Mother   . Cancer Father   . Colon cancer Maternal Aunt    Family Psychiatric  History: none Social History:  Social History   Substance and Sexual Activity  Alcohol Use Never  . Frequency: Never     Social History   Substance and Sexual Activity  Drug Use Yes  . Types: "Crack" cocaine, Marijuana    Social History   Socioeconomic History  . Marital status: Single    Spouse name: Not on file  . Number of children: Not on file  . Years of education: Not on file  . Highest education level: Not on file  Occupational History  . Not on file  Social Needs  . Financial resource strain: Not on file  . Food insecurity    Worry: Not on file    Inability: Not on file  . Transportation needs    Medical: Not on file    Non-medical: Not on file  Tobacco Use  . Smoking status: Former Smoker    Types: Cigarettes  . Smokeless tobacco: Never Used  Substance and Sexual Activity  . Alcohol use: Never    Frequency: Never  . Drug use: Yes    Types: "Crack" cocaine, Marijuana  . Sexual activity: Not on file  Lifestyle  . Physical activity    Days per week: Not on file    Minutes per session: Not on file  . Stress: Not on file  Relationships  . Social Herbalist on phone: Not on file    Gets together: Not on file    Attends religious service: Not on file    Active member of club or organization: Not on file    Attends  meetings of clubs or organizations: Not on file    Relationship status: Not on file  Other Topics Concern  . Not on file  Social History Narrative  . Not on file   Additional Social History:    Allergies:   Allergies  Allergen Reactions  . Topiramate Hives    Labs:  Results for orders placed or performed during the hospital encounter of 10/13/18 (from the past 48 hour(s))  Pregnancy, urine     Status: None   Collection Time: 10/16/18  2:03 PM  Result Value Ref Range   Preg Test, Ur NEGATIVE NEGATIVE    Comment: Performed at [facility], [address], [place], [place] [address]  Urine Drug Screen, Qualitative ([place] only)     Status: Abnormal   Collection Time: 10/17/18  8:55 AM  Result Value Ref Range   Tricyclic, Ur Screen NONE DETECTED NONE DETECTED   Amphetamines, Ur Screen NONE DETECTED NONE DETECTED   MDMA (Ecstasy)Ur Screen NONE DETECTED NONE DETECTED   Cocaine Metabolite,Ur [place] NONE DETECTED NONE DETECTED   Opiate, Ur Screen POSITIVE (A) NONE DETECTED   Phencyclidine (PCP) Ur S NONE DETECTED NONE DETECTED   Cannabinoid 50 Ng, Ur [place] POSITIVE (A) NONE DETECTED   Barbiturates, Ur Screen NONE DETECTED NONE DETECTED   Benzodiazepine, Ur Scrn POSITIVE (A) NONE DETECTED   Methadone Scn, Ur NONE DETECTED NONE DETECTED    Comment: (NOTE) Tricyclics + metabolites, urine    Cutoff 1000 ng/mL Amphetamines + metabolites, urine  Cutoff 1000 ng/mL MDMA (Ecstasy), urine              Cutoff 500 ng/mL Cocaine Metabolite, urine          Cutoff 300 ng/mL Opiate + metabolites, urine        Cutoff 300 ng/mL Phencyclidine (PCP), urine         Cutoff 25 ng/mL Cannabinoid, urine                 Cutoff 50 ng/mL Barbiturates + metabolites, urine  Cutoff 200 ng/mL Benzodiazepine, urine              Cutoff 200 ng/mL Methadone, urine                   Cutoff 300 ng/mL The urine drug screen provides only a preliminary, unconfirmed analytical test result and should not be used for  non-medical purposes. Clinical consideration and professional judgment should be applied to any positive drug screen result due to possible interfering substances. A more specific alternate chemical method must be used in order to obtain a confirmed analytical result. Gas chromatography / mass spectrometry (GC/MS) is the preferred confirmat ory method. Performed at [facility],  [place], [place] [address]     Current Facility-Administered Medications  Medication Dose Route Frequency Provider Last Rate Last Dose  . 0.9 %  sodium chloride infusion   Intravenous Continuous Callwood, Dwayne D, MD 20 mL/hr at 10/18/18 0758    . acetaminophen (TYLENOL) tablet 650 mg  650 mg Oral Q6H PRN Lovell Sheehan, MD   650 mg at 10/17/18 8841   Or  . acetaminophen (TYLENOL) suppository 650 mg  650 mg Rectal Q6H PRN Lovell Sheehan, MD      . ALPRAZolam Duanne Moron) tablet 0.5 mg  0.5 mg Oral TID PRN Harrie Foreman, MD   0.5 mg at 10/18/18 0800  . buPROPion (WELLBUTRIN XL) 24 hr tablet 300 mg  300 mg Oral Daily Vaughan Basta, MD   300 mg at 10/18/18 1142  . butamben-tetracaine-benzocaine (CETACAINE) 05-26-12 % spray           . diazepam (VALIUM) tablet 1 mg  1 mg Oral BID Lovell Sheehan, MD   1 mg at 10/18/18 1139  . docusate sodium (COLACE) capsule 100 mg  100 mg Oral BID Lovell Sheehan, MD   100 mg at 10/18/18 1142  . escitalopram (LEXAPRO) tablet 30 mg  30 mg Oral Daily Lovell Sheehan, MD   30 mg at 10/18/18 1143  . fentaNYL (SUBLIMAZE) 100 MCG/2ML injection           . fentaNYL (SUBLIMAZE) 100 MCG/2ML injection           . fluticasone (FLONASE) 50 MCG/ACT nasal spray 1 spray  1 spray Each Nare Daily Lovell Sheehan, MD   1 spray at 10/17/18 0920  . ketorolac (TORADOL) 15 MG/ML injection 15 mg  15 mg Intravenous Q8H Lovell Sheehan, MD   15 mg at 10/18/18 0559  . lidocaine (XYLOCAINE) 2 % viscous mouth solution           . metoCLOPramide (REGLAN) tablet 5 mg  5 mg  Oral Q8H PRN Lovell Sheehan, MD       Or  . metoCLOPramide (REGLAN) injection 5 mg  5 mg Intravenous Q8H PRN Lovell Sheehan, MD   5 mg at 10/18/18 0246  . midazolam (VERSED) 2 MG/2ML injection           . midazolam (VERSED) 5 MG/5ML injection           . morphine 2 MG/ML injection 2 mg  2 mg Intravenous Q4H PRN Lovell Sheehan, MD   2 mg at 10/18/18 0506  . Nafcillin 12g in sodium chloride 0.9% 524m infusion   Intravenous Q24H RTsosie Billing MD 21 mL/hr at 10/17/18 1727    . ondansetron (ZOFRAN) tablet 4 mg  4 mg Oral Q6H PRN BLovell Sheehan MD       Or  . ondansetron ([facility] injection 4 mg  4 mg Intravenous Q6H PRN BLovell Sheehan MD   4 mg at 10/18/18 0151  . oxyCODONE (Oxy IR/ROXICODONE) immediate release tablet 5 mg  5 mg Oral Q4H PRN BLovell Sheehan MD   5 mg at 10/18/18 1139  . pantoprazole (PROTONIX) EC tablet 40 mg  40 mg Oral BID BLovell Sheehan MD   40 mg at 10/18/18 1144  . sodium chloride flush (NS) 0.9 % injection 10-40 mL  10-40 mL Intracatheter Q12H TAlbertine Patricia DPM   10 mL at 10/17/18 2218  . sodium chloride flush (NS) 0.9 % injection 10-40 mL  10-40  mL Intracatheter PRN Troxler, Rodman Key, DPM      . ziprasidone (GEODON) injection 10 mg  10 mg Intramuscular Q6H PRN Lovell Sheehan, MD   10 mg at 10/15/18 0224    Musculoskeletal: Strength & Muscle Tone: within normal limits Gait & Station: normal Patient leans: N/A  Psychiatric Specialty Exam: Physical Exam  Nursing note and vitals reviewed. Constitutional: She is oriented to person, place, and time. She appears well-developed and well-nourished.  HENT:  Head: Normocephalic.  Neck: Normal range of motion.  Respiratory: Effort normal.  Musculoskeletal:     Comments: Left knee issues  Neurological: She is alert and oriented to person, place, and time.  Psychiatric: Her speech is normal and behavior is normal. Judgment normal. Her mood appears anxious. Cognition and memory are normal.    Review of  Systems  Musculoskeletal:       Left knee pain   Psychiatric/Behavioral: Positive for substance abuse. The patient is nervous/anxious.   All other systems reviewed and are negative.   Blood pressure (!) 154/94, pulse 77, temperature 98.2 F (36.8 C), resp. rate 11, height _0  (1.626 m), weight 80.7 kg, last menstrual period 01/22/2017, SpO2 97 %.Body mass index is 30.54 kg/m.  General Appearance: Casual  Eye Contact:  Good  Speech:  Normal Rate  Volume:  Normal  Mood:  Anxious  Affect:  Congruent  Thought Process:  Coherent and Descriptions of Associations: Intact  Orientation:  Full (Time, Place, and Person)  Thought Content:  WDL and Logical  Suicidal Thoughts:  No  Homicidal Thoughts:  No  Memory:  Immediate;   Good Recent;   Good Remote;   Good  Judgement:  Good  Insight:  Good  Psychomotor Activity:  Decreased  Concentration:  Concentration: Good and Attention Span: Good  Recall:  Good  Fund of Knowledge:  Good  Language:  Good  Akathisia:  No  Handed:  Right  AIMS (if indicated):     Assets:  Housing Leisure Time Physical Health Resilience Social Support  ADL's:  Intact  Cognition:  WNL  Sleep:        Treatment Plan Summary: Substance abuse: -Recommended rehab, short and long term rehab and/or outpatient resources but patient continues to deny she has substance abuse issues -Dr Dwyane Dee consulted and concurs with the plan  Anxiety: -Recommend the use of hydroxyzine 10 mg TID PRN  Disposition: No evidence of imminent risk to self or others at present.   Patient does not meet criteria for psychiatric inpatient admission. Supportive therapy provided about ongoing stressors.  Waylan Boga, NP 10/18/2018 1:31 PM

## 2018-10-18 NOTE — Transfer of Care (Signed)
Immediate Anesthesia Transfer of Care Note  Patient: [name]  Procedure(s) Performed: TRANSESOPHAGEAL ECHOCARDIOGRAM (TEE) (N/A )  Patient Location: PACU  Anesthesia Type:General  Level of Consciousness: sedated  Airway & Oxygen Therapy: Patient Spontanous Breathing and Patient connected to nasal cannula oxygen  Post-op Assessment: Report given to RN, Post -op Vital signs reviewed and stable and Patient moving all extremities X 4  Post vital signs: Reviewed and stable  Last Vitals:  Vitals Value Taken Time  BP 137/71 10/18/18 0849  Temp    Pulse 89 10/18/18 0850  Resp 31 10/18/18 0850  SpO2 95 % 10/18/18 0850  Vitals shown include unvalidated device data.  Last Pain:  Vitals:   10/18/18 0822  TempSrc: Oral  PainSc:       Patients Stated Pain Goal: 0 (21/11/73 5670)  Complications: No apparent anesthesia complications

## 2018-10-19 LAB — COMPREHENSIVE METABOLIC PANEL
ALT: 15 U/L (ref 0–44)
AST: 20 U/L (ref 15–41)
Albumin: 1.8 g/dL — ABNORMAL LOW (ref 3.5–5.0)
Alkaline Phosphatase: 312 U/L — ABNORMAL HIGH (ref 38–126)
Anion gap: 8 (ref 5–15)
BUN: 5 mg/dL — ABNORMAL LOW (ref 6–20)
CO2: 26 mmol/L (ref 22–32)
Calcium: 7.5 mg/dL — ABNORMAL LOW (ref 8.9–10.3)
Chloride: 100 mmol/L (ref 98–111)
Creatinine, Ser: 0.56 mg/dL (ref 0.44–1.00)
GFR calc Af Amer: >60 mL/min (ref >60)
GFR calc non Af Amer: >60 mL/min (ref >60)
Glucose, Bld: 96 mg/dL (ref 70–99)
Potassium: 3.1 mmol/L — ABNORMAL LOW (ref 3.5–5.1)
Sodium: 134 mmol/L — ABNORMAL LOW (ref 135–145)
Total Bilirubin: 1.4 mg/dL — ABNORMAL HIGH (ref 0.3–1.2)
Total Protein: 5.2 g/dL — ABNORMAL LOW (ref 6.5–8.1)

## 2018-10-19 LAB — CBC
HCT: 24.5 % — ABNORMAL LOW (ref 36.0–46.0)
Hemoglobin: 8.1 g/dL — ABNORMAL LOW (ref 12.0–15.0)
MCH: 30.8 pg (ref 26.0–34.0)
MCHC: 33.1 g/dL (ref 30.0–36.0)
MCV: 93.2 fL (ref 80.0–100.0)
Platelets: 645 10*3/uL — ABNORMAL HIGH (ref 150–400)
RBC: 2.63 MIL/uL — ABNORMAL LOW (ref 3.87–5.11)
RDW: 15.5 % (ref 11.5–15.5)
WBC: 16.5 10*3/uL — ABNORMAL HIGH (ref 4.0–10.5)
nRBC: 0 % (ref 0.0–0.2)

## 2018-10-19 LAB — AEROBIC/ANAEROBIC CULTURE W GRAM STAIN (SURGICAL/DEEP WOUND)

## 2018-10-19 MED ORDER — POTASSIUM CHLORIDE CRYS ER 20 MEQ PO TBCR
40.0000 meq | EXTENDED_RELEASE_TABLET | Freq: Once | ORAL | Status: AC
  Administered 2018-10-19: 11:00:00 40 meq via ORAL
  Filled 2018-10-19: qty 2

## 2018-10-19 NOTE — Progress Notes (Signed)
[facility] NAME: [name]    MR#:  [id number]  DATE OF BIRTH:  July 01, 1965  SUBJECTIVE:   Patient still complaining of some back pain but is improved since yesterday. No other complaints.  IR attempted a paraspinous abscess drainage yesterday but due to patient's agitation they were unsuccessful.  Mental status is much improved today.  REVIEW OF SYSTEMS:    Review of Systems  Constitutional: Negative for chills and fever.  HENT: Negative for congestion and tinnitus.   Eyes: Negative for blurred vision and double vision.  Respiratory: Negative for cough, shortness of breath and wheezing.   Cardiovascular: Negative for chest pain, orthopnea and PND.  Gastrointestinal: Negative for abdominal pain, diarrhea, nausea and vomiting.  Genitourinary: Negative for dysuria and hematuria.  Musculoskeletal: Positive for back pain.  Neurological: Negative for dizziness, sensory change and focal weakness.  All other systems reviewed and are negative.   Nutrition: Regular Tolerating Diet: Yes Tolerating PT: Await Eval.   DRUG ALLERGIES:   Allergies  Allergen Reactions   Topiramate Hives    VITALS:  Blood pressure (!) 141/75, pulse 76, temperature 97.8 F (36.6 C), temperature source Oral, resp. rate 18, height 5\' 4"  (1.626 m), weight 80.7 kg, last menstrual period 01/22/2017, SpO2 93 %.  PHYSICAL EXAMINATION:   Physical Exam  GENERAL:  53 y.o.-year-old patient lying in bed in no acute distress.  EYES: Pupils equal, round, reactive to light and accommodation. No scleral icterus. Extraocular muscles intact.  HEENT: Head atraumatic, normocephalic. Oropharynx and nasopharynx clear.  NECK:  Supple, no jugular venous distention. No thyroid enlargement, no tenderness.  LUNGS: Normal breath sounds bilaterally, no wheezing, rales, rhonchi. No use of accessory muscles of respiration.  CARDIOVASCULAR: S1, S2 normal. No murmurs, rubs, or  gallops.  ABDOMEN: Soft, nontender, nondistended. Bowel sounds present. No organomegaly or mass.  EXTREMITIES: No cyanosis, clubbing or edema b/l.    NEUROLOGIC: Cranial nerves II through XII are intact. No focal Motor or sensory deficits b/l. Globally weak.   PSYCHIATRIC: The patient is alert and oriented x 3.  SKIN: No obvious rash, lesion, or ulcer.    LABORATORY PANEL:   CBC Recent Labs  Lab 10/19/18 0400  WBC 16.5*  HGB 8.1*  HCT 24.5*  PLT 645*   ------------------------------------------------------------------------------------------------------------------  Chemistries  Recent Labs  Lab 10/13/18 1419  10/19/18 0400  NA 127*   < > 134*  K 2.8*   < > 3.1*  CL 83*   < > 100  CO2 31   < > 26  GLUCOSE 159*   < > 96  BUN 28*   < > 5*  CREATININE 1.01*   < > 0.56  CALCIUM 7.8*   < > 7.5*  MG 2.6*  --   --   AST 88*   < > 20  ALT 47*   < > 15  ALKPHOS 759*   < > 312*  BILITOT 3.8*   < > 1.4*   < > = values in this interval not displayed.   ------------------------------------------------------------------------------------------------------------------  Cardiac Enzymes Recent Labs  Lab 10/13/18 1419  TROPONINI 0.05*   ------------------------------------------------------------------------------------------------------------------  RADIOLOGY:  Ct Chest W Contrast  Result Date: 10/17/2018 CLINICAL DATA:  Epidural abscess. EXAM: CT CHEST WITH CONTRAST TECHNIQUE: Multidetector CT imaging of the chest was performed during intravenous contrast administration. CONTRAST:  63mL OMNIPAQUE IOHEXOL 300 MG/ML  SOLN COMPARISON:  None. FINDINGS: Cardiovascular: Heart size upper normal. No  substantial pericardial effusion. No thoracic aortic aneurysm. Mediastinum/Nodes: No mediastinal lymphadenopathy. There is no hilar lymphadenopathy. There is no axillary lymphadenopathy. The esophagus has normal imaging features. Lungs/Pleura: 5 mm right middle lobe pulmonary nodule visible  on image 65/series 3. Bilateral lower lobe collapse/consolidation noted, left greater than right. Small bilateral pleural effusions evident. Upper Abdomen: Unremarkable. Musculoskeletal: No worrisome lytic or sclerotic osseous abnormality. As noted on recent MRI, a right paraspinal fluid collection is identified in the lower thoracic region, extending from approximately T9 down to T11 measuring about 1.2 x 4.0 x 4.7 cm. IMPRESSION: 1. Right paraspinal fluid collection extends from T9 down to the T11 level. This is not as well demonstrated by CT as it was on the recent MRI. 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 2. Bilateral lower lobe collapse/consolidation, left greater than right with small bilateral pleural effusions. 3. 5 mm right middle lobe pulmonary nodule. No follow-up needed if patient is low-risk. Non-contrast chest CT can be considered in 12 months if patient is high-risk. This recommendation follows the consensus statement: Guidelines for Management of Incidental Pulmonary Nodules Detected on CT Images: From the Fleischner Society 2017; Radiology 2017; 284:228-243. Electronically Signed   By: Misty Stanley M.D.   On: 10/17/2018 18:16   Ct Chest Limited Wo Contrast  Result Date: 10/18/2018 INDICATION: History of right-sided paraspinal fluid collection at the T9-T11 level. Please perform CT-guided aspiration for identification of infectious agent. EXAM: CT CHEST LIMITED WITHOUT CONTRAST COMPARISON:  Chest CT-10/17/2018; thoracic spine MRI-10/16/2018 MEDICATIONS: The patient is currently admitted to the hospital and receiving intravenous antibiotics. The antibiotics were administered within an appropriate time frame prior to the initiation of the procedure. ANESTHESIA/SEDATION: Moderate (conscious) sedation was employed during this procedure. A total of Versed 2 mg and Fentanyl 75 mcg was  administered intravenously. Moderate Sedation Time: 15 minutes. The patient's level of consciousness and vital signs were monitored continuously by radiology nursing throughout the procedure under my direct supervision. CONTRAST:  None COMPLICATIONS: None immediate. PROCEDURE: Informed written consent was obtained from the patient's sister after a discussion of the risks, benefits and alternatives to treatment. The patient was placed prone on the CT gantry and a pre procedural CT was performed re-demonstrating the known abscess/fluid collection within the right paraspinal space adjacent to the T9-T11 vertebral bodies with dominant ill-defined collection measuring approximately 4.0 x 1.5 cm (image 21, series 2). The procedure was planned. A timeout was performed prior to the initiation of the procedure. Despite the administration of conscious sedation medicines, the patient was unable to maintain positioning to allow for safe CT-guided paraspinal fluid collection aspiration. As such, no procedure was attempted. IMPRESSION: No attempt at CT-guided aspiration of ill-defined right-sided paraspinal fluid collection secondary to patient's inability to tolerate positioning on the table and/or cooperate with instructions to remain still despite the administration of conscious sedation. Above findings discussed with Dr. Anselm Jungling at the time of procedure cancellation. Electronically Signed   By: Sandi Mariscal M.D.   On: 10/18/2018 12:50     ASSESSMENT AND PLAN:   53 year old female with past medical history of anxiety/depression, chronic back pain, osteoarthritis who presented to the hospital due to suspected sepsis.  1.  Sepsis- patient presented to the hospital with fever, tachycardia and leukocytosis. -Source of sepsis is disseminated staph infection from underlying paraspinous fluid collection from paraspinous abscess/discitis. -TEE was negative for endocarditis.  Continue IV nafcillin. -Appreciate  infectious  disease input.  2.  Discitis/paraspinous abscess- patient underwent a MRI of the thoracic spine which showed heterogeneous paraspinous abscess at the level of T9-T10. -Continue IV nafcillin.  IR attempted drainage of the abscess but due to patient's agitation it was unsuccessful. - We will have interventional radiology reattempt this early next week.  3.  MSSA bacteremia-secondary to #2. -Continue IV nafcillin.  As mentioned TEE was negative for endocarditis.  Repeat cultures are currently negative.  ID is following.  4.  Anxiety/depression-continue Wellbutrin, Xanax.  5.  GERD-continue Protonix.  6.  Hypokalemia-will supplement orally and repeat level in the morning.  Check magnesium level.     All the records are reviewed and case discussed with Care Management/Social Worker. Management plans discussed with the patient, family and they are in agreement.  CODE STATUS: Full code  DVT Prophylaxis: Ted's & SCD's.   TOTAL TIME TAKING CARE OF THIS PATIENT: 30 minutes.   POSSIBLE D/C unclear DAYS, DEPENDING ON CLINICAL CONDITION and progress.   Henreitta Leber M.D on 10/19/2018 at 2:26 PM  Between 7am to 6pm - Pager - [phone]  After 6pm go to [email or web address] - password [email or web address] [facility]  Office  [phone]  CC: Primary care physician; Marguerita Merles, MD

## 2018-10-19 NOTE — Progress Notes (Signed)
Pt complains of inability to move left knee as she does right knee. No redness sutures intact. Knee does feel slightly warmer to touch on right lateral aspect of left knee than does on outer aspect of left knee or does on right knee.

## 2018-10-20 LAB — POTASSIUM: Potassium: 2.7 mmol/L — CL (ref 3.5–5.1)

## 2018-10-20 LAB — MAGNESIUM: Magnesium: 1.7 mg/dL (ref 1.7–2.4)

## 2018-10-20 MED ORDER — POTASSIUM CHLORIDE 10 MEQ/100ML IV SOLN: 10.0000 meq | INTRAVENOUS | Status: DC

## 2018-10-20 MED ORDER — POTASSIUM CHLORIDE CRYS ER 20 MEQ PO TBCR
40.0000 meq | EXTENDED_RELEASE_TABLET | Freq: Once | ORAL | Status: AC
  Administered 2018-10-20: 22:00:00 40 meq via ORAL
  Filled 2018-10-20: qty 2

## 2018-10-20 MED ORDER — FENTANYL 12 MCG/HR TD PT72
1.0000 | MEDICATED_PATCH | TRANSDERMAL | Status: DC
  Administered 2018-10-20 – 2018-10-28 (×4): 1 via TRANSDERMAL
  Filled 2018-10-20 (×5): qty 1

## 2018-10-20 MED ORDER — MAGNESIUM SULFATE 2 GM/50ML IV SOLN
2.0000 g | Freq: Once | INTRAVENOUS | Status: AC
  Administered 2018-10-20: 2 g via INTRAVENOUS
  Filled 2018-10-20: qty 50

## 2018-10-20 NOTE — Plan of Care (Signed)
  Problem: Education: Goal: Knowledge of General Education information will improve Description: Including pain rating scale, medication(s)/side effects and non-pharmacologic comfort measures Outcome: Progressing   Problem: Clinical Measurements: Goal: Ability to maintain clinical measurements within normal limits will improve Outcome: Progressing   Problem: Pain Managment: Goal: General experience of comfort will improve Outcome: Progressing   Problem: Safety: Goal: Ability to remain free from injury will improve Outcome: Progressing   Problem: Skin Integrity: Goal: Risk for impaired skin integrity will decrease Outcome: Progressing   Problem: Clinical Measurements: Goal: Signs and symptoms of infection will decrease Outcome: Progressing

## 2018-10-20 NOTE — Consult Note (Addendum)
PHARMACY CONSULT NOTE - FOLLOW UP  Pharmacy Consult for Electrolyte Monitoring and Replacement   Recent Labs: Potassium (mmol/L)  Date Value  10/20/2018 2.7 (LL)  06/02/2011 3.8   Magnesium (mg/dL)  Date Value  10/20/2018 1.7   Calcium (mg/dL)  Date Value  10/19/2018 7.5 (L)   Calcium, Total (mg/dL)  Date Value  06/02/2011 9.1   Albumin (g/dL)  Date Value  10/19/2018 1.8 (L)  11/27/2016 3.5  06/02/2011 3.8   Sodium (mmol/L)  Date Value  10/19/2018 134 (L)  11/27/2016 141  06/02/2011 140  Corrected Ca: 9.26   Assessment: Patient presents hypokalemic and slightly hyomagnesemia.    K 3.1 >> 2.7  Mg 1.7 >>   Medications related to electrolyte imbalance: KCL 40 mEq x1 dose PO on 6/27  Goal of Therapy:  Electrolytes WNL   Plan:  Will order KCL PO 40 mEq x 1 dose and KCL IV 10 mEq x 4 runs. Will recheck K @ 0200.   Will order magnesium 2 g x1 dose.   Will follow electrolytes with AM labs.   Rowland Lathe ,PharmD Clinical Pharmacist 10/20/2018 8:00 PM

## 2018-10-20 NOTE — Progress Notes (Signed)
[facility] [place] NAME: [name]    MR#:  [id number]  DATE OF BIRTH:  [birth year]  SUBJECTIVE:   Still having some back pain and now also complaining of some leg pain near her knee on the left side.  No other acute events overnight.  Patient's mental status is stable.  REVIEW OF SYSTEMS:    Review of Systems  Constitutional: Negative for chills and fever.  HENT: Negative for congestion and tinnitus.   Eyes: Negative for blurred vision and double vision.  Respiratory: Negative for cough, shortness of breath and wheezing.   Cardiovascular: Negative for chest pain, orthopnea and PND.  Gastrointestinal: Negative for abdominal pain, diarrhea, nausea and vomiting.  Genitourinary: Negative for dysuria and hematuria.  Musculoskeletal: Positive for back pain.  Neurological: Negative for dizziness, sensory change and focal weakness.  All other systems reviewed and are negative.   Nutrition: Regular Tolerating Diet: Yes Tolerating PT: Await Eval.   DRUG ALLERGIES:   Allergies  Allergen Reactions  . Topiramate Hives    VITALS:  Blood pressure 135/76, pulse 76, temperature 98.6 F (37 C), resp. rate 17, height 5\' 4"  (1.626 m), weight 80.7 kg, last menstrual period 01/22/2017, SpO2 (!) 89 %.  PHYSICAL EXAMINATION:   Physical Exam  GENERAL:  53 y.o.-year-old patient lying in bed in no acute distress.  EYES: Pupils equal, round, reactive to light and accommodation. No scleral icterus. Extraocular muscles intact.  HEENT: Head atraumatic, normocephalic. Oropharynx and nasopharynx clear.  NECK:  Supple, no jugular venous distention. No thyroid enlargement, no tenderness.  LUNGS: Normal breath sounds bilaterally, no wheezing, rales, rhonchi. No use of accessory muscles of respiration.  CARDIOVASCULAR: S1, S2 normal. No murmurs, rubs, or gallops.  ABDOMEN: Soft, nontender, nondistended. Bowel sounds present. No organomegaly or mass.   EXTREMITIES: No cyanosis, clubbing or edema b/l.    NEUROLOGIC: Cranial nerves II through XII are intact. No focal Motor or sensory deficits b/l. Globally weak.   PSYCHIATRIC: The patient is alert and oriented x 3.  SKIN: No obvious rash, lesion, or ulcer.    LABORATORY PANEL:   CBC Recent Labs  Lab 10/19/18 0400  WBC 16.5*  HGB 8.1*  HCT 24.5*  PLT 645*   ------------------------------------------------------------------------------------------------------------------  Chemistries  Recent Labs  Lab 10/19/18 0400 10/20/18 0553  NA 134*  --   K 3.1* 2.7*  CL 100  --   CO2 26  --   GLUCOSE 96  --   BUN 5*  --   CREATININE 0.56  --   CALCIUM 7.5*  --   MG  --  1.7  AST 20  --   ALT 15  --   ALKPHOS 312*  --   BILITOT 1.4*  --    ------------------------------------------------------------------------------------------------------------------  Cardiac Enzymes Recent Labs  Lab 10/13/18 1419  TROPONINI 0.05*   ------------------------------------------------------------------------------------------------------------------  RADIOLOGY:  No results found.   ASSESSMENT AND PLAN:   53 year old female with past medical history of anxiety/depression, chronic back pain, osteoarthritis who presented to the hospital due to suspected sepsis.  1.  Sepsis- patient presented to the hospital with fever, tachycardia and leukocytosis. -Source of sepsis is disseminated staph infection from underlying paraspinous fluid collection from paraspinous abscess/discitis and also left knee septic arthritis.  -TEE was negative for endocarditis.  Continue IV nafcillin. -Appreciate infectious disease input.  2.  Discitis/paraspinous abscess- patient underwent a MRI of the thoracic spine which showed heterogeneous paraspinous abscess at the  level of T9-T10. -Continue IV nafcillin.  IR attempted drainage of the abscess but due to patient's agitation it was unsuccessful. - We will have  interventional radiology reattempt this early next week.  3.  MSSA bacteremia-secondary to #2. -Continue IV nafcillin.  As mentioned TEE was negative for endocarditis.  Repeat cultures on 6/24 are currently negative.  ID is following. - ?? Need to discuss with ID regarding longevity of abx.   4. Left knee septic Arthritis/left toe infection - s/p washout and I & D and currently stable.  - cont. abx as mentioned above with nafcillin.   5.  Anxiety/depression-continue Wellbutrin, Xanax.  6.  GERD-continue Protonix.  7.  Hypokalemia-K was down to 2.7 and will cont. To supplement orally and intravenously.  - Mg. Level was normal.     All the records are reviewed and case discussed with Care Management/Social Worker. Management plans discussed with the patient, family and they are in agreement.  CODE STATUS: Full code  DVT Prophylaxis: Ted's & SCD's.   TOTAL TIME TAKING CARE OF THIS PATIENT: 30 minutes.   POSSIBLE D/C unclear DAYS, DEPENDING ON CLINICAL CONDITION and progress.   Henreitta Leber M.D on 10/20/2018 at 12:26 PM  Between 7am to 6pm - Pager - [phone]  After 6pm go to [email or web address] - password [email or web address]  Office  [phone]  CC: Primary care physician; Marguerita Merles, MD

## 2018-10-20 NOTE — Progress Notes (Signed)
CRITICAL VALUE ALERT  Critical Value:  K+ 2.7    Date & Time Notied:  6.57 am 10/20/2018  Provider Notified: Bobetta Lime, MD and M. Marcille Blanco, MD  Orders Received/Actions taken:awaiting response

## 2018-10-20 NOTE — Progress Notes (Signed)
Went in patient's room to check on her and she had a family member or friend in room visiting.  This lady spoke with me, letting me know that patient has head lice, pediculosis.  She stated that patient's two sons, whom she lives with have had active infestations as well, without success at being treated.  This lady was in room, brushing her hair when I walked into the room.

## 2018-10-21 ENCOUNTER — Encounter: Payer: Self-pay | Admitting: Internal Medicine

## 2018-10-21 DIAGNOSIS — M4624 Osteomyelitis of vertebra, thoracic region: Secondary | ICD-10-CM

## 2018-10-21 DIAGNOSIS — F1410 Cocaine abuse, uncomplicated: Secondary | ICD-10-CM

## 2018-10-21 LAB — POTASSIUM: Potassium: 3.4 mmol/L — ABNORMAL LOW (ref 3.5–5.1)

## 2018-10-21 LAB — CULTURE, BLOOD (ROUTINE X 2)
Culture: NO GROWTH
Culture: NO GROWTH
Special Requests: ADEQUATE
Special Requests: ADEQUATE

## 2018-10-21 LAB — BASIC METABOLIC PANEL
Anion gap: 10 (ref 5–15)
BUN: <5 mg/dL — ABNORMAL LOW (ref 6–20)
CO2: 27 mmol/L (ref 22–32)
Calcium: 7.6 mg/dL — ABNORMAL LOW (ref 8.9–10.3)
Chloride: 97 mmol/L — ABNORMAL LOW (ref 98–111)
Creatinine, Ser: 0.48 mg/dL (ref 0.44–1.00)
GFR calc Af Amer: >60 mL/min (ref >60)
GFR calc non Af Amer: >60 mL/min (ref >60)
Glucose, Bld: 97 mg/dL (ref 70–99)
Potassium: 2.4 mmol/L — CL (ref 3.5–5.1)
Sodium: 134 mmol/L — ABNORMAL LOW (ref 135–145)

## 2018-10-21 LAB — CBC
HCT: 26.5 % — ABNORMAL LOW (ref 36.0–46.0)
Hemoglobin: 8.6 g/dL — ABNORMAL LOW (ref 12.0–15.0)
MCH: 30.3 pg (ref 26.0–34.0)
MCHC: 32.5 g/dL (ref 30.0–36.0)
MCV: 93.3 fL (ref 80.0–100.0)
Platelets: 706 10*3/uL — ABNORMAL HIGH (ref 150–400)
RBC: 2.84 MIL/uL — ABNORMAL LOW (ref 3.87–5.11)
RDW: 15.2 % (ref 11.5–15.5)
WBC: 14.6 10*3/uL — ABNORMAL HIGH (ref 4.0–10.5)
nRBC: 0 % (ref 0.0–0.2)

## 2018-10-21 LAB — PHOSPHORUS: Phosphorus: 3.6 mg/dL (ref 2.5–4.6)

## 2018-10-21 LAB — MAGNESIUM: Magnesium: 2.4 mg/dL (ref 1.7–2.4)

## 2018-10-21 MED ORDER — SODIUM CHLORIDE 0.9 % IV SOLN
INTRAVENOUS | Status: DC
  Administered 2018-10-23: 02:00:00 via INTRAVENOUS

## 2018-10-21 MED ORDER — PERMETHRIN 5 % EX CREA
TOPICAL_CREAM | Freq: Once | CUTANEOUS | Status: AC
  Administered 2018-10-21: 08:00:00 via TOPICAL
  Filled 2018-10-21: qty 60

## 2018-10-21 MED ORDER — POTASSIUM CHLORIDE CRYS ER 20 MEQ PO TBCR
20.0000 meq | EXTENDED_RELEASE_TABLET | Freq: Three times a day (TID) | ORAL | Status: AC
  Administered 2018-10-21 – 2018-10-22 (×6): 20 meq via ORAL
  Filled 2018-10-21 (×6): qty 1

## 2018-10-21 MED ORDER — POTASSIUM CHLORIDE CRYS ER 20 MEQ PO TBCR: 40.0000 meq | EXTENDED_RELEASE_TABLET | ORAL | Status: DC

## 2018-10-21 MED ORDER — POTASSIUM CHLORIDE CRYS ER 20 MEQ PO TBCR
20.0000 meq | EXTENDED_RELEASE_TABLET | Freq: Once | ORAL | Status: AC
  Administered 2018-10-21: 20 meq via ORAL
  Filled 2018-10-21: qty 1

## 2018-10-21 MED ORDER — POTASSIUM CHLORIDE 10 MEQ/100ML IV SOLN
10.0000 meq | INTRAVENOUS | Status: AC
  Administered 2018-10-21 (×6): 10 meq via INTRAVENOUS
  Filled 2018-10-21 (×6): qty 100

## 2018-10-21 MED ORDER — LOPERAMIDE HCL 2 MG PO CAPS
2.0000 mg | ORAL_CAPSULE | Freq: Four times a day (QID) | ORAL | Status: DC | PRN
  Administered 2018-10-21 – 2018-10-24 (×5): 2 mg via ORAL
  Filled 2018-10-21 (×5): qty 1

## 2018-10-21 MED ORDER — POTASSIUM CHLORIDE CRYS ER 20 MEQ PO TBCR
40.0000 meq | EXTENDED_RELEASE_TABLET | Freq: Once | ORAL | Status: AC
  Administered 2018-10-21: 03:00:00 40 meq via ORAL
  Filled 2018-10-21: qty 2

## 2018-10-21 NOTE — Progress Notes (Signed)
CRITICAL VALUE ALERT  Critical Value:  *k+ of 2.4  Date & Time Notied: 10/21/2018 0300  Provider Notified: M. Daimond, MD notified  Orders Received/Actions taken: K+ replacement ordered per Margarita Grizzle, Pharmacist. MD aware no further orders at this time

## 2018-10-21 NOTE — Progress Notes (Signed)
   Date of Admission:  10/13/2018       Subjective: Feeling better  Medications:  . buPROPion  300 mg Oral Daily  . diazepam  1 mg Oral BID  . docusate sodium  100 mg Oral BID  . escitalopram  30 mg Oral Daily  . fentaNYL  1 patch Transdermal Q72H  . fluticasone  1 spray Each Nare Daily  . pantoprazole  40 mg Oral BID  . potassium chloride  20 mEq Oral TID  . sodium chloride flush  10-40 mL Intracatheter Q12H    Objective: Vital signs in last 24 hours: Temp:  [98.1 F (36.7 C)] 98.1 F (36.7 C) (06/28 2234) Pulse Rate:  [70-83] 83 (06/29 0643) Resp:  [16-20] 16 (06/28 2234) BP: (138-156)/(72-92) 156/92 (06/29 0643) SpO2:  [92 %-96 %] 96 % (06/29 0643)  PHYSICAL EXAM:  General: Alert, cooperative, no distress, appears stated age.  Lungs: Clear to auscultation bilaterally. No Wheezing or Rhonchi. No rales. Heart: Regular rate and rhythm, no murmur, rub or gallop. Abdomen: Soft, non-tender,not distended. Bowel sounds normal. No masses Extremities:left 3rd toe= 10/21/18   10/13/18   Skin: No rashes or lesions. Or bruising Lymph: Cervical, supraclavicular normal. Neurologic: Grossly non-focal  Lab Results Recent Labs    10/19/18 0400 10/20/18 0553 10/21/18 0147  WBC 16.5*  --  14.6*  HGB 8.1*  --  8.6*  HCT 24.5*  --  26.5*  NA 134*  --  134*  K 3.1* 2.7* 2.4*  CL 100  --  97*  CO2 26  --  27  BUN 5*  --  <5*  CREATININE 0.56  --  0.48   Liver Panel Recent Labs    10/19/18 0400  PROT 5.2*  ALBUMIN 1.8*  AST 20  ALT 15  ALKPHOS 312*  BILITOT 1.4*   Sedimentation Rate No results for input(s): ESRSEDRATE in the last 72 hours. C-Reactive Protein No results for input(s): CRP in the last 72 hours.  Microbiology:  Studies/Results: No results found.   Assessment/Plan: 53 year old female with history of substance use presents with left knee pain, left toe pain and purplish discoloration and also lethargy. Also has been having back pain especially  neck pain for which she had 2 prior ED visits on 6/10 and 10/04/2018.  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 There is a heterogeneous paraspinous abscess at the level of T9-10 that measures 3.0 x 1.4 x 4.6 cm. Drain placement  could not be done by IR because of movt.  we need to reduce the bioburden. Going for procedure tomorrow  Abnormal LFTs-HIV and hepatitis panel negative AST more than ALT.high alkaline phosphataseimproving-ultrasound GB no stone  Pt abuses cocaine , but denies IVDA/  There is a concern to send her home with PICC . Discussed the management with Dr.sainani

## 2018-10-21 NOTE — Consult Note (Signed)
PHARMACY CONSULT NOTE - FOLLOW UP  Pharmacy Consult for Electrolyte Monitoring and Replacement   Recent Labs: Potassium (mmol/L)  Date Value  10/21/2018 3.4 (L)  06/02/2011 3.8   Magnesium (mg/dL)  Date Value  10/21/2018 2.4   Calcium (mg/dL)  Date Value  10/21/2018 7.6 (L)   Calcium, Total (mg/dL)  Date Value  06/02/2011 9.1   Albumin (g/dL)  Date Value  10/19/2018 1.8 (L)  11/27/2016 3.5  06/02/2011 3.8   Phosphorus (mg/dL)  Date Value  10/21/2018 3.6   Sodium (mmol/L)  Date Value  10/21/2018 134 (L)  11/27/2016 141  06/02/2011 140   Corrected Ca: 9.36  Assessment: Pharmacy consulted for electrolyte monitoring and replacement in 53 yo female admitted with sepsis secondary to MSSA bacteremia.   Goal of Therapy:  Electrolytes WNL   Plan:  6/29 1135 K 3.4. Patient has order for potassium 20 mEq PO TID, which will give her an additional 40 mEq scheduled for today.  Will order potassium for tomorrow morning and reassess need for additional replacement.  Pharmacy will continue to monitor and replace electrolytes as needed.   Tawnya Crook, PharmD Clinical Pharmacist 10/21/2018 1:06 PM

## 2018-10-21 NOTE — Progress Notes (Signed)
Physical Therapy Treatment Patient Details Name: [name] MRN: [id number] DOB: 1966/03/02 Today's Date: 10/21/2018    History of Present Illness 53 y/o arrives with swollen L knee and toes, sepsis.  S/p I&Ds 6/24.      PT Comments    Patient has swelling around L middle toe DIP/PIP and would prefer dressing on L toes prior to donning socks and ambulating which RN states will be some time. Patient agreeable to ROM and strengthening exercises in bed, which she is able to perform independently. She does demonstrate L quadriceps weakness throughout activities, and is generally hesitant to extend her LLE. She is due for a procedure tomorrow and given her previous performance with therapy would still be most appropriate for SNF placement.   Follow Up Recommendations  SNF     Equipment Recommendations  Rolling walker with 5" wheels    Recommendations for Other Services       Precautions / Restrictions Precautions Precautions: Fall Restrictions Weight Bearing Restrictions: Yes LLE Weight Bearing: Weight bearing as tolerated    Mobility  Bed Mobility Overal bed mobility: Independent             General bed mobility comments: Patient able to transfer with [facility] elevated with no deficits.  Transfers                 General transfer comment: Patient declined due to inflammation on L middle toe and needing dressing before donning sock. Discussed with RN, who stated it would be a while before a dressing would be put on.  Ambulation/Gait                 Stairs             Wheelchair Mobility    Modified Rankin (Stroke Patients Only)       Balance Overall balance assessment: No apparent balance deficits (not formally assessed) Sitting-balance support: Bilateral upper extremity supported Sitting balance-Leahy Scale: Good         Standing balance comment: reliant on walker, poor tolerance                            Cognition  Arousal/Alertness: Awake/alert Behavior During Therapy: WFL for tasks assessed/performed(Tearful due to news she would need to go to a facility for ~6 weeks.) Overall Cognitive Status: Within Functional Limits for tasks assessed                                        Exercises Total Joint Exercises Goniometric ROM: 10-92 degrees on LLE General Exercises - Lower Extremity Short Arc Quad: AROM;Both;15 reps Long Arc Quad: AROM;Both;15 reps Heel Slides: AROM;AAROM;Both;15 reps Hip ABduction/ADduction: AROM;Both;15 reps Straight Leg Raises: AROM;Both;15 reps Hip Flexion/Marching: AROM;Seated;Both;15 reps    General Comments General comments (skin integrity, edema, etc.): Edema noted around 3rd toe PIP and DIP.      Pertinent Vitals/Pain Pain Assessment: Faces Faces Pain Scale: Hurts even more Pain Location: L knee  Pain Descriptors / Indicators: Aching Pain Intervention(s): Limited activity within patient's tolerance;Monitored during session    Home Living                      Prior Function            PT Goals (current goals can now be found in the care plan section)  Acute Rehab PT Goals Patient Stated Goal: To get stronger so she can go home PT Goal Formulation: With patient Time For Goal Achievement: 10/31/18 Potential to Achieve Goals: Fair Progress towards PT goals: Not progressing toward goals - comment(Unable to stand/ambulate this date due to inability to don socks without dressing on L middle toe)    Frequency    Min 2X/week      PT Plan Current plan remains appropriate    Co-evaluation              AM-PAC PT "6 Clicks" Mobility   Outcome Measure  Help needed turning from your back to your side while in a flat bed without using bedrails?: None Help needed moving from lying on your back to sitting on the side of a flat bed without using bedrails?: None Help needed moving to and from a bed to a chair (including a  wheelchair)?: None Help needed standing up from a chair using your arms (e.g., wheelchair or bedside chair)?: A Lot Help needed to walk in hospital room?: A Lot Help needed climbing 3-5 steps with a railing? : Total 6 Click Score: 17    End of Session Equipment Utilized During Treatment: Gait belt Activity Tolerance: Patient limited by fatigue;Patient limited by pain Patient left: in bed;with call bell/phone within reach;with bed alarm set Nurse Communication: Mobility status PT Visit Diagnosis: Difficulty in walking, not elsewhere classified (R26.2);Muscle weakness (generalized) (M62.81)     Time: 8341-9622 PT Time Calculation (min) (ACUTE ONLY): 16 min  Charges:  $Therapeutic Exercise: 8-22 mins                     Royce Macadamia PT, DPT, CSCS   10/21/2018, 3:03 PM

## 2018-10-21 NOTE — Progress Notes (Signed)
Pt expressed agitation with hanging of first rung of potassium stating" I just started to fall asleep, don't wake me up again." Educated pt on importance of potassium replacement.  Pt stated I could "give the fluid with out waking her up". Proceeded with K+ replacement rungs with out waking pt up.

## 2018-10-21 NOTE — Progress Notes (Signed)
Wet to dry dressing change refused pt expressed agitation stating she "needs sleep"

## 2018-10-21 NOTE — Progress Notes (Signed)
[facility] Podiatry                                                      Patient Demographics  [name], is a 53 y.o. female   MRN: [id number]   DOB - [birth year]  Admit Date - 10/13/2018    Outpatient Primary MD for the patient is Marguerita Merles, MD  With History of -  Past Medical History:  Diagnosis Date  . Anxiety   . Arthritis    PSORIATIC ARTHRITIS  . Back pain   . Colon polyp   . Depression   . Headache    MIGRAINE  . Ulcer       Past Surgical History:  Procedure Laterality Date  . BREAST REDUCTION SURGERY    . COLONOSCOPY WITH ESOPHAGOGASTRODUODENOSCOPY (EGD)    . COLONOSCOPY WITH PROPOFOL N/A 05/19/2016   Procedure: COLONOSCOPY WITH PROPOFOL;  Surgeon: Lollie Sails, MD;  Location: [facility] ENDOSCOPY;  Service: Endoscopy;  Laterality: N/A;  . COLONOSCOPY WITH PROPOFOL N/A 03/06/2018   Procedure: COLONOSCOPY WITH PROPOFOL;  Surgeon: Toledo, Benay Pike, MD;  Location: [facility] ENDOSCOPY;  Service: Gastroenterology;  Laterality: N/A;  . ESOPHAGOGASTRODUODENOSCOPY (EGD) WITH PROPOFOL N/A 03/06/2018   Procedure: ESOPHAGOGASTRODUODENOSCOPY (EGD) WITH PROPOFOL;  Surgeon: Toledo, Benay Pike, MD;  Location: [facility] ENDOSCOPY;  Service: Gastroenterology;  Laterality: N/A;  . foot spurs    . FRACTURE SURGERY    . IRRIGATION AND DEBRIDEMENT KNEE Left 10/16/2018   Procedure: IRRIGATION AND DEBRIDEMENT KNEE ARTHROSCOPY;  Surgeon: Lovell Sheehan, MD;  Location: [facility] ORS;  Service: Orthopedics;  Laterality: Left;  Marland Kitchen MANDIBLE FRACTURE SURGERY    . TEE WITHOUT CARDIOVERSION N/A 10/18/2018   Procedure: TRANSESOPHAGEAL ECHOCARDIOGRAM (TEE);  Surgeon: Corey Skains, MD;  Location: [facility] ORS;  Service: Cardiovascular;  Laterality: N/A;  . TUBAL LIGATION      in for   Chief Complaint  Patient presents with  . Altered Mental Status     HPI  [name]  is a 53  y.o. female, hospitalized with sepsis and multiple sites of infection.  These included the septic arthritis in the knee and abscess on her spine and also an abscess on her third toe left foot I&D the abscess on her toe last week and she is showing steady progression since that time frame.    Review of Systems she is alert and well-oriented today which is a significant improvement over last week.  In addition to the HPI above,  No Fever-chills, No Headache, No changes with Vision or hearing, No problems swallowing food or Liquids, No Chest pain, Cough or Shortness of Breath, No Abdominal pain, No Nausea or Vommitting, Bowel movements are regular, No Blood in stool or Urine, No dysuria, No new skin rashes or bruises, No new joints pains-aches,  No new weakness, tingling, numbness in any extremity, No recent weight gain or loss, No polyuria, polydypsia or polyphagia, No significant Mental Stressors.  A full 10 point Review of Systems was done, except as stated above, all other Review of Systems were negative.   Social History Social History   Tobacco Use  . Smoking status: Former Smoker    Types: Cigarettes  . Smokeless tobacco: Never Used  Substance Use Topics  . Alcohol use: Never    Frequency: Never    Family History  Family History  Problem Relation Age of Onset  . Liver cancer Mother   . Liver disease Mother   . Cirrhosis Mother   . Alcohol abuse Mother   . Cancer Father   . Colon cancer Maternal Aunt     Prior to Admission medications   Medication Sig Start Date End Date Taking? Authorizing Provider  ALPRAZolam Duanne Moron) 1 MG tablet Take 1 mg by mouth 4 (four) times daily.   Yes [provider]  amphetamine-dextroamphetamine (ADDERALL) 30 MG tablet Take 30 mg by mouth 2 (two) times daily.    Yes [provider]  buPROPion (WELLBUTRIN SR) 200 MG 12 hr tablet TK 1 T PO  QAM AND QD AT NOON 08/13/18  Yes [provider]   clotrimazole-betamethasone (LOTRISONE) cream APP TO AFFECTED EAR ONCE OR TWICE WEEKLY PRN FOR SKIN IRRITATION. 11/09/16  Yes [provider]  diazepam (VALIUM) 5 MG tablet Take 1 tablet (5 mg total) by mouth every 8 (eight) hours as needed for anxiety. 10/04/18  Yes Paulette Blanch, MD  escitalopram (LEXAPRO) 20 MG tablet Take 20 mg by mouth daily.    Yes [provider]  FLUoxetine (PROZAC) 20 MG capsule TK 1 C PO QD 08/13/18  Yes [provider]  guanFACINE (TENEX) 2 MG tablet Take 2 mg by mouth daily. 09/08/18  Yes [provider]  ibuprofen (ADVIL) 800 MG tablet Take 1 tablet (800 mg total) by mouth every 8 (eight) hours as needed for moderate pain. 10/04/18  Yes Paulette Blanch, MD  meloxicam (MOBIC) 15 MG tablet Take 15 mg by mouth daily.   Yes [provider]  methocarbamol (ROBAXIN) 500 MG tablet Take 500 mg by mouth every 8 (eight) hours as needed for muscle spasms.   Yes [provider]  oxyCODONE-acetaminophen (PERCOCET/ROXICET) 5-325 MG tablet Take 1 tablet by mouth every 4 (four) hours as needed for severe pain. 10/04/18  Yes Paulette Blanch, MD    Anti-infectives (From admission, onward)   Start     Dose/Rate Route Frequency Ordered Stop   10/17/18 1600  Nafcillin 12g in sodium chloride 0.9% 519ml infusion     21 mL/hr  Intravenous Every 24 hours 10/17/18 1448     10/16/18 1539  bacitracin 50,000 Units in sodium chloride 0.9 % 500 mL irrigation  Status:  Discontinued       As needed 10/16/18 1540 10/16/18 1558   10/16/18 1200  nafcillin 2 g in sodium chloride 0.9 % 100 mL IVPB  Status:  Discontinued     2 g 200 mL/hr over 30 Minutes Intravenous Every 4 hours 10/16/18 1101 10/17/18 1449   10/16/18 1100  nafcillin injection 2 g  Status:  Discontinued     2 g Intravenous Every 4 hours 10/16/18 1057 10/16/18 1101   10/14/18 1900  ceFAZolin (ANCEF) IVPB 2g/100 mL premix  Status:  Discontinued     2 g 200 mL/hr over 30 Minutes Intravenous Every  8 hours 10/14/18 1601 10/16/18 1057   10/14/18 1800  vancomycin (VANCOCIN) IVPB 1000 mg/200 mL premix  Status:  Discontinued     1,000 mg 200 mL/hr over 60 Minutes Intravenous Every 24 hours 10/13/18 1645 10/14/18 0804   10/14/18 0900  ceFAZolin (ANCEF) IVPB 2g/100 mL premix  Status:  Discontinued     2 g 200 mL/hr over 30 Minutes Intravenous Every 8 hours 10/14/18 0804 10/14/18 1601   10/13/18 2200  ceFEPIme (MAXIPIME) 2 g in sodium chloride 0.9 % 100  mL IVPB  Status:  Discontinued     2 g 200 mL/hr over 30 Minutes Intravenous Every 8 hours 10/13/18 1645 10/14/18 0804   10/13/18 1530  vancomycin (VANCOCIN) 1,500 mg in sodium chloride 0.9 % 500 mL IVPB     1,500 mg 250 mL/hr over 120 Minutes Intravenous  Once 10/13/18 1521 10/13/18 1949   10/13/18 1515  ceFEPIme (MAXIPIME) 2 g in sodium chloride 0.9 % 100 mL IVPB     2 g 200 mL/hr over 30 Minutes Intravenous  Once 10/13/18 1513 10/13/18 1617   10/13/18 1515  metroNIDAZOLE (FLAGYL) IVPB 500 mg     500 mg 100 mL/hr over 60 Minutes Intravenous  Once 10/13/18 1513 10/13/18 1724   10/13/18 1515  vancomycin (VANCOCIN) IVPB 1000 mg/200 mL premix  Status:  Discontinued     1,000 mg 200 mL/hr over 60 Minutes Intravenous  Once 10/13/18 1513 10/13/18 1521      Scheduled Meds: . buPROPion  300 mg Oral Daily  . diazepam  1 mg Oral BID  . docusate sodium  100 mg Oral BID  . escitalopram  30 mg Oral Daily  . fentaNYL  1 patch Transdermal Q72H  . fluticasone  1 spray Each Nare Daily  . pantoprazole  40 mg Oral BID  . potassium chloride  20 mEq Oral TID  . sodium chloride flush  10-40 mL Intracatheter Q12H   Continuous Infusions: . sodium chloride Stopped (10/21/18 0301)  . [START ON 10/22/2018] sodium chloride    . Nafcillin 12g in sodium chloride 0.9% 526ml infusion 21 mL/hr at 10/21/18 1635   PRN Meds:.acetaminophen **OR** acetaminophen, ALPRAZolam, loperamide, metoCLOPramide **OR** metoCLOPramide (REGLAN) injection, morphine injection,  ondansetron **OR** ondansetron (ZOFRAN) IV, oxyCODONE, sodium chloride flush, ziprasidone  Allergies  Allergen Reactions  . Topiramate Hives    Physical Exam: Patient is alert and well-oriented today she is to be much more engaged and capable of conversation and related information then she has been this past week.  Vitals  Blood pressure (!) 119/96, pulse 95, temperature 98.8 F (37.1 C), temperature source Oral, resp. rate 18, height 5\' 4"  (1.626 m), weight 80.7 kg, last menstrual period 01/22/2017, SpO2 96 %.  Lower Extremity exam: Examination of the toe today shows granular tissue across the ulcer site at this point appears to be healing and progressing very well.  No evidence of cellulitis or redness to the digit at this point.  Wound itself is approximately 8 mm diameter with a couple millimeters of depth. White count is significantly improved.  She still has an abscess in her spine that they are going to drain tomorrow.  She had a knee irrigation and I also did the I&D of the toe at bedside last week. CBC Recent Labs  Lab 10/15/18 0906 10/16/18 0010 10/19/18 0400 10/21/18 0147  WBC 22.1* 24.3* 16.5* 14.6*  HGB 9.8* 8.5* 8.1* 8.6*  HCT 29.4* 25.5* 24.5* 26.5*  PLT 504* 501* 645* 706*  MCV 93.3 93.1 93.2 93.3  MCH 31.1 31.0 30.8 30.3  MCHC 33.3 33.3 33.1 32.5  RDW 14.8 14.8 15.5 15.2   ------------------------------------------------------------------------------------------------------------------  Chemistries  Recent Labs  Lab 10/15/18 0906 10/16/18 0010 10/19/18 0400 10/20/18 0553 10/21/18 0147 10/21/18 1135  NA 131* 130* 134*  --  134*  --   K 3.9 3.7 3.1* 2.7* 2.4* 3.4*  CL 98 97* 100  --  97*  --   CO2 25 25 26   --  27  --   GLUCOSE 108*  106* 96  --  97  --   BUN 15 11 5*  --  <5*  --   CREATININE 0.63 0.57 0.56  --  0.48  --   CALCIUM 7.5* 7.4* 7.5*  --  7.6*  --   MG  --   --   --  1.7 2.4  --   AST 23 19 20   --   --   --   ALT 25 18 15   --   --   --    ALKPHOS 458* 436* 312*  --   --   --   BILITOT 1.6* 1.5* 1.4*  --   --   --    ----------------------------------------------------------------------------------------------Assessment & Plan: Patient's toe looks very good should continue to heal over the next couple of weeks.  Would recommend continued wet-to-dry saline dressing to the area at this timeframe changed daily.  Got a sign off on her at this point.  If wound is still open later on she can schedule appointment to my office.  Active Problems:   Sepsis ([place])   Substance abuse [facility])   Family Communication: Plan discussed with patient   Albertine Patricia M.D on 10/21/2018 at 5:52 PM  Thank you for the consult, we will follow the patient with you in the Hospital.

## 2018-10-21 NOTE — Progress Notes (Signed)
[facility] [place] NAME: [name]    MR#:  [id number]  DATE OF BIRTH:  01-29-1966  SUBJECTIVE:   Patient still complaining of some left knee pain and back pain but improved.  Discussed with IR and plan for drainage of the paraspinous abscess tomorrow.  REVIEW OF SYSTEMS:    Review of Systems  Constitutional: Negative for chills and fever.  HENT: Negative for congestion and tinnitus.   Eyes: Negative for blurred vision and double vision.  Respiratory: Negative for cough, shortness of breath and wheezing.   Cardiovascular: Negative for chest pain, orthopnea and PND.  Gastrointestinal: Negative for abdominal pain, diarrhea, nausea and vomiting.  Genitourinary: Negative for dysuria and hematuria.  Musculoskeletal: Positive for back pain.  Neurological: Negative for dizziness, sensory change and focal weakness.  All other systems reviewed and are negative.   Nutrition: Regular Tolerating Diet: Yes Tolerating PT: Ambulatory   DRUG ALLERGIES:   Allergies  Allergen Reactions  . Topiramate Hives    VITALS:  Blood pressure (!) 156/92, pulse 83, temperature 98.1 F (36.7 C), resp. rate 16, height 5\' 4"  (1.626 m), weight 80.7 kg, last menstrual period 01/22/2017, SpO2 96 %.  PHYSICAL EXAMINATION:   Physical Exam  GENERAL:  53 y.o.-year-old patient lying in bed in no acute distress.  EYES: Pupils equal, round, reactive to light and accommodation. No scleral icterus. Extraocular muscles intact.  HEENT: Head atraumatic, normocephalic. Oropharynx and nasopharynx clear.  NECK:  Supple, no jugular venous distention. No thyroid enlargement, no tenderness.  LUNGS: Normal breath sounds bilaterally, no wheezing, rales, rhonchi. No use of accessory muscles of respiration.  CARDIOVASCULAR: S1, S2 normal. No murmurs, rubs, or gallops.  ABDOMEN: Soft, nontender, nondistended. Bowel sounds present. No organomegaly or mass.  EXTREMITIES: No  cyanosis, clubbing or edema b/l.    NEUROLOGIC: Cranial nerves II through XII are intact. No focal Motor or sensory deficits b/l. Globally weak.   PSYCHIATRIC: The patient is alert and oriented x 3.  SKIN: No obvious rash, lesion, or ulcer.    LABORATORY PANEL:   CBC Recent Labs  Lab 10/21/18 0147  WBC 14.6*  HGB 8.6*  HCT 26.5*  PLT 706*   ------------------------------------------------------------------------------------------------------------------  Chemistries  Recent Labs  Lab 10/19/18 0400  10/21/18 0147 10/21/18 1135  NA 134*  --  134*  --   K 3.1*   < > 2.4* 3.4*  CL 100  --  97*  --   CO2 26  --  27  --   GLUCOSE 96  --  97  --   BUN 5*  --  <5*  --   CREATININE 0.56  --  0.48  --   CALCIUM 7.5*  --  7.6*  --   MG  --    < > 2.4  --   AST 20  --   --   --   ALT 15  --   --   --   ALKPHOS 312*  --   --   --   BILITOT 1.4*  --   --   --    < > = values in this interval not displayed.   ------------------------------------------------------------------------------------------------------------------  Cardiac Enzymes No results for input(s): TROPONINI in the last 168 hours. ------------------------------------------------------------------------------------------------------------------  RADIOLOGY:  No results found.   ASSESSMENT AND PLAN:   53 year old female with past medical history of anxiety/depression, chronic back pain, osteoarthritis who presented to the hospital due to suspected  sepsis.  1.  Sepsis- patient presented to the hospital with fever, tachycardia and leukocytosis. -Source of sepsis is disseminated staph infection from underlying paraspinous fluid collection from paraspinous abscess/discitis and also left knee septic arthritis.  -TEE was negative for endocarditis.  Continue IV nafcillin. - Discussed w/ ID and pt. Will likely need 4-6 weeks of IV abx.  Likely will need PICC line.    2.  Discitis/paraspinous abscess- patient underwent  a MRI of the thoracic spine which showed heterogeneous paraspinous abscess at the level of T9-T10. -Continue IV nafcillin.  -IR guided drainage of the abscess was unsuccessful on Friday, discussed with IR and they will reattempt tomorrow.  3.  MSSA bacteremia-secondary to #2. -Continue IV nafcillin.  As mentioned TEE was negative for endocarditis.  Repeat cultures on 6/24 are currently negative.  ID is following. - will need IV abx for 4-6 weeks and likely will need PICC Line.   4. Left knee septic Arthritis/left toe infection - s/p washout and I & D and currently stable.  - cont. abx as mentioned above with nafcillin.   5.  Anxiety/depression- continue Wellbutrin, Xanax.  6.  GERD- continue Protonix.  7.  Hypokalemia- improving with supplementation up to 3.4 today.  - Mg. Level was normal.   Discussed plan of care with ID, IR.   All the records are reviewed and case discussed with Care Management/Social Worker. Management plans discussed with the patient, family and they are in agreement.  CODE STATUS: Full code  DVT Prophylaxis: Ted's & SCD's.   TOTAL TIME TAKING CARE OF THIS PATIENT: 30 minutes.   POSSIBLE D/C unclear DAYS, DEPENDING ON CLINICAL CONDITION and progress.   Henreitta Leber M.D on 10/21/2018 at 12:59 PM  Between 7am to 6pm - Pager - [phone]  After 6pm go to [email or web address] - password [email or web address] [place] [facility]  Office  [phone]  CC: Primary care physician; Marguerita Merles, MD

## 2018-10-21 NOTE — Plan of Care (Signed)
  Problem: Education: Goal: Knowledge of General Education information will improve Description Including pain rating scale, medication(s)/side effects and non-pharmacologic comfort measures Outcome: Progressing   

## 2018-10-21 NOTE — Consult Note (Addendum)
PHARMACY CONSULT NOTE - FOLLOW UP  Pharmacy Consult for Electrolyte Monitoring and Replacement   Recent Labs: Potassium (mmol/L)  Date Value  10/21/2018 2.4 (LL)  06/02/2011 3.8   Magnesium (mg/dL)  Date Value  10/21/2018 2.4   Calcium (mg/dL)  Date Value  10/21/2018 7.6 (L)   Calcium, Total (mg/dL)  Date Value  06/02/2011 9.1   Albumin (g/dL)  Date Value  10/19/2018 1.8 (L)  11/27/2016 3.5  06/02/2011 3.8   Phosphorus (mg/dL)  Date Value  10/21/2018 3.6   Sodium (mmol/L)  Date Value  10/21/2018 134 (L)  11/27/2016 141  06/02/2011 140   Corrected Ca: 9.36  Assessment: Pharmacy consulted for electrolyte monitoring and replacement in 53 yo female admitted with sepsis secondary to MSSA bacteremia.   Goal of Therapy:  Electrolytes WNL   Plan:  6/29 @ 0147: K: 2.4. Will replace with KCL 71mEq IV x 6 and KCL 24mEq PO x 1 dose, followed by KCL 26mEq PO x 1 dose 4 hours later   Will recheck K+ level @ 1000 today.   Pharmacy will continue to monitor and replace electrolytes as needed.   Pernell Dupre, PharmD, BCPS Clinical Pharmacist 10/21/2018 2:31 AM

## 2018-10-21 NOTE — Progress Notes (Signed)
Pt hair sectioned and combed x3 times. Shampooed. Section and comb through repeated. Visible louse large, small and barley visible removed. Some mobile some non-mobile on comb and in sink where hair was treated.

## 2018-10-22 ENCOUNTER — Inpatient Hospital Stay: Payer: Medicaid Other

## 2018-10-22 LAB — CBC
HCT: 26.2 % — ABNORMAL LOW (ref 36.0–46.0)
Hemoglobin: 8.5 g/dL — ABNORMAL LOW (ref 12.0–15.0)
MCH: 31 pg (ref 26.0–34.0)
MCHC: 32.4 g/dL (ref 30.0–36.0)
MCV: 95.6 fL (ref 80.0–100.0)
Platelets: 743 10*3/uL — ABNORMAL HIGH (ref 150–400)
RBC: 2.74 MIL/uL — ABNORMAL LOW (ref 3.87–5.11)
RDW: 15.9 % — ABNORMAL HIGH (ref 11.5–15.5)
WBC: 13.1 10*3/uL — ABNORMAL HIGH (ref 4.0–10.5)
nRBC: 0 % (ref 0.0–0.2)

## 2018-10-22 LAB — POTASSIUM: Potassium: 3.3 mmol/L — ABNORMAL LOW (ref 3.5–5.1)

## 2018-10-22 MED ORDER — FENTANYL CITRATE (PF) 100 MCG/2ML IJ SOLN
INTRAMUSCULAR | Status: AC
  Filled 2018-10-22: qty 4

## 2018-10-22 MED ORDER — MIDAZOLAM HCL 5 MG/5ML IJ SOLN
INTRAMUSCULAR | Status: AC
  Filled 2018-10-22: qty 10

## 2018-10-22 MED ORDER — PERMETHRIN 5 % EX CREA
TOPICAL_CREAM | Freq: Once | CUTANEOUS | Status: AC
  Administered 2018-10-22: 15:00:00 via TOPICAL
  Filled 2018-10-22: qty 60

## 2018-10-22 MED ORDER — KETOROLAC TROMETHAMINE 15 MG/ML IJ SOLN
15.0000 mg | Freq: Once | INTRAMUSCULAR | Status: AC
  Administered 2018-10-22: 15 mg via INTRAVENOUS
  Filled 2018-10-22: qty 1

## 2018-10-22 NOTE — Progress Notes (Signed)
Notified Dr Verdell Carmine that patient was complaining of 9 out of 10 chest pain. MD acknowledged and ordered Toradol 15 mg IV once.

## 2018-10-22 NOTE — TOC Progression Note (Signed)
Transition of Care [facility]) - Progression Note    Patient Details  Name: [name] MRN: [id number] Date of Birth: January 12, 1966  [facility]) CM/SW Contact  Shade Flood, LCSW Phone Number: 10/22/2018, 9:38 AM  Clinical Narrative:     TOC following with chart review. Discussed pt with TOC supervisor. At this time, plan is for pt to remain in the hospital to receive her antibiotic therapy due to pt's history of IV drug use and psychosocial behavioral concerns. TOC will be available as needed.  Expected Discharge Plan: Home/Self Care Barriers to Discharge: Continued Medical Work up  Expected Discharge Plan and Services Expected Discharge Plan: Home/Self Care   Discharge Planning Services: CM Consult   Living arrangements for the past 2 months: Single Family Home                                       Social Determinants of Health (SDOH) Interventions    Readmission Risk Interventions No flowsheet data found.

## 2018-10-22 NOTE — Progress Notes (Signed)
Patient clinically stable post abscess drain fluid per Dr Register. Patient dozed throughout entire procedure with only local anesthetic per Dr Register. Vitals were stable throughout procedure. After procedure done and patient rolling to her bed to go back to room, stated "I hurt so bad, can I have a coke and food now.", patient visually appeared to tolerated procedure well with no complications during process. 106ml bloody fluid removed per procedure. Report given to [place] with questions answered.

## 2018-10-22 NOTE — Progress Notes (Signed)
PT Cancellation Note  Patient Details Name: [name] MRN: [id number] DOB: [birth year]   Cancelled Treatment:    Reason Eval/Treat Not Completed: Patient at procedure or test/unavailable;Medical issues which prohibited therapy(Pt is now s/p abscess drainage. Please place new PT order once patient is medially appropriate to reEvaluate.)  3:43 PM, 10/22/18 [name], PT, DPT Physical Therapist - [facility]  [phone] ([place])    [name] 10/22/2018, 3:43 PM

## 2018-10-22 NOTE — Consult Note (Addendum)
PHARMACY CONSULT NOTE - FOLLOW UP  Pharmacy Consult for Electrolyte Monitoring and Replacement   Recent Labs: Potassium (mmol/L)  Date Value  10/22/2018 3.3 (L)  06/02/2011 3.8   Magnesium (mg/dL)  Date Value  10/21/2018 2.4   Calcium (mg/dL)  Date Value  10/21/2018 7.6 (L)   Calcium, Total (mg/dL)  Date Value  06/02/2011 9.1   Albumin (g/dL)  Date Value  10/19/2018 1.8 (L)  11/27/2016 3.5  06/02/2011 3.8   Phosphorus (mg/dL)  Date Value  10/21/2018 3.6   Sodium (mmol/L)  Date Value  10/21/2018 134 (L)  11/27/2016 141  06/02/2011 140    Assessment: Pharmacy consulted for electrolyte monitoring and replacement in 53 yo female admitted with sepsis secondary to MSSA bacteremia.   Goal of Therapy:  Electrolytes WNL   Plan:  6/30 K 3.3. Patient to receive potassium 20 mEq PO x 3 doses today.  Will check potassium with morning labs.  Pharmacy will continue to monitor and replace electrolytes as needed.   Tawnya Crook, PharmD Clinical Pharmacist 10/22/2018 7:42 AM

## 2018-10-22 NOTE — Progress Notes (Signed)
[facility] [place] NAME: [name]    MR#:  [id number]  DATE OF BIRTH:  1966-01-19  SUBJECTIVE:   Patient is status post IR drainage of the right paraspinous abscess.  Still complaining of back pain and left knee pain.  REVIEW OF SYSTEMS:    Review of Systems  Constitutional: Negative for chills and fever.  HENT: Negative for congestion and tinnitus.   Eyes: Negative for blurred vision and double vision.  Respiratory: Negative for cough, shortness of breath and wheezing.   Cardiovascular: Negative for chest pain, orthopnea and PND.  Gastrointestinal: Negative for abdominal pain, diarrhea, nausea and vomiting.  Genitourinary: Negative for dysuria and hematuria.  Musculoskeletal: Positive for back pain.  Neurological: Negative for dizziness, sensory change and focal weakness.  All other systems reviewed and are negative.   Nutrition: Regular Tolerating Diet: Yes Tolerating PT: Ambulatory   DRUG ALLERGIES:   Allergies  Allergen Reactions  . Topiramate Hives    VITALS:  Blood pressure (!) 165/88, pulse 71, temperature 99.1 F (37.3 C), temperature source Oral, resp. rate 14, height 5\' 4"  (1.626 m), weight 80.7 kg, last menstrual period 01/22/2017, SpO2 96 %.  PHYSICAL EXAMINATION:   Physical Exam  GENERAL:  53 y.o.-year-old patient lying in bed in no acute distress.  EYES: Pupils equal, round, reactive to light and accommodation. No scleral icterus. Extraocular muscles intact.  HEENT: Head atraumatic, normocephalic. Oropharynx and nasopharynx clear.  NECK:  Supple, no jugular venous distention. No thyroid enlargement, no tenderness.  LUNGS: Normal breath sounds bilaterally, no wheezing, rales, rhonchi. No use of accessory muscles of respiration.  CARDIOVASCULAR: S1, S2 normal. No murmurs, rubs, or gallops.  ABDOMEN: Soft, nontender, nondistended. Bowel sounds present. No organomegaly or mass.  EXTREMITIES: No cyanosis,  clubbing or edema b/l.    NEUROLOGIC: Cranial nerves II through XII are intact. No focal Motor or sensory deficits b/l. Globally weak.   PSYCHIATRIC: The patient is alert and oriented x 3.  SKIN: No obvious rash, lesion, or ulcer.    LABORATORY PANEL:   CBC Recent Labs  Lab 10/22/18 0407  WBC 13.1*  HGB 8.5*  HCT 26.2*  PLT 743*   ------------------------------------------------------------------------------------------------------------------  Chemistries  Recent Labs  Lab 10/19/18 0400  10/21/18 0147  10/22/18 0407  NA 134*  --  134*  --   --   K 3.1*   < > 2.4*   < > 3.3*  CL 100  --  97*  --   --   CO2 26  --  27  --   --   GLUCOSE 96  --  97  --   --   BUN 5*  --  <5*  --   --   CREATININE 0.56  --  0.48  --   --   CALCIUM 7.5*  --  7.6*  --   --   MG  --    < > 2.4  --   --   AST 20  --   --   --   --   ALT 15  --   --   --   --   ALKPHOS 312*  --   --   --   --   BILITOT 1.4*  --   --   --   --    < > = values in this interval not displayed.   ------------------------------------------------------------------------------------------------------------------  Cardiac Enzymes No results for input(s): TROPONINI  in the last 168 hours. ------------------------------------------------------------------------------------------------------------------  RADIOLOGY:  Ct Image Guided Drainage By Percutaneous Catheter  Result Date: 10/22/2018 INDICATION: Paraspinal abscess EXAM: CT GUIDED DRAINAGE OF PARASPINAL ABSCESS MEDICATIONS: The patient is currently admitted to the hospital and receiving intravenous antibiotics. The antibiotics were administered within an appropriate time frame prior to the initiation of the procedure. ANESTHESIA/SEDATION: None. COMPLICATIONS: None immediate. TECHNIQUE: Informed written consent was obtained from the patient after a thorough discussion of the procedural risks, benefits and alternatives. All questions were addressed. Maximal Sterile  Barrier Technique was utilized including caps, mask, sterile gowns, sterile gloves, sterile drape, hand hygiene and skin antiseptic. A timeout was performed prior to the initiation of the procedure. Local anesthesia was administered with 1% lidocaine. 18 gauge needle was advanced into the right paraspinal abscess and 7 cc of bloody fluid removed. There no complications. Patient remained stable postprocedure. Follow up with her MD. PROCEDURE: As above. FINDINGS: 7 cc bloody fluid removed from right paraspinal fluid collection. Fluid sent to microbiology for further evaluation. IMPRESSION: Successful right paraspinal abscess/fluid collection aspiration. Fluid sent to microbiology for aerobic and anaerobic cultures and sensitivities. Electronically Signed   By: Marcello Moores  Register   On: 10/22/2018 12:55     ASSESSMENT AND PLAN:   53 year old female with past medical history of anxiety/depression, chronic back pain, osteoarthritis who presented to the hospital due to suspected sepsis.  1.  Sepsis- patient presented to the hospital with fever, tachycardia and leukocytosis. -Source of sepsis is disseminated staph infection from underlying paraspinous fluid collection from paraspinous abscess/discitis and also left knee septic arthritis.  -TEE was negative for endocarditis.  Continue IV nafcillin. - Discussed w/ ID and pt. Will likely need 4-6 weeks of IV abx.    2.  Discitis/paraspinous abscess- patient underwent a MRI of the thoracic spine which showed heterogeneous paraspinous abscess at the level of T9-T10. -Continue IV nafcillin.  - Status post IR guided drainage of the paraspinous abscess today.   -Await wound cultures.  3.  MSSA bacteremia-secondary to #2. -Continue IV nafcillin.  As mentioned TEE was negative for endocarditis.  Repeat cultures on 6/24 are currently negative.  ID is following. - will need IV abx for 4-6 weeks but given pt's hx of cocaine abuse ID does not want her to go home with  PICC line and pt. Does not want to go to SNF.  Will need to discuss with ID regarding dispo plans.   4. Left knee septic Arthritis/left toe infection - s/p washout and I & D and currently stable.  - cont. abx as mentioned above with nafcillin.   5.  Anxiety/depression- continue Wellbutrin, Xanax.  6.  GERD- continue Protonix.  7.  Hypokalemia- improved with supplementation.   - Mg. Level was normal.   All the records are reviewed and case discussed with Care Management/Social Worker. Management plans discussed with the patient, family and they are in agreement.  CODE STATUS: Full code  DVT Prophylaxis: Ted's & SCD's.   TOTAL TIME TAKING CARE OF THIS PATIENT: 30 minutes.   POSSIBLE D/C unclear DAYS, DEPENDING ON CLINICAL CONDITION and progress.   Henreitta Leber M.D on 10/22/2018 at 1:45 PM  Between 7am to 6pm - Pager - [phone]  After 6pm go to [email or web address] - password [email or web address] [facility]  Office  [phone]  CC: Primary care physician; Marguerita Merles, MD

## 2018-10-22 NOTE — Progress Notes (Signed)
Pt. Stable after paraspinal abscess aspiration.VSS Back stable.F/U with her MD.

## 2018-10-23 ENCOUNTER — Inpatient Hospital Stay: Payer: Medicaid Other

## 2018-10-23 DIAGNOSIS — G601 Refsum's disease: Secondary | ICD-10-CM

## 2018-10-23 DIAGNOSIS — R079 Chest pain, unspecified: Secondary | ICD-10-CM

## 2018-10-23 LAB — PROCALCITONIN: Procalcitonin: <0.1 ng/mL

## 2018-10-23 LAB — BASIC METABOLIC PANEL
Anion gap: 11 (ref 5–15)
BUN: 6 mg/dL (ref 6–20)
CO2: 24 mmol/L (ref 22–32)
Calcium: 7.9 mg/dL — ABNORMAL LOW (ref 8.9–10.3)
Chloride: 100 mmol/L (ref 98–111)
Creatinine, Ser: 0.54 mg/dL (ref 0.44–1.00)
GFR calc Af Amer: >60 mL/min (ref >60)
GFR calc non Af Amer: >60 mL/min (ref >60)
Glucose, Bld: 96 mg/dL (ref 70–99)
Potassium: 3.2 mmol/L — ABNORMAL LOW (ref 3.5–5.1)
Sodium: 135 mmol/L (ref 135–145)

## 2018-10-23 LAB — CBC
HCT: 27.7 % — ABNORMAL LOW (ref 36.0–46.0)
Hemoglobin: 8.7 g/dL — ABNORMAL LOW (ref 12.0–15.0)
MCH: 30.6 pg (ref 26.0–34.0)
MCHC: 31.4 g/dL (ref 30.0–36.0)
MCV: 97.5 fL (ref 80.0–100.0)
Platelets: 817 10*3/uL — ABNORMAL HIGH (ref 150–400)
RBC: 2.84 MIL/uL — ABNORMAL LOW (ref 3.87–5.11)
RDW: 15.8 % — ABNORMAL HIGH (ref 11.5–15.5)
WBC: 13.9 10*3/uL — ABNORMAL HIGH (ref 4.0–10.5)
nRBC: 0 % (ref 0.0–0.2)

## 2018-10-23 LAB — TROPONIN I (HIGH SENSITIVITY)
Troponin I (High Sensitivity): 4 ng/L (ref <18)
Troponin I (High Sensitivity): 4 ng/L (ref <18)

## 2018-10-23 MED ORDER — POTASSIUM CHLORIDE CRYS ER 20 MEQ PO TBCR
40.0000 meq | EXTENDED_RELEASE_TABLET | ORAL | Status: AC
  Administered 2018-10-23 (×2): 40 meq via ORAL
  Filled 2018-10-23 (×2): qty 2

## 2018-10-23 MED ORDER — NITROGLYCERIN 0.4 MG SL SUBL
SUBLINGUAL_TABLET | SUBLINGUAL | Status: AC
  Administered 2018-10-23: 01:00:00 via SUBLINGUAL
  Filled 2018-10-23: qty 1

## 2018-10-23 MED ORDER — POTASSIUM CHLORIDE CRYS ER 20 MEQ PO TBCR
20.0000 meq | EXTENDED_RELEASE_TABLET | Freq: Once | ORAL | Status: AC
  Administered 2018-10-23: 20 meq via ORAL
  Filled 2018-10-23: qty 1

## 2018-10-23 MED ORDER — FLUCONAZOLE 100 MG PO TABS
200.0000 mg | ORAL_TABLET | Freq: Once | ORAL | Status: AC
  Administered 2018-10-23: 22:00:00 200 mg via ORAL
  Filled 2018-10-23: qty 2

## 2018-10-23 MED ORDER — VANCOMYCIN HCL 10 G IV SOLR
1500.0000 mg | Freq: Once | INTRAVENOUS | Status: AC
  Administered 2018-10-23: 02:00:00 1500 mg via INTRAVENOUS
  Filled 2018-10-23: qty 1500

## 2018-10-23 MED ORDER — NITROGLYCERIN 0.4 MG SL SUBL
0.4000 mg | SUBLINGUAL_TABLET | SUBLINGUAL | Status: AC | PRN
  Administered 2018-10-23 (×2): 0.4 mg via SUBLINGUAL
  Administered 2018-10-23: 01:00:00 via SUBLINGUAL
  Filled 2018-10-23: qty 1

## 2018-10-23 MED ORDER — SODIUM CHLORIDE 0.9 % IV SOLN
2.0000 g | Freq: Three times a day (TID) | INTRAVENOUS | Status: DC
  Administered 2018-10-23 (×2): 2 g via INTRAVENOUS
  Filled 2018-10-23 (×3): qty 2

## 2018-10-23 MED ORDER — VANCOMYCIN HCL 10 G IV SOLR: 1500.0000 mg | INTRAVENOUS | Status: DC

## 2018-10-23 MED ORDER — VANCOMYCIN HCL 10 G IV SOLR
1500.0000 mg | INTRAVENOUS | Status: DC
  Filled 2018-10-23: qty 1500

## 2018-10-23 NOTE — TOC Progression Note (Signed)
Transition of Care [facility]) - Progression Note    Patient Details  Name: [name] MRN: [id number] Date of Birth: 1965-04-26  Transition of [facility]) CM/SW Contact  Cathlene Gardella, Lenice Llamas Phone Number: [phone]  10/23/2018, 2:33 PM  Clinical Narrative:  Clinical Social Worker (CSW) contacted patient at her request. Patient asked if she can get some help at home. CSW made patient aware that she will have to stay in the hospital while she is getting IV ABX. Per patient she will be getting IV ABX for 1 more month. CSW explained that depending on patient's progress home health may be able to be set up. Patient verbalized her understanding and reported no other needs or concerns.       Expected Discharge Plan: Home/Self Care Barriers to Discharge: Continued Medical Work up  Expected Discharge Plan and Services Expected Discharge Plan: Home/Self Care   Discharge Planning Services: CM Consult   Living arrangements for the past 2 months: Single Family Home                                       Social Determinants of Health (SDOH) Interventions    Readmission Risk Interventions No flowsheet data found.

## 2018-10-23 NOTE — Progress Notes (Signed)
[facility] [place] NAME: [name]    MR#:  [id number]  DATE OF BIRTH:  01-29-1966  SUBJECTIVE:   Patient is status post IR drainage of the right paraspinous abscess. She complains of chest pain this morning.  Troponin and EKG unremarkable. She said that she has better back pain and left knee pain.  REVIEW OF SYSTEMS:    Review of Systems  Constitutional: Negative for chills and fever.  HENT: Negative for congestion and tinnitus.   Eyes: Negative for blurred vision and double vision.  Respiratory: Negative for cough, shortness of breath and wheezing.   Cardiovascular: Positive for chest pain. Negative for orthopnea and PND.  Gastrointestinal: Negative for abdominal pain, diarrhea, nausea and vomiting.  Genitourinary: Negative for dysuria and hematuria.  Musculoskeletal: Positive for back pain.  Skin: Negative for rash.  Neurological: Negative for dizziness, sensory change and focal weakness.  Psychiatric/Behavioral: Negative for depression. The patient is not nervous/anxious.   All other systems reviewed and are negative.   Nutrition: Regular Tolerating Diet: Yes Tolerating PT: Ambulatory   DRUG ALLERGIES:   Allergies  Allergen Reactions   Topiramate Hives    VITALS:  Blood pressure 116/89, pulse 86, temperature 98.8 F (37.1 C), temperature source Oral, resp. rate 18, height 5\' 4"  (1.626 m), weight 80.7 kg, last menstrual period 01/22/2017, SpO2 91 %.  PHYSICAL EXAMINATION:   Physical Exam  GENERAL:  53 y.o.-year-old patient lying in bed in no acute distress.  EYES: Pupils equal, round, reactive to light and accommodation. No scleral icterus. Extraocular muscles intact.  HEENT: Head atraumatic, normocephalic. Oropharynx and nasopharynx clear.  NECK:  Supple, no jugular venous distention. No thyroid enlargement, no tenderness.  LUNGS: Normal breath sounds bilaterally, no wheezing, rales, rhonchi. No use of accessory  muscles of respiration.  CARDIOVASCULAR: S1, S2 normal. No murmurs, rubs, or gallops.  ABDOMEN: Soft, nontender, nondistended. Bowel sounds present. No organomegaly or mass.  EXTREMITIES: No cyanosis, clubbing or edema b/l.    NEUROLOGIC: Cranial nerves II through XII are intact. No focal Motor or sensory deficits b/l. Globally weak.   PSYCHIATRIC: The patient is alert and oriented x 3.  SKIN: No obvious rash, lesion, or ulcer.    LABORATORY PANEL:   CBC Recent Labs  Lab 10/23/18 0210  WBC 13.9*  HGB 8.7*  HCT 27.7*  PLT 817*   ------------------------------------------------------------------------------------------------------------------  Chemistries  Recent Labs  Lab 10/19/18 0400  10/21/18 0147  10/23/18 0210  NA 134*  --  134*  --  135  K 3.1*   < > 2.4*   < > 3.2*  CL 100  --  97*  --  100  CO2 26  --  27  --  24  GLUCOSE 96  --  97  --  96  BUN 5*  --  <5*  --  6  CREATININE 0.56  --  0.48  --  0.54  CALCIUM 7.5*  --  7.6*  --  7.9*  MG  --    < > 2.4  --   --   AST 20  --   --   --   --   ALT 15  --   --   --   --   ALKPHOS 312*  --   --   --   --   BILITOT 1.4*  --   --   --   --    < > =  values in this interval not displayed.   ------------------------------------------------------------------------------------------------------------------  Cardiac Enzymes No results for input(s): TROPONINI in the last 168 hours. ------------------------------------------------------------------------------------------------------------------  RADIOLOGY:  Dg Chest Port 1 View  Result Date: 10/23/2018 CLINICAL DATA:  Cough EXAM: PORTABLE CHEST 1 VIEW COMPARISON:  Chest CT 10/17/2018 chest x-ray 10/15/2018 FINDINGS: Worsening airspace disease within the right mid and lower lung. Persistent left basilar opacity, slightly improved. Suspect layering right effusion. Heart is borderline in size. IMPRESSION: Improving left basilar airspace opacity/infiltrate. New/worsening right  mid and lower lung consolidation. Suspect layering right effusion. Electronically Signed   By: Rolm Baptise M.D.   On: 10/23/2018 00:55   Ct Image Guided Drainage By Percutaneous Catheter  Result Date: 10/22/2018 INDICATION: Paraspinal abscess EXAM: CT GUIDED DRAINAGE OF PARASPINAL ABSCESS MEDICATIONS: The patient is currently admitted to the hospital and receiving intravenous antibiotics. The antibiotics were administered within an appropriate time frame prior to the initiation of the procedure. ANESTHESIA/SEDATION: None. COMPLICATIONS: None immediate. TECHNIQUE: Informed written consent was obtained from the patient after a thorough discussion of the procedural risks, benefits and alternatives. All questions were addressed. Maximal Sterile Barrier Technique was utilized including caps, mask, sterile gowns, sterile gloves, sterile drape, hand hygiene and skin antiseptic. A timeout was performed prior to the initiation of the procedure. Local anesthesia was administered with 1% lidocaine. 18 gauge needle was advanced into the right paraspinal abscess and 7 cc of bloody fluid removed. There no complications. Patient remained stable postprocedure. Follow up with her MD. PROCEDURE: As above. FINDINGS: 7 cc bloody fluid removed from right paraspinal fluid collection. Fluid sent to microbiology for further evaluation. IMPRESSION: Successful right paraspinal abscess/fluid collection aspiration. Fluid sent to microbiology for aerobic and anaerobic cultures and sensitivities. Electronically Signed   By: Marcello Moores  Register   On: 10/22/2018 12:55     ASSESSMENT AND PLAN:   53 year old female with past medical history of anxiety/depression, chronic back pain, osteoarthritis who presented to the hospital due to suspected sepsis.  1.  Sepsis- patient presented to the hospital with fever, tachycardia and leukocytosis. -Source of sepsis is disseminated staph infection from underlying paraspinous fluid collection from  paraspinous abscess/discitis and also left knee septic arthritis.  -TEE was negative for endocarditis.  Continue IV nafcillin. - Discussed w/ ID and pt. Will likely need 4-6 weeks of IV abx.    2.  Discitis/paraspinous abscess- patient underwent a MRI of the thoracic spine which showed heterogeneous paraspinous abscess at the level of T9-T10. -Continue IV nafcillin.  - Status post IR guided drainage of the paraspinous abscess.   Follow-up wound cultures.  3.  MSSA bacteremia-secondary to #2. -Continue IV nafcillin.  As mentioned TEE was negative for endocarditis.  Repeat cultures on 6/24 are currently negative.  ID is following. - will need IV abx for 4-6 weeks but given pt's hx of cocaine abuse ID does not want her to go home with PICC line and pt. Does not want to go to SNF.   4. Left knee septic Arthritis/left toe infection - s/p washout and I & D and currently stable.  - cont. abx as mentioned above with nafcillin.   5.  Anxiety/depression- continue Wellbutrin, Xanax.  6.  GERD- continue Protonix.  7.  Hypokalemia- improved with supplementation.  Potassium is low again, potassium supplement. - Mg. Level was normal.   I discussed with Dr. Ramon Dredge. All the records are reviewed and case discussed with Care Management/Social Worker. Management plans discussed with the patient, family and they are in  agreement.  CODE STATUS: Full code  DVT Prophylaxis: Ted's & SCD's.   TOTAL TIME TAKING CARE OF THIS PATIENT: 30 minutes.   POSSIBLE D/C unclear DAYS, DEPENDING ON CLINICAL CONDITION and progress.   Demetrios Loll M.D on 10/23/2018 at 12:47 PM  Between 7am to 6pm - Pager - [phone]  After 6pm go to [email or web address] - password [email or web address] [facility]  Office  [phone]  CC: Primary care physician; Marguerita Merles, MD

## 2018-10-23 NOTE — Progress Notes (Signed)
Pt complains of vaginal yeast infection.  Received order from [facility] for diflucan. Dorna Bloom RN

## 2018-10-23 NOTE — Progress Notes (Signed)
Pharmacy Antibiotic Note  [name] is a 53 y.o. female admitted on 10/13/2018 with pneumonia.  Pharmacy has been consulted for vanc/cefepime dosing. Patient received vanc 1.5g IV load and cefepime 2g IV x 1; however, patient also has a h/o staph MSSA bacteremia currently on continuous infusion nafcillin w/ thoracic/lumbar osteomyelitis drainage as well as swollen and erythematous LLE toe. Patient is now being placed on broad spectrum abx for a newly developing pnuemonia on CXR.  Plan: Vancomycin 1500 mg IV Q 24 hrs. Goal AUC 400-550. Expected AUC: 580.2 SCr used: 0.8 mg/dL Cssmin: 11.2 mcg/mL Will continue cefepime 2g IV q8h and will continue to monitor renal fx and s/sx of resolution of PNA.  Height: 5\' 4"  (162.6 cm) Weight: 177 lb 14.6 oz (80.7 kg) IBW/kg (Calculated) : 54.7  Temp (24hrs), Avg:98.7 F (37.1 C), Min:98.5 F (36.9 C), Max:98.8 F (37.1 C)  Recent Labs  Lab 10/19/18 0400 10/21/18 0147 10/22/18 0407 10/23/18 0210  WBC 16.5* 14.6* 13.1* 13.9*  CREATININE 0.56 0.48  --  0.54    Estimated Creatinine Clearance: 85.5 mL/min (by C-G formula based on SCr of 0.54 mg/dL).    Allergies  Allergen Reactions  . Topiramate Hives    Thank you for allowing pharmacy to be a part of this patient's care.  Tobie Lords, PharmD, BCPS Clinical Pharmacist 10/23/2018 5:41 AM

## 2018-10-23 NOTE — Progress Notes (Signed)
Date of Admission:  10/13/2018     Subjective: Pt sitting in bed and eating Says she has rt sided chest pain more with moving or deep breath, this started after the procedure last evening No cough No fever   Medications:  . buPROPion  300 mg Oral Daily  . diazepam  1 mg Oral BID  . docusate sodium  100 mg Oral BID  . escitalopram  30 mg Oral Daily  . fentaNYL  1 patch Transdermal Q72H  . fluticasone  1 spray Each Nare Daily  . pantoprazole  40 mg Oral BID  . potassium chloride  20 mEq Oral Once  . sodium chloride flush  10-40 mL Intracatheter Q12H    Objective: Vital signs in last 24 hours: Temp:  [98.5 F (36.9 C)-98.8 F (37.1 C)] 98.8 F (37.1 C) (07/01 0522) Pulse Rate:  [78-86] 79 (07/01 1718) Resp:  [18] 18 (07/01 1718) BP: (116-145)/(72-96) 131/96 (07/01 1718) SpO2:  [91 %-96 %] 96 % (07/01 1718)  PHYSICAL EXAM:  General: Alert, cooperative, no distress, appears stated age.  Head: Normocephalic, without obvious abnormality, atraumatic. Eyes: Conjunctivae clear, anicteric sclerae. Pupils are equal ENT Nares normal. No drainage or sinus tenderness. Lips, mucosa, and tongue normal. No Thrush Neck: Supple, symmetrical, no adenopathy, thyroid: non tender no carotid bruit and no JVD. Back: tenderness rt paraspinal area- and point tenderness over the rt chest anteriorly  Lungs: b/l air entry decreased rt side in the base Heart: Regular rate and rhythm, no murmur, rub or gallop. Abdomen: Soft, non-tender,not distended. Bowel sounds normal. No masses Extremities: atraumatic, no cyanosis. No edema. No clubbing Skin: No rashes or lesions. Or bruising Lymph: Cervical, supraclavicular normal. Neurologic: Grossly non-focal  Lab Results Recent Labs    10/21/18 0147  10/22/18 0407 10/23/18 0210  WBC 14.6*  --  13.1* 13.9*  HGB 8.6*  --  8.5* 8.7*  HCT 26.5*  --  26.2* 27.7*  NA 134*  --   --  135  K 2.4*   < > 3.3* 3.2*  CL 97*  --   --  100  CO2 27  --   --   24  BUN <5*  --   --  6  CREATININE 0.48  --   --  0.54   < > = values in this interval not displayed.   Liver Panel No results for input(s): PROT, ALBUMIN, AST, ALT, ALKPHOS, BILITOT, BILIDIR, IBILI in the last 72 hours. Sedimentation Rate No results for input(s): ESRSEDRATE in the last 72 hours. C-Reactive Protein No results for input(s): CRP in the last 72 hours.  Microbiology:  Studies/Results:     Dg Chest Port 1 View  Result Date: 10/23/2018 CLINICAL DATA:  Cough EXAM: PORTABLE CHEST 1 VIEW COMPARISON:  Chest CT 10/17/2018 chest x-ray 10/15/2018 FINDINGS: Worsening airspace disease within the right mid and lower lung. Persistent left basilar opacity, slightly improved. Suspect layering right effusion. Heart is borderline in size. IMPRESSION: Improving left basilar airspace opacity/infiltrate. New/worsening right mid and lower lung consolidation. Suspect layering right effusion. Electronically Signed   By: Rolm Baptise M.D.   On: 10/23/2018 00:55   Ct Image Guided Drainage By Percutaneous Catheter  Result Date: 10/22/2018 INDICATION: Paraspinal abscess EXAM: CT GUIDED DRAINAGE OF PARASPINAL ABSCESS MEDICATIONS: The patient is currently admitted to the hospital and receiving intravenous antibiotics. The antibiotics were administered within an appropriate time frame prior to the initiation of the procedure. ANESTHESIA/SEDATION: None. COMPLICATIONS: None immediate. TECHNIQUE: Informed written  consent was obtained from the patient after a thorough discussion of the procedural risks, benefits and alternatives. All questions were addressed. Maximal Sterile Barrier Technique was utilized including caps, mask, sterile gowns, sterile gloves, sterile drape, hand hygiene and skin antiseptic. A timeout was performed prior to the initiation of the procedure. Local anesthesia was administered with 1% lidocaine. 18 gauge needle was advanced into the right paraspinal abscess and 7 cc of bloody fluid  removed. There no complications. Patient remained stable postprocedure. Follow up with her MD. PROCEDURE: As above. FINDINGS: 7 cc bloody fluid removed from right paraspinal fluid collection. Fluid sent to microbiology for further evaluation. IMPRESSION: Successful right paraspinal abscess/fluid collection aspiration. Fluid sent to microbiology for aerobic and anaerobic cultures and sensitivities. Electronically Signed   By: Marcello Moores  Register   On: 10/22/2018 12:55     Assessment/Plan: 53 year old female with history of substance use presents with left knee pain, left toe pain and purplish discoloration and also lethargy. Also has been having back pain especially neck pain for which she had 2 prior ED visits on 6/10 and 10/04/2018.  New rt side-chest pain  looks MSK , but started after the rt paraspinal  drainage procedure she had yesterday- CXR shows infiltrate in the rt side but she does not behave like pneumonia- no fever, no cough, no tachycardia, no sob R/o pleuritic chest pain -- lung contusion/- Will get procalcitonin- if high will expand antibiotic coverage May need chest CT tomorrow if the pain persist For incentive spirometry  Disseminated staph aureus infection withStaph aureus bacteremia.In a patient with cocaineuse( She denies IVDA) left knee septic arthritis  left toe infection- s/p I/D .TEE neg for endocarditis .On nafcillin  Thoracic vertebral infection- Prominent paraspinous phlegmon and enhancement seen extending from T9 through T11, worse on the right. There is a heterogeneous paraspinous abscess at the level of T9-10 that measures 3.0 x 1.4 x 4.6 cm. Drain placement could not be done by IR because of movt. IR aspirated 7 cc bloody fluid  from right paraspinal fluid collection on 10/22/18. Fluid sent to microbiology for further evaluation.  Abnormal LFTs-HIV and hepatitis panel negative AST more than ALT.high alkaline phosphataseimproving-ultrasound GB  no stone  Pt abuses cocaine , but denies IVDA/  There is a concern to send her home with PICC. Discussed the management with patient and her  nurse

## 2018-10-23 NOTE — Progress Notes (Signed)
PT Cancellation Note  Patient Details Name: Roxi Hlavaty MRN: [id number] DOB: 1965-09-05   Cancelled Treatment:    Reason Eval/Treat Not Completed: Patient declined, no reason specified, patient initially agrees, although then declines reporting chest is hurting due to her pneumonia. Will re-attempt tomorrow.    Jhoanna Heyde 10/23/2018, 3:30 PM

## 2018-10-23 NOTE — Consult Note (Signed)
PHARMACY CONSULT NOTE - FOLLOW UP  Pharmacy Consult for Electrolyte Monitoring and Replacement   Recent Labs: Potassium (mmol/L)  Date Value  10/23/2018 3.2 (L)  06/02/2011 3.8   Magnesium (mg/dL)  Date Value  10/21/2018 2.4   Calcium (mg/dL)  Date Value  10/23/2018 7.9 (L)   Calcium, Total (mg/dL)  Date Value  06/02/2011 9.1   Albumin (g/dL)  Date Value  10/19/2018 1.8 (L)  11/27/2016 3.5  06/02/2011 3.8   Phosphorus (mg/dL)  Date Value  10/21/2018 3.6   Sodium (mmol/L)  Date Value  10/23/2018 135  11/27/2016 141  06/02/2011 140    Assessment: Pharmacy consulted for electrolyte monitoring and replacement in 53 yo female admitted with sepsis secondary to MSSA bacteremia.   Goal of Therapy:  Electrolytes WNL   Plan:  7/1 K 3.2. Potassium decreased from 3.3 to 3.2 despite supplementation with a total of 60 mEq potassium yesterday. Will give potassium 40 mEq PO x 2 doses followed by an additional 20 mEq PO this evening for a total of 100 mEq.  Will check potassium and magnesium with morning labs.  Pharmacy will continue to monitor and replace electrolytes as needed.   Tawnya Crook, PharmD Clinical Pharmacist 10/23/2018 7:34 AM

## 2018-10-23 NOTE — Progress Notes (Signed)
Notified Dr. Jannifer Franklin pt c/o 10/10 chest pain with no relief from pain medication. Stat  orders were placed for EKG, CXR, Nitro, Labs. Later after after reading results of tests new orders were placed for IV antibiotics.

## 2018-10-24 DIAGNOSIS — G0491 Myelitis, unspecified: Secondary | ICD-10-CM

## 2018-10-24 LAB — MRSA PCR SCREENING: MRSA by PCR: NEGATIVE

## 2018-10-24 LAB — MAGNESIUM: Magnesium: 1.7 mg/dL (ref 1.7–2.4)

## 2018-10-24 LAB — POTASSIUM: Potassium: 3.2 mmol/L — ABNORMAL LOW (ref 3.5–5.1)

## 2018-10-24 MED ORDER — ENSURE ENLIVE PO LIQD
237.0000 mL | Freq: Two times a day (BID) | ORAL | Status: DC
  Administered 2018-10-24 – 2018-11-09 (×19): 237 mL via ORAL

## 2018-10-24 MED ORDER — ADULT MULTIVITAMIN W/MINERALS CH
1.0000 | ORAL_TABLET | Freq: Every day | ORAL | Status: DC
  Administered 2018-10-25 – 2018-11-11 (×18): 1 via ORAL
  Filled 2018-10-24 (×18): qty 1

## 2018-10-24 MED ORDER — POTASSIUM CHLORIDE CRYS ER 20 MEQ PO TBCR
40.0000 meq | EXTENDED_RELEASE_TABLET | ORAL | Status: AC
  Administered 2018-10-24 (×3): 40 meq via ORAL
  Filled 2018-10-24 (×3): qty 2

## 2018-10-24 MED ORDER — MAGNESIUM SULFATE 2 GM/50ML IV SOLN
2.0000 g | Freq: Once | INTRAVENOUS | Status: AC
  Administered 2018-10-24: 2 g via INTRAVENOUS
  Filled 2018-10-24: qty 50

## 2018-10-24 NOTE — Progress Notes (Signed)
ID Subjective C/o pain rt chest on deep breath and movt  Objective Patient Vitals for the past 24 hrs:  BP Temp Temp src Pulse Resp SpO2  10/24/18 1300 138/70 98.7 F (37.1 C) - 78 18 96 %  10/24/18 0642 (!) 142/73 - - 77 18 95 %  10/23/18 1933 127/66 98.4 F (36.9 C) Oral 86 (!) 22 98 %  10/23/18 1718 (!) 131/96 - - 79 18 96 %    Chest- b/l air entry  HS : s1s2  Abd: soft   CNS: non focal   Labs procalcitonin < 0.10 CBC Latest Ref Rng & Units 10/23/2018 10/22/2018 10/21/2018  WBC 4.0 - 10.5 K/uL 13.9(H) 13.1(H) 14.6(H)  Hemoglobin 12.0 - 15.0 g/dL 8.7(L) 8.5(L) 8.6(L)  Hematocrit 36.0 - 46.0 % 27.7(L) 26.2(L) 26.5(L)  Platelets 150 - 400 K/uL 817(H) 743(H) 706(H)    CMP Latest Ref Rng & Units 10/24/2018 10/23/2018 10/22/2018  Glucose 70 - 99 mg/dL - 96 -  BUN 6 - 20 mg/dL - 6 -  Creatinine 0.44 - 1.00 mg/dL - 0.54 -  Sodium 135 - 145 mmol/L - 135 -  Potassium 3.5 - 5.1 mmol/L 3.2(L) 3.2(L) 3.3(L)  Chloride 98 - 111 mmol/L - 100 -  CO2 22 - 32 mmol/L - 24 -  Calcium 8.9 - 10.3 mg/dL - 7.9(L) -  Total Protein 6.5 - 8.1 g/dL - - -  Total Bilirubin 0.3 - 1.2 mg/dL - - -  Alkaline Phos 38 - 126 U/L - - -  AST 15 - 41 U/L - - -  ALT 0 - 44 U/L - - -    53 year old female with history of substance use presents with left knee pain, left toe pain and purplish discoloration and also lethargy. Also has been having back pain especially neck pain for which she had 2 prior ED visits on 6/10 and 10/04/2018.  New rt side-chest pain  lloos pleuritic/MSK , but started after the rt paraspinal  drainage procedure she had yesterday- CXR shows infiltrate in the rt side but she does not behave like pneumonia- no fever, no cough, no tachycardia, no sob, very low procalcitonin R/o pleuritic chest pain -- lung contusion/- May need chest CT  if the pain persist For incentive spirometry  Disseminated staph aureus infection withStaph aureus bacteremia.In a patient with cocaineuse( She  denies IVDA) left knee septic arthritis  left toe infection- s/p I/D .TEE neg for endocarditis .On nafcillin  Thoracic vertebral infection- Prominent paraspinous phlegmon and enhancement seen extending from T9 through T11, worse on the right. There is a heterogeneous paraspinous abscess at the level of T9-10 that measures 3.0 x 1.4 x 4.6 cm. Drain placement could not be done by IR because of movt. IR aspirated 7 cc bloody fluid  from right paraspinal fluid collection on 10/22/18. Fluid sent to microbiology for further evaluation.  Abnormal LFTs-HIV and hepatitis panel negative AST more than ALT.high alkaline phosphataseimproving-ultrasound GB no stone  Pt abuses cocaine , but denies IVDA/  There is a concern to send her home with PICC. Discussed the management with patient and hospitalist Will follow peripherally this weekend- call if needed

## 2018-10-24 NOTE — Progress Notes (Signed)
Physical Therapy Treatment Patient Details Name: [name] MRN: [id number] DOB: [birth year] Today's Date: 10/24/2018    History of Present Illness 54 y/o arrives with swollen L knee and toes, sepsis.  S/p I&Ds 6/24.      PT Comments    Pt states that she has been up to the chair and doesn't feel like she needs to get up any more today, despite encouragement and cuing she flatly refuses.  Ultimately he hesitantly agrees to do some minimal bed exercises but again needs consistent encouragement and cuing to perform and stay on task.  Pt did not c/o excessive pain with the effort and does have much more available ROM than last time this PT worked with her.  Discussed with nursing post session to encourage PolarCare use and safe activity as she is able.     Follow Up Recommendations  SNF     Equipment Recommendations  Rolling walker with 5" wheels    Recommendations for Other Services       Precautions / Restrictions Precautions Precautions: Fall Restrictions Weight Bearing Restrictions: Yes LLE Weight Bearing: Weight bearing as tolerated    Mobility  Bed Mobility               General bed mobility comments: Pt reports that she was up in the chair earlier today and flatly refuses to get out of bed at this time  Transfers                    Ambulation/Gait                 Stairs             Wheelchair Mobility    Modified Rankin (Stroke Patients Only)       Balance                                            Cognition Arousal/Alertness: Awake/alert Behavior During Therapy: WFL for tasks assessed/performed Overall Cognitive Status: Within Functional Limits for tasks assessed                                        Exercises Total Joint Exercises Goniometric ROM: Pt has good ROM ~2 to 100 degrees General Exercises - Lower Extremity Ankle Circles/Pumps: Strengthening;10 reps Quad Sets: Strengthening;10  reps Short Arc Quad: Strengthening;10 reps Heel Slides: Strengthening;10 reps Hip ABduction/ADduction: Strengthening;10 reps Straight Leg Raises: Strengthening;10 reps    General Comments        Pertinent Vitals/Pain Pain Assessment: 0-10 Pain Score: 4  Pain Location: L knee     Home Living                      Prior Function            PT Goals (current goals can now be found in the care plan section) Progress towards PT goals: Progressing toward goals    Frequency    Min 2X/week      PT Plan Current plan remains appropriate    Co-evaluation              AM-PAC PT "6 Clicks" Mobility   Outcome Measure  Help needed turning from your back to your side while in a flat  bed without using bedrails?: None Help needed moving from lying on your back to sitting on the side of a flat bed without using bedrails?: None Help needed moving to and from a bed to a chair (including a wheelchair)?: None Help needed standing up from a chair using your arms (e.g., wheelchair or bedside chair)?: A Lot Help needed to walk in hospital room?: A Lot Help needed climbing 3-5 steps with a railing? : Total 6 Click Score: 17    End of Session   Activity Tolerance: Patient limited by fatigue;Patient limited by pain Patient left: with bed alarm set;with call bell/phone within reach Nurse Communication: (encourage PolarCare use) PT Visit Diagnosis: Difficulty in walking, not elsewhere classified (R26.2);Muscle weakness (generalized) (M62.81)     Time: 8466-5993 PT Time Calculation (min) (ACUTE ONLY): 27 min  Charges:  $Therapeutic Exercise: 23-37 mins                     Kreg Shropshire, DPT 10/24/2018, 3:19 PM

## 2018-10-24 NOTE — Progress Notes (Signed)
Initial Nutrition Assessment  RD working remotely.  DOCUMENTATION CODES:   Not applicable  INTERVENTION:  Provide Ensure Enlive po BID, each supplement provides 350 kcal and 20 grams of protein. Patient prefers chocolate and would like a carton of milk with each Ensure.  Encouraged adequate intake of protein at meals.  Provide daily MVI.  NUTRITION DIAGNOSIS:   Inadequate oral intake related to decreased appetite as evidenced by per patient/family report.  GOAL:   Patient will meet greater than or equal to 90% of their needs  MONITOR:   PO intake, Supplement acceptance, Labs, Weight trends, Skin, I & O's  REASON FOR ASSESSMENT:   LOS    ASSESSMENT:   53 year old female with PMHx of anxiety, depression, substance abuse, arthritis, back pain admitted with sepsis, discitis/paraspinous abscess s/p IR guided drainage, MSSA bacteremia, left knee septic arthritis s/p I&D.   -Per chart patient will need long-term antibiotics per PICC. Pending discharge plan as there is concern to send patient home with PICC.  Spoke with patient over the phone. She reports she typically has a good appetite and intake at baseline. She has been experiencing taste changes here and it not eating as well here. PO intake in chart appears variable (0-100%). Patient is amenable to drinking chocolate ONS between meals. She requests milk with each supplement in case it is too thick for her to tolerate.  Patient denies any unintentional weight loss and reports she is weight-stable. She is 80.7 kg (177.91 lbs).  Medications reviewed and include: fentanyl patch, pantoprazole, potassium chloride 40 mEq 3 times today, NS at 20 mL/hr, Nafcillin IV.  Labs reviewed: Potassium 3.2.  NUTRITION - FOCUSED PHYSICAL EXAM:  Unable to complete at this time.  Diet Order:   Diet Order            Diet regular Room service appropriate? Yes; Fluid consistency: Thin  Diet effective now             EDUCATION NEEDS:    Education needs have been addressed  Skin:  Skin Assessment: Skin Integrity Issues:(closed incision left knee, closed incision left toe)  Last BM:  10/23/2018 per chart  Height:   Ht Readings from Last 1 Encounters:  10/18/18 5\' 4"  (1.626 m)   Weight:   Wt Readings from Last 1 Encounters:  10/18/18 80.7 kg   Ideal Body Weight:  54.5 kg  BMI:  Body mass index is 30.54 kg/m.  Estimated Nutritional Needs:   Kcal:  1800-2000  Protein:  90-100 grams  Fluid:  1.8-2 L/day  Willey Blade, MS, RD, LDN Office: [phone] Pager: [phone] After Hours/Weekend Pager: [phone]

## 2018-10-24 NOTE — Progress Notes (Signed)
[place] at [place] NAME: [name]    MR#:  [id number]  DATE OF BIRTH:  Dec 12, 1965  SUBJECTIVE:   Patient is status post IR drainage of the right paraspinous abscess. The patient has no complaints. REVIEW OF SYSTEMS:    Review of Systems  Constitutional: Negative for chills and fever.  HENT: Negative for congestion and tinnitus.   Eyes: Negative for blurred vision and double vision.  Respiratory: Negative for cough, shortness of breath and wheezing.   Cardiovascular: Negative for chest pain, orthopnea and PND.  Gastrointestinal: Negative for abdominal pain, diarrhea, nausea and vomiting.  Genitourinary: Negative for dysuria and hematuria.  Musculoskeletal: Positive for back pain.  Skin: Negative for rash.  Neurological: Negative for dizziness, sensory change and focal weakness.  Psychiatric/Behavioral: Negative for depression. The patient is not nervous/anxious.   All other systems reviewed and are negative.   Nutrition: Regular Tolerating Diet: Yes Tolerating PT: Ambulatory   DRUG ALLERGIES:   Allergies  Allergen Reactions  . Topiramate Hives    VITALS:  Blood pressure 138/70, pulse 78, temperature 98.7 F (37.1 C), resp. rate 18, height 5\' 4"  (1.626 m), weight 80.7 kg, last menstrual period 01/22/2017, SpO2 96 %.  PHYSICAL EXAMINATION:   Physical Exam  GENERAL:  53 y.o.-year-old patient lying in bed in no acute distress.  EYES: Pupils equal, round, reactive to light and accommodation. No scleral icterus. Extraocular muscles intact.  HEENT: Head atraumatic, normocephalic. Oropharynx and nasopharynx clear.  NECK:  Supple, no jugular venous distention. No thyroid enlargement, no tenderness.  LUNGS: Normal breath sounds bilaterally, no wheezing, rales, rhonchi. No use of accessory muscles of respiration.  CARDIOVASCULAR: S1, S2 normal. No murmurs, rubs, or gallops.  ABDOMEN: Soft, nontender, nondistended. Bowel sounds  present. No organomegaly or mass.  EXTREMITIES: No cyanosis, clubbing or edema b/l.    NEUROLOGIC: Cranial nerves II through XII are intact. No focal Motor or sensory deficits b/l. Globally weak.   PSYCHIATRIC: The patient is alert and oriented x 3.  SKIN: No obvious rash, lesion, or ulcer.  LABORATORY PANEL:   CBC Recent Labs  Lab 10/23/18 0210  WBC 13.9*  HGB 8.7*  HCT 27.7*  PLT 817*   ------------------------------------------------------------------------------------------------------------------  Chemistries  Recent Labs  Lab 10/19/18 0400  10/23/18 0210 10/24/18 0348  NA 134*   < > 135  --   K 3.1*   < > 3.2* 3.2*  CL 100   < > 100  --   CO2 26   < > 24  --   GLUCOSE 96   < > 96  --   BUN 5*   < > 6  --   CREATININE 0.56   < > 0.54  --   CALCIUM 7.5*   < > 7.9*  --   MG  --    < >  --  1.7  AST 20  --   --   --   ALT 15  --   --   --   ALKPHOS 312*  --   --   --   BILITOT 1.4*  --   --   --    < > = values in this interval not displayed.   ------------------------------------------------------------------------------------------------------------------  Cardiac Enzymes No results for input(s): TROPONINI in the last 168 hours. ------------------------------------------------------------------------------------------------------------------  RADIOLOGY:  Dg Chest Port 1 View  Result Date: 10/23/2018 CLINICAL DATA:  Cough EXAM: PORTABLE CHEST 1 VIEW COMPARISON:  Chest  CT 10/17/2018 chest x-ray 10/15/2018 FINDINGS: Worsening airspace disease within the right mid and lower lung. Persistent left basilar opacity, slightly improved. Suspect layering right effusion. Heart is borderline in size. IMPRESSION: Improving left basilar airspace opacity/infiltrate. New/worsening right mid and lower lung consolidation. Suspect layering right effusion. Electronically Signed   By: Rolm Baptise M.D.   On: 10/23/2018 00:55     ASSESSMENT AND PLAN:   53 year old female with past  medical history of anxiety/depression, chronic back pain, osteoarthritis who presented to the hospital due to suspected sepsis.  1.  Sepsis- patient presented to the hospital with fever, tachycardia and leukocytosis. -Source of sepsis is disseminated staph infection from underlying paraspinous fluid collection from paraspinous abscess/discitis and also left knee septic arthritis.  -TEE was negative for endocarditis.  Continue IV nafcillin. - Discussed w/ ID and pt. Will likely need 4-6 weeks of IV abx.   Still leukocytosis, follow-up CBC.  2.  Discitis/paraspinous abscess- patient underwent a MRI of the thoracic spine which showed heterogeneous paraspinous abscess at the level of T9-T10. -Continue IV nafcillin.  - Status post IR guided drainage of the paraspinous abscess.   Follow-up wound cultures: NO GROWTH 2 DAYS NO ANAEROBES ISOLATED; CULTURE IN PROGRESS FOR 5 DAYS .  3.  MSSA bacteremia-secondary to #2. -Continue IV nafcillin.  As mentioned TEE was negative for endocarditis.  Repeat cultures on 6/24 are currently negative.  ID is following. - will need IV abx for 4-6 weeks but given pt's hx of cocaine abuse ID does not want her to go home with PICC line and pt. Does not want to go to SNF.   4. Left knee septic Arthritis/left toe infection - s/p washout and I & D and currently stable.  - cont. abx as mentioned above with nafcillin.   5.  Anxiety/depression- continue Wellbutrin, Xanax.  6.  GERD- continue Protonix.  7.  Hypokalemia- improved with supplementation.  Potassium is low again, potassium supplement.  All the records are reviewed and case discussed with Care Management/Social Worker. Management plans discussed with the patient, family and they are in agreement.  CODE STATUS: Full code  DVT Prophylaxis: Ted's & SCD's.   TOTAL TIME TAKING CARE OF THIS PATIENT: 26 minutes.   POSSIBLE D/C unclear DAYS, DEPENDING ON CLINICAL CONDITION and progress.   Demetrios Loll M.D on  10/24/2018 at 2:53 PM  Between 7am to 6pm - Pager - [phone]  After 6pm go to [email or web address] - password [email or web address] [facility]  Office  [phone]  CC: Primary care physician; Marguerita Merles, MD

## 2018-10-24 NOTE — Progress Notes (Signed)
  Subjective:  Patient reports pain as mild.  Has been up walking.  Objective:   VITALS:   Vitals:   10/23/18 0522 10/23/18 1718 10/23/18 1933 10/24/18 0642  BP: 116/89 (!) 131/96 127/66 (!) 142/73  Pulse: 86 79 86 77  Resp:  18 (!) 22 18  Temp: 98.8 F (37.1 C)  98.4 F (36.9 C)   TempSrc: Oral  Oral   SpO2: 91% 96% 98% 95%  Weight:      Height:        PHYSICAL EXAM:  Sensation intact distally Dorsiflexion/Plantar flexion intact Incision: no drainage No cellulitis present Compartment soft  LABS  Results for orders placed or performed during the hospital encounter of 10/13/18 (from the past 24 hour(s))  Potassium     Status: Abnormal   Collection Time: 10/24/18  3:48 AM  Result Value Ref Range   Potassium 3.2 (L) 3.5 - 5.1 mmol/L  Magnesium     Status: None   Collection Time: 10/24/18  3:48 AM  Result Value Ref Range   Magnesium 1.7 1.7 - 2.4 mg/dL    Dg Chest Port 1 View  Result Date: 10/23/2018 CLINICAL DATA:  Cough EXAM: PORTABLE CHEST 1 VIEW COMPARISON:  Chest CT 10/17/2018 chest x-ray 10/15/2018 FINDINGS: Worsening airspace disease within the right mid and lower lung. Persistent left basilar opacity, slightly improved. Suspect layering right effusion. Heart is borderline in size. IMPRESSION: Improving left basilar airspace opacity/infiltrate. New/worsening right mid and lower lung consolidation. Suspect layering right effusion. Electronically Signed   By: Rolm Baptise M.D.   On: 10/23/2018 00:55    Assessment/Plan: 6 Days Post-Op   Active Problems:   Sepsis ([place])   Substance abuse ([place])   Up with therapy  Will remove sutures tomorrow. May follow-up as needed.   Lovell Sheehan , MD 10/24/2018, 1:20 PM

## 2018-10-24 NOTE — Consult Note (Signed)
PHARMACY CONSULT NOTE - FOLLOW UP  Pharmacy Consult for Electrolyte Monitoring and Replacement   Recent Labs: Potassium (mmol/L)  Date Value  10/24/2018 3.2 (L)  06/02/2011 3.8   Magnesium (mg/dL)  Date Value  10/24/2018 1.7   Calcium (mg/dL)  Date Value  10/23/2018 7.9 (L)   Calcium, Total (mg/dL)  Date Value  06/02/2011 9.1   Albumin (g/dL)  Date Value  10/19/2018 1.8 (L)  11/27/2016 3.5  06/02/2011 3.8   Phosphorus (mg/dL)  Date Value  10/21/2018 3.6   Sodium (mmol/L)  Date Value  10/23/2018 135  11/27/2016 141  06/02/2011 140    Assessment: Pharmacy consulted for electrolyte monitoring and replacement in 53 yo female admitted with sepsis secondary to MSSA bacteremia.   Goal of Therapy: Electrolytes WNL   Plan:  7/2 K 3.2. Patient received 100 mEq total yesterday with no change in K. Will order 40 mEq PO x 3 doses today.  Mg 1.7 on low end of normal. Will give Mg 2 g IV x 1.  Will check potassium and magnesium with morning labs.  Pharmacy will continue to monitor and replace electrolytes as needed.   Tawnya Crook, PharmD Clinical Pharmacist 10/24/2018 7:17 AM

## 2018-10-24 NOTE — Progress Notes (Signed)
Dr Bridgett Larsson notified that patient is having 9 out of 10 pain in chest and has had pain in chest for couple of days. MD acknowledged, no new orders given.

## 2018-10-25 ENCOUNTER — Inpatient Hospital Stay: Payer: Medicaid Other

## 2018-10-25 LAB — MAGNESIUM: Magnesium: 1.8 mg/dL (ref 1.7–2.4)

## 2018-10-25 LAB — CBC
HCT: 29.4 % — ABNORMAL LOW (ref 36.0–46.0)
Hemoglobin: 8.9 g/dL — ABNORMAL LOW (ref 12.0–15.0)
MCH: 30.8 pg (ref 26.0–34.0)
MCHC: 30.3 g/dL (ref 30.0–36.0)
MCV: 101.7 fL — ABNORMAL HIGH (ref 80.0–100.0)
Platelets: 718 10*3/uL — ABNORMAL HIGH (ref 150–400)
RBC: 2.89 MIL/uL — ABNORMAL LOW (ref 3.87–5.11)
RDW: 16.2 % — ABNORMAL HIGH (ref 11.5–15.5)
WBC: 14.5 10*3/uL — ABNORMAL HIGH (ref 4.0–10.5)
nRBC: 0 % (ref 0.0–0.2)

## 2018-10-25 LAB — POTASSIUM: Potassium: 3.8 mmol/L (ref 3.5–5.1)

## 2018-10-25 MED ORDER — MORPHINE SULFATE (PF) 4 MG/ML IV SOLN
4.0000 mg | INTRAVENOUS | Status: DC | PRN
  Administered 2018-10-25 – 2018-10-28 (×16): 4 mg via INTRAVENOUS
  Filled 2018-10-25 (×16): qty 1

## 2018-10-25 MED ORDER — MORPHINE SULFATE (PF) 2 MG/ML IV SOLN
2.0000 mg | Freq: Once | INTRAVENOUS | Status: AC
  Administered 2018-10-25: 07:00:00 2 mg via INTRAVENOUS
  Filled 2018-10-25: qty 1

## 2018-10-25 NOTE — Progress Notes (Signed)
Pt c/o RUQ pain 10/10 that came on suddenly. Pt moaning and writhing in bed upon entry. 2mg  Morphine given IV. MD made aware. Orders for ultrasound and NPO status placed. Will continue to monitor.

## 2018-10-25 NOTE — Consult Note (Signed)
PHARMACY CONSULT NOTE - FOLLOW UP  Pharmacy Consult for Electrolyte Monitoring and Replacement   Recent Labs: Potassium (mmol/L)  Date Value  10/25/2018 3.8  06/02/2011 3.8   Magnesium (mg/dL)  Date Value  10/25/2018 1.8   Calcium (mg/dL)  Date Value  10/23/2018 7.9 (L)   Calcium, Total (mg/dL)  Date Value  06/02/2011 9.1   Albumin (g/dL)  Date Value  10/19/2018 1.8 (L)  11/27/2016 3.5  06/02/2011 3.8   Phosphorus (mg/dL)  Date Value  10/21/2018 3.6   Sodium (mmol/L)  Date Value  10/23/2018 135  11/27/2016 141  06/02/2011 140    Assessment: Pharmacy consulted for electrolyte monitoring and replacement in 53 yo female admitted with sepsis secondary to MSSA bacteremia.   Goal of Therapy: Electrolytes WNL   Plan:  7/2 K 3.8, Mg 1.8 WNL. No supplementation required today.   Will check potassium with morning labs.  Pharmacy will continue to monitor and replace electrolytes as needed.   Tawnya Crook, PharmD Clinical Pharmacist 10/25/2018 7:50 AM

## 2018-10-25 NOTE — Progress Notes (Signed)
[place] at [place] NAME: [name]    MR#:  [id number]  DATE OF BIRTH:  1965/05/02  SUBJECTIVE:   The patient complains of right upper quadrant pain since 4 AM today, which is constant, 10 out of 10, sharp without radiation.  She wants more pain medication. REVIEW OF SYSTEMS:    Review of Systems  Constitutional: Negative for chills and fever.  HENT: Negative for congestion and tinnitus.   Eyes: Negative for blurred vision and double vision.  Respiratory: Negative for cough, shortness of breath and wheezing.   Cardiovascular: Negative for chest pain, orthopnea and PND.  Gastrointestinal: Positive for abdominal pain. Negative for diarrhea, nausea and vomiting.  Genitourinary: Negative for dysuria and hematuria.  Musculoskeletal: Positive for back pain.  Skin: Negative for rash.  Neurological: Negative for dizziness, sensory change and focal weakness.  Psychiatric/Behavioral: Negative for depression. The patient is not nervous/anxious.   All other systems reviewed and are negative.   Nutrition: Regular Tolerating Diet: Yes Tolerating PT: Ambulatory   DRUG ALLERGIES:   Allergies  Allergen Reactions   Topiramate Hives    VITALS:  Blood pressure 135/79, pulse 90, temperature 98 F (36.7 C), resp. rate 18, height 5\' 4"  (1.626 m), weight 80.7 kg, last menstrual period 01/22/2017, SpO2 91 %.  PHYSICAL EXAMINATION:   Physical Exam  GENERAL:  53 y.o.-year-old patient lying in bed in no acute distress.  EYES: Pupils equal, round, reactive to light and accommodation. No scleral icterus. Extraocular muscles intact.  HEENT: Head atraumatic, normocephalic. Oropharynx and nasopharynx clear.  NECK:  Supple, no jugular venous distention. No thyroid enlargement, no tenderness.  LUNGS: Normal breath sounds bilaterally, no wheezing, rales, rhonchi. No use of accessory muscles of respiration.  CARDIOVASCULAR: S1, S2 normal. No murmurs,  rubs, or gallops.  ABDOMEN: Soft, diffuse tenderness, especially in RUQ, nondistended.  No rigidity, no rebound.  Bowel sounds present. No organomegaly or mass.  EXTREMITIES: No cyanosis, clubbing or edema b/l.    NEUROLOGIC: Cranial nerves II through XII are intact. No focal Motor or sensory deficits b/l. Globally weak.   PSYCHIATRIC: The patient is alert and oriented x 3.  SKIN: No obvious rash, lesion, or ulcer.  LABORATORY PANEL:   CBC Recent Labs  Lab 10/25/18 0628  WBC 14.5*  HGB 8.9*  HCT 29.4*  PLT 718*   ------------------------------------------------------------------------------------------------------------------  Chemistries  Recent Labs  Lab 10/19/18 0400  10/23/18 0210  10/25/18 0628  NA 134*   < > 135  --   --   K 3.1*   < > 3.2*   < > 3.8  CL 100   < > 100  --   --   CO2 26   < > 24  --   --   GLUCOSE 96   < > 96  --   --   BUN 5*   < > 6  --   --   CREATININE 0.56   < > 0.54  --   --   CALCIUM 7.5*   < > 7.9*  --   --   MG  --    < >  --    < > 1.8  AST 20  --   --   --   --   ALT 15  --   --   --   --   ALKPHOS 312*  --   --   --   --  BILITOT 1.4*  --   --   --   --    < > = values in this interval not displayed.   ------------------------------------------------------------------------------------------------------------------  Cardiac Enzymes No results for input(s): TROPONINI in the last 168 hours. ------------------------------------------------------------------------------------------------------------------  RADIOLOGY:  No results found.   ASSESSMENT AND PLAN:   53 year old female with past medical history of anxiety/depression, chronic back pain, osteoarthritis who presented to the hospital due to suspected sepsis.  1.  Sepsis- patient presented to the hospital with fever, tachycardia and leukocytosis. -Source of sepsis is disseminated staph infection from underlying paraspinous fluid collection from paraspinous abscess/discitis and  also left knee septic arthritis.  -TEE was negative for endocarditis.  Continue IV nafcillin. - Discussed w/ ID and pt. Will likely need 4-6 weeks of IV abx.   Still leukocytosis, follow-up CBC.  2.  Discitis/paraspinous abscess- patient underwent a MRI of the thoracic spine which showed heterogeneous paraspinous abscess at the level of T9-T10. -Continue IV nafcillin.  - Status post IR guided drainage of the paraspinous abscess.   Follow-up wound cultures: NO GROWTH 3 DAYS NO ANAEROBES ISOLATED; CULTURE IN PROGRESS FOR 5 DAYS .  3.  MSSA bacteremia-secondary to #2. -Continue IV nafcillin.  As mentioned TEE was negative for endocarditis.  Repeat cultures on 6/24 are currently negative.  ID is following. - will need IV abx for 4-6 weeks but given pt's hx of cocaine abuse ID does not want her to go home with PICC line and pt. Does not want to go to SNF.   4. Left knee septic Arthritis/left toe infection - s/p washout and I & D and currently stable.  - cont. abx as mentioned above with nafcillin.   5.  Anxiety/depression- continue Wellbutrin, Xanax.  6.  GERD- continue Protonix.  7.  Hypokalemia- improved with supplementation.   RUQ pain, abdominal ultrasound.  Pain control.  All the records are reviewed and case discussed with Care Management/Social Worker. Management plans discussed with the patient, family and they are in agreement.  CODE STATUS: Full code  DVT Prophylaxis: Ted's & SCD's.   TOTAL TIME TAKING CARE OF THIS PATIENT: 28 minutes.   POSSIBLE D/C unclear DAYS, DEPENDING ON CLINICAL CONDITION and progress.   Demetrios Loll M.D on 10/25/2018 at 11:32 AM  Between 7am to 6pm - Pager - [phone]  After 6pm go to [email or web address] - password [email or web address] [facility]  Office  [phone]  CC: Primary care physician; Marguerita Merles, MD

## 2018-10-25 NOTE — Progress Notes (Signed)
Pt complaining of chest pain 10/10. MD paged and text sent. Awaiting response.

## 2018-10-26 LAB — CBC
HCT: 27.9 % — ABNORMAL LOW (ref 36.0–46.0)
Hemoglobin: 8.9 g/dL — ABNORMAL LOW (ref 12.0–15.0)
MCH: 30.9 pg (ref 26.0–34.0)
MCHC: 31.9 g/dL (ref 30.0–36.0)
MCV: 96.9 fL (ref 80.0–100.0)
Platelets: 701 10*3/uL — ABNORMAL HIGH (ref 150–400)
RBC: 2.88 MIL/uL — ABNORMAL LOW (ref 3.87–5.11)
RDW: 15.7 % — ABNORMAL HIGH (ref 11.5–15.5)
WBC: 14.2 10*3/uL — ABNORMAL HIGH (ref 4.0–10.5)
nRBC: 0 % (ref 0.0–0.2)

## 2018-10-26 LAB — POTASSIUM: Potassium: 3.2 mmol/L — ABNORMAL LOW (ref 3.5–5.1)

## 2018-10-26 MED ORDER — POTASSIUM CHLORIDE CRYS ER 20 MEQ PO TBCR
40.0000 meq | EXTENDED_RELEASE_TABLET | Freq: Once | ORAL | Status: AC
  Administered 2018-10-26: 09:00:00 40 meq via ORAL
  Filled 2018-10-26: qty 2

## 2018-10-26 NOTE — Progress Notes (Addendum)
[place] at [place] NAME: [name]    MR#:  [id number]  DATE OF BIRTH:  Feb 15, 1966  SUBJECTIVE:   The patient has no complaints of abdominal pain, nausea or vomiting. REVIEW OF SYSTEMS:    Review of Systems  Constitutional: Negative for chills and fever.  HENT: Negative for congestion and tinnitus.   Eyes: Negative for blurred vision and double vision.  Respiratory: Negative for cough, shortness of breath and wheezing.   Cardiovascular: Negative for chest pain, orthopnea and PND.  Gastrointestinal: Negative for abdominal pain, diarrhea, nausea and vomiting.  Genitourinary: Negative for dysuria and hematuria.  Musculoskeletal: Positive for back pain.  Skin: Negative for rash.  Neurological: Negative for dizziness, sensory change and focal weakness.  Psychiatric/Behavioral: Negative for depression. The patient is not nervous/anxious.   All other systems reviewed and are negative.   Nutrition: Regular Tolerating Diet: Yes Tolerating PT: Ambulatory   DRUG ALLERGIES:   Allergies  Allergen Reactions  . Topiramate Hives    VITALS:  Blood pressure (!) 153/90, pulse 88, temperature 98.2 F (36.8 C), resp. rate 20, height 5\' 4"  (1.626 m), weight 80.7 kg, last menstrual period 01/22/2017, SpO2 92 %.  PHYSICAL EXAMINATION:   Physical Exam  GENERAL:  53 y.o.-year-old patient lying in bed in no acute distress.  EYES: Pupils equal, round, reactive to light and accommodation. No scleral icterus. Extraocular muscles intact.  HEENT: Head atraumatic, normocephalic. Oropharynx and nasopharynx clear.  NECK:  Supple, no jugular venous distention. No thyroid enlargement, no tenderness.  LUNGS: Normal breath sounds bilaterally, no wheezing, rales, rhonchi. No use of accessory muscles of respiration.  CARDIOVASCULAR: S1, S2 normal. No murmurs, rubs, or gallops.  ABDOMEN: Soft, no tenderness or distention. Bowel sounds present. No organomegaly  or mass.  EXTREMITIES: No cyanosis, clubbing or edema b/l.    NEUROLOGIC: Cranial nerves II through XII are intact. No focal Motor or sensory deficits b/l. Globally weak.   PSYCHIATRIC: The patient is alert and oriented x 3.  SKIN: No obvious rash, lesion, or ulcer.  LABORATORY PANEL:   CBC Recent Labs  Lab 10/26/18 0633  WBC 14.2*  HGB 8.9*  HCT 27.9*  PLT 701*   ------------------------------------------------------------------------------------------------------------------  Chemistries  Recent Labs  Lab 10/23/18 0210  10/25/18 0628 10/26/18 0633  NA 135  --   --   --   K 3.2*   < > 3.8 3.2*  CL 100  --   --   --   CO2 24  --   --   --   GLUCOSE 96  --   --   --   BUN 6  --   --   --   CREATININE 0.54  --   --   --   CALCIUM 7.9*  --   --   --   MG  --    < > 1.8  --    < > = values in this interval not displayed.   ------------------------------------------------------------------------------------------------------------------  Cardiac Enzymes No results for input(s): TROPONINI in the last 168 hours. ------------------------------------------------------------------------------------------------------------------  RADIOLOGY:  US Abdomen Limited Ruq  Result Date: 10/25/2018 CLINICAL DATA:  Patient with abdominal pain. EXAM: ULTRASOUND ABDOMEN LIMITED RIGHT UPPER QUADRANT COMPARISON:  MR lumbar spine 10/16/2018 FINDINGS: Gallbladder: No gallstones or wall thickening visualized. No sonographic Murphy sign noted by sonographer. Common bile duct: Diameter: 5 mm Liver: No focal lesion identified. Within normal limits in parenchymal echogenicity. Portal vein is patent  on color Doppler imaging with normal direction of blood flow towards the liver. Right pleural effusion. IMPRESSION: No cholelithiasis or sonographic evidence for acute cholecystitis. Electronically Signed   By: Lovey Newcomer M.D.   On: 10/25/2018 12:13     ASSESSMENT AND PLAN:   53 year old female with past  medical history of anxiety/depression, chronic back pain, osteoarthritis who presented to the hospital due to suspected sepsis.  1.  Sepsis- patient presented to the hospital with fever, tachycardia and leukocytosis. -Source of sepsis is disseminated staph infection from underlying paraspinous fluid collection from paraspinous abscess/discitis and also left knee septic arthritis.  -TEE was negative for endocarditis.  Continue IV nafcillin. - Discussed w/ ID and pt. Will likely need 4-6 weeks of IV abx.   Still leukocytosis, follow-up CBC.  2.  Discitis/paraspinous abscess- patient underwent a MRI of the thoracic spine which showed heterogeneous paraspinous abscess at the level of T9-T10. -Continue IV nafcillin.  - Status post IR guided drainage of the paraspinous abscess.   Follow-up wound cultures: NO GROWTH 4 DAYS NO ANAEROBES ISOLATED; CULTURE IN PROGRESS FOR 5 DAYS .  3.  MSSA bacteremia-secondary to #2. -Continue IV nafcillin.  As mentioned TEE was negative for endocarditis.  Repeat cultures on 6/24 are currently negative.  ID is following. - will need IV abx for 4-6 weeks but given pt's hx of cocaine abuse ID does not want her to go home with PICC line and pt. Does not want to go to SNF.   4. Left knee septic Arthritis/left toe infection - s/p washout and I & D and currently stable.  - cont. abx as mentioned above with nafcillin.   5.  Anxiety/depression- continue Wellbutrin, Xanax.  6.  GERD- continue Protonix.  7.  Hypokalemia- improved with supplementation.   RUQ pain, abdominal ultrasound is unremarkable.  Improved.  All the records are reviewed and case discussed with Care Management/Social Worker. Management plans discussed with the patient, family and they are in agreement.  CODE STATUS: Full code  DVT Prophylaxis: Ted's & SCD's.   TOTAL TIME TAKING CARE OF THIS PATIENT: 25 minutes.   POSSIBLE D/C 2-4 weeks, DEPENDING ON CLINICAL CONDITION and progress.   Demetrios Loll M.D on 10/26/2018 at 12:46 PM  Between 7am to 6pm - Pager - [phone]  After 6pm go to [email or web address] - password [email or web address] [facility]  Office  [phone]  CC: Primary care physician; Marguerita Merles, MD

## 2018-10-27 LAB — AEROBIC/ANAEROBIC CULTURE W GRAM STAIN (SURGICAL/DEEP WOUND)
Culture: NO GROWTH
Gram Stain: NONE SEEN

## 2018-10-27 LAB — SEDIMENTATION RATE: Sed Rate: 68 mm/hr — ABNORMAL HIGH (ref 0–30)

## 2018-10-27 LAB — C-REACTIVE PROTEIN: CRP: 26 mg/dL — ABNORMAL HIGH (ref <1.0)

## 2018-10-27 LAB — POTASSIUM: Potassium: 3.1 mmol/L — ABNORMAL LOW (ref 3.5–5.1)

## 2018-10-27 LAB — MAGNESIUM: Magnesium: 1.8 mg/dL (ref 1.7–2.4)

## 2018-10-27 MED ORDER — POTASSIUM CHLORIDE CRYS ER 20 MEQ PO TBCR
40.0000 meq | EXTENDED_RELEASE_TABLET | Freq: Once | ORAL | Status: AC
  Administered 2018-10-27: 40 meq via ORAL
  Filled 2018-10-27: qty 2

## 2018-10-27 MED ORDER — HYDROCORTISONE 1 % EX CREA
TOPICAL_CREAM | Freq: Two times a day (BID) | CUTANEOUS | Status: DC
  Administered 2018-10-27 – 2018-10-31 (×9): via TOPICAL
  Administered 2018-10-31: 1 via TOPICAL
  Administered 2018-11-01 – 2018-11-10 (×17): via TOPICAL
  Filled 2018-10-27: qty 28

## 2018-10-27 MED ORDER — TRAZODONE HCL 50 MG PO TABS
100.0000 mg | ORAL_TABLET | Freq: Every evening | ORAL | Status: DC | PRN
  Administered 2018-10-27 – 2018-11-10 (×16): 100 mg via ORAL
  Filled 2018-10-27 (×17): qty 2

## 2018-10-27 MED ORDER — POTASSIUM CHLORIDE 20 MEQ PO PACK
40.0000 meq | PACK | Freq: Three times a day (TID) | ORAL | Status: DC
  Administered 2018-10-27 (×2): 40 meq via ORAL
  Filled 2018-10-27 (×3): qty 2

## 2018-10-27 NOTE — Consult Note (Signed)
PHARMACY CONSULT NOTE - FOLLOW UP  Pharmacy Consult for Electrolyte Monitoring and Replacement   Recent Labs: Potassium (mmol/L)  Date Value  10/27/2018 3.1 (L)  06/02/2011 3.8   Magnesium (mg/dL)  Date Value  10/27/2018 1.8   Calcium (mg/dL)  Date Value  10/23/2018 7.9 (L)   Calcium, Total (mg/dL)  Date Value  06/02/2011 9.1   Albumin (g/dL)  Date Value  10/19/2018 1.8 (L)  11/27/2016 3.5  06/02/2011 3.8   Phosphorus (mg/dL)  Date Value  10/21/2018 3.6   Sodium (mmol/L)  Date Value  10/23/2018 135  11/27/2016 141  06/02/2011 140   Corrected Ca: 9.66 mg/dL  Assessment: Pharmacy consulted for electrolyte monitoring and replacement in 53 yo female admitted with sepsis secondary to MSSA bacteremia.   Goal of Therapy: Electrolytes WNL   Plan:   Patient received 40 mEq po KCl yesterday with no improvement in potassium level  Will give 40 mEq po KCl TID x 3  Will check potassium with morning labs.  Pharmacy will continue to monitor and replace electrolytes as needed.   Dallie Piles, PharmD Clinical Pharmacist 10/27/2018 7:19 AM

## 2018-10-27 NOTE — Progress Notes (Signed)
[place] at [place] NAME: [name]    MR#:  [id number]  DATE OF BIRTH:  1966-02-14  SUBJECTIVE:   The patient has back pain but no chest pain or abdominal pain. REVIEW OF SYSTEMS:    Review of Systems  Constitutional: Negative for chills and fever.  HENT: Negative for congestion and tinnitus.   Eyes: Negative for blurred vision and double vision.  Respiratory: Negative for cough, shortness of breath and wheezing.   Cardiovascular: Negative for chest pain, orthopnea and PND.  Gastrointestinal: Negative for abdominal pain, diarrhea, nausea and vomiting.  Genitourinary: Negative for dysuria and hematuria.  Musculoskeletal: Positive for back pain.  Skin: Negative for rash.  Neurological: Negative for dizziness, sensory change and focal weakness.  Psychiatric/Behavioral: Negative for depression. The patient is not nervous/anxious.   All other systems reviewed and are negative.   Nutrition: Regular Tolerating Diet: Yes Tolerating PT: Ambulatory   DRUG ALLERGIES:   Allergies  Allergen Reactions  . Topiramate Hives    VITALS:  Blood pressure (!) 152/91, pulse 100, temperature 98.5 F (36.9 C), resp. rate 18, height 5\' 4"  (1.626 m), weight 80.7 kg, last menstrual period 01/22/2017, SpO2 90 %.  PHYSICAL EXAMINATION:   Physical Exam  GENERAL:  53 y.o.-year-old patient lying in bed in no acute distress.  EYES: Pupils equal, round, reactive to light and accommodation. No scleral icterus. Extraocular muscles intact.  HEENT: Head atraumatic, normocephalic. Oropharynx and nasopharynx clear.  NECK:  Supple, no jugular venous distention. No thyroid enlargement, no tenderness.  LUNGS: Normal breath sounds bilaterally, no wheezing, rales, rhonchi. No use of accessory muscles of respiration.  CARDIOVASCULAR: S1, S2 normal. No murmurs, rubs, or gallops.  ABDOMEN: Soft, no tenderness or distention. Bowel sounds present. No organomegaly or  mass.  EXTREMITIES: No cyanosis, clubbing or edema b/l.    NEUROLOGIC: Cranial nerves II through XII are intact. No focal Motor or sensory deficits b/l. Globally weak.   PSYCHIATRIC: The patient is alert and oriented x 3.  SKIN: No obvious rash, lesion, or ulcer.  LABORATORY PANEL:   CBC Recent Labs  Lab 10/26/18 0633  WBC 14.2*  HGB 8.9*  HCT 27.9*  PLT 701*   ------------------------------------------------------------------------------------------------------------------  Chemistries  Recent Labs  Lab 10/23/18 0210  10/27/18 0506  NA 135  --   --   K 3.2*   < > 3.1*  CL 100  --   --   CO2 24  --   --   GLUCOSE 96  --   --   BUN 6  --   --   CREATININE 0.54  --   --   CALCIUM 7.9*  --   --   MG  --    < > 1.8   < > = values in this interval not displayed.   ------------------------------------------------------------------------------------------------------------------  Cardiac Enzymes No results for input(s): TROPONINI in the last 168 hours. ------------------------------------------------------------------------------------------------------------------  RADIOLOGY:  No results found.   ASSESSMENT AND PLAN:   53 year old female with past medical history of anxiety/depression, chronic back pain, osteoarthritis who presented to the hospital due to suspected sepsis.  1.  Sepsis- patient presented to the hospital with fever, tachycardia and leukocytosis. -Source of sepsis is disseminated staph infection from underlying paraspinous fluid collection from paraspinous abscess/discitis and also left knee septic arthritis.  -TEE was negative for endocarditis.  Continue IV nafcillin. - Discussed w/ ID and pt. Will likely need 4-6 weeks of IV abx.  Still leukocytosis, follow-up CBC.  2.  Discitis/paraspinous abscess- patient underwent a MRI of the thoracic spine which showed heterogeneous paraspinous abscess at the level of T9-T10. -Continue IV nafcillin.  - Status  post IR guided drainage of the paraspinous abscess.   Follow-up wound cultures: NO GROWTH 4 DAYS NO ANAEROBES ISOLATED; CULTURE IN PROGRESS FOR 5 DAYS .  3.  MSSA bacteremia-secondary to #2. -Continue IV nafcillin.  As mentioned TEE was negative for endocarditis.  Repeat cultures on 6/24 are currently negative.  ID is following. - will need IV abx for 4-6 weeks but given pt's hx of cocaine abuse ID does not want her to go home with PICC line and pt. Does not want to go to SNF.   4. Left knee septic Arthritis/left toe infection - s/p washout and I & D and currently stable.  - cont. abx as mentioned above with nafcillin.   5.  Anxiety/depression- continue Wellbutrin, Xanax.  6.  GERD- continue Protonix.  7.  Hypokalemia- improved with supplementation.   RUQ pain, abdominal ultrasound is unremarkable.  Improved. Hypokalemia.  Continue potassium supplement.  All the records are reviewed and case discussed with Care Management/Social Worker. Management plans discussed with the patient, family and they are in agreement.  CODE STATUS: Full code  DVT Prophylaxis: Ted's & SCD's.   TOTAL TIME TAKING CARE OF THIS PATIENT: 25 minutes.   POSSIBLE D/C 2-4 weeks, DEPENDING ON CLINICAL CONDITION and progress.   Demetrios Loll M.D on 10/27/2018 at 11:03 AM  Between 7am to 6pm - Pager - [phone]  After 6pm go to [email or web address] - password [email or web address] [facility]  Office  [phone]  CC: Primary care physician; Marguerita Merles, MD

## 2018-10-28 ENCOUNTER — Encounter: Payer: Self-pay | Admitting: Internal Medicine

## 2018-10-28 LAB — POTASSIUM: Potassium: 3.2 mmol/L — ABNORMAL LOW (ref 3.5–5.1)

## 2018-10-28 MED ORDER — POTASSIUM CHLORIDE 10 MEQ/100ML IV SOLN
10.0000 meq | INTRAVENOUS | Status: AC
  Administered 2018-10-28 (×2): 10 meq via INTRAVENOUS
  Filled 2018-10-28 (×2): qty 100

## 2018-10-28 MED ORDER — OXYCODONE-ACETAMINOPHEN 5-325 MG PO TABS
1.0000 | ORAL_TABLET | Freq: Four times a day (QID) | ORAL | Status: DC | PRN
  Administered 2018-10-28 – 2018-10-29 (×4): 1 via ORAL
  Filled 2018-10-28 (×4): qty 1

## 2018-10-28 MED ORDER — POLYETHYLENE GLYCOL 3350 17 G PO PACK
17.0000 g | PACK | Freq: Every day | ORAL | Status: DC
  Administered 2018-11-05 – 2018-11-07 (×3): 17 g via ORAL
  Filled 2018-10-28 (×6): qty 1

## 2018-10-28 NOTE — Progress Notes (Signed)
PT Cancellation Note  Patient Details Name: Jinger Middlesworth MRN: [id number] DOB: 1965-05-29   Cancelled Treatment:    Reason Eval/Treat Not Completed: Patient declined, no reason specified- patient reports she has been up, she took a shower by herself yesterday. Patient reports she does not need further PT as she is up and about in the room. Will complete PT order at this time.    Paisyn Guercio 10/28/2018, 12:29 PM

## 2018-10-28 NOTE — Progress Notes (Signed)
ID  Pt feeling better No right sided chest pain No fever No left knee pain  Patient Vitals for the past 24 hrs:  BP Temp Temp src Pulse Resp SpO2  10/28/18 2002 (!) 141/77 97.8 F (36.6 C) Oral 87 - 95 %  10/28/18 0517 (!) 141/82 97.9 F (36.6 C) Oral 84 18 91 %   Chest b/l air entry HS s1s2 Left knee- no swelling or erythema  CBC Latest Ref Rng & Units 10/26/2018 10/25/2018 10/23/2018  WBC 4.0 - 10.5 K/uL 14.2(H) 14.5(H) 13.9(H)  Hemoglobin 12.0 - 15.0 g/dL 8.9(L) 8.9(L) 8.7(L)  Hematocrit 36.0 - 46.0 % 27.9(L) 29.4(L) 27.7(L)  Platelets 150 - 400 K/uL 701(H) 718(H) 817(H)    CMP Latest Ref Rng & Units 10/28/2018 10/27/2018 10/26/2018  Glucose 70 - 99 mg/dL - - -  BUN 6 - 20 mg/dL - - -  Creatinine 0.44 - 1.00 mg/dL - - -  Sodium 135 - 145 mmol/L - - -  Potassium 3.5 - 5.1 mmol/L 3.2(L) 3.1(L) 3.2(L)  Chloride 98 - 111 mmol/L - - -  CO2 22 - 32 mmol/L - - -  Calcium 8.9 - 10.3 mg/dL - - -  Total Protein 6.5 - 8.1 g/dL - - -  Total Bilirubin 0.3 - 1.2 mg/dL - - -  Alkaline Phos 38 - 126 U/L - - -  AST 15 - 41 U/L - - -  ALT 0 - 44 U/L - - -   53 year old female with history of substance use presents with left knee pain, left toe pain and purplish discoloration and also lethargy. Also has been having back pain especially neck pain for which she had 2 prior ED visits on 6/10 and 10/04/2018.  New rt side-chest pain has resolved   pleuritic , started after the rt paraspinal drainage procedure - CXR showed infiltrate in the rt side but - no fever, no cough, no tachycardia, no sob, very low procalcitonin- hence pneumonia was unlikely- no antibiotic given   Disseminated staph aureus infection withStaph aureus bacteremia.In a patient with cocaineuse( She denies IVDA) left knee septic arthritis  left toe infection- s/p I/D .TEE neg for endocarditis .On nafcillin  Thoracic vertebral infection- Prominent paraspinous phlegmon and enhancement seen extending from T9  through T11, worse on the right. There is a heterogeneous paraspinous abscess at the level of T9-10 that measures 3.0 x 1.4 x 4.6 cm. Drain placement could not be done by IR because of movt. IR aspirated7 cc bloody fluid from right paraspinal fluid collection on 10/22/18. Fluid sent to microbiology for further evaluation.  Abnormal LFTs-HIV and hepatitis panel negative AST more than ALT.high alkaline phosphataseimproving-ultrasound GB no stone  Pt abuses cocaine , but denies IVDA/  There is a concern to send her home with PICC. She will get IV Nafcillin for 4 weeks ( 11/11/18) and then will switch to Po diclox Discussed the management withpatient

## 2018-10-28 NOTE — Progress Notes (Signed)
Patient ID: [name], female   DOB: [birth year], 53 y.o.   MRN: [id number]  [facility] PROGRESS NOTE  Petra Dumler WUJ:[id number] DOB: 04/08/1966 DOA: 10/13/2018 PCP: Marguerita Merles, MD  HPI/Subjective: Patient feeling better.  Wondering when she can go home.  She does not want to go out to a rehab.  She states her toe and knee are feeling better.  Having some abdominal pain.  Objective: Vitals:   10/27/18 1926 10/28/18 0517  BP: 139/84 (!) 141/82  Pulse: 87 84  Resp: 18 18  Temp: 98.6 F (37 C) 97.9 F (36.6 C)  SpO2: 93% 91%    Intake/Output Summary (Last 24 hours) at 10/28/2018 1413 Last data filed at 10/28/2018 0900 Gross per 24 hour  Intake 480 ml  Output -  Net 480 ml   Filed Weights   10/13/18 1416 10/16/18 1401 10/18/18 0828  Weight: 80.7 kg 80.7 kg 80.7 kg    ROS: Review of Systems  Constitutional: Negative for chills and fever.  Eyes: Negative for blurred vision.  Respiratory: Negative for cough and shortness of breath.   Cardiovascular: Negative for chest pain.  Gastrointestinal: Positive for abdominal pain. Negative for constipation, diarrhea, nausea and vomiting.  Genitourinary: Negative for dysuria.  Musculoskeletal: Negative for joint pain.  Neurological: Negative for dizziness and headaches.   Exam: Physical Exam  Constitutional: She is oriented to person, place, and time.  HENT:  Nose: No mucosal edema.  Mouth/Throat: No oropharyngeal exudate or posterior oropharyngeal edema.  Eyes: Pupils are equal, round, and reactive to light. Conjunctivae, EOM and lids are normal.  Neck: No JVD present. Carotid bruit is not present. No edema present. No thyroid mass and no thyromegaly present.  Cardiovascular: S1 normal and S2 normal. Exam reveals no gallop.  No murmur heard. Pulses:      Dorsalis pedis pulses are 2+ on the right side and 2+ on the left side.  Respiratory: No respiratory distress. She has no wheezes. She has no rhonchi. She has no rales.   GI: Soft. Bowel sounds are normal. There is no abdominal tenderness.  Musculoskeletal:     Right ankle: She exhibits no swelling.     Left ankle: She exhibits no swelling.  Lymphadenopathy:    She has no cervical adenopathy.  Neurological: She is alert and oriented to person, place, and time. No cranial nerve deficit.  Skin: Skin is warm. No rash noted. Nails show no clubbing.  Psychiatric: She has a normal mood and affect.      Data Reviewed: Basic Metabolic Panel: Recent Labs  Lab 10/23/18 0210 10/24/18 0348 10/25/18 9562 10/26/18 1308 10/27/18 0506 10/28/18 0644  NA 135  --   --   --   --   --   K 3.2* 3.2* 3.8 3.2* 3.1* 3.2*  CL 100  --   --   --   --   --   CO2 24  --   --   --   --   --   GLUCOSE 96  --   --   --   --   --   BUN 6  --   --   --   --   --   CREATININE 0.54  --   --   --   --   --   CALCIUM 7.9*  --   --   --   --   --   MG  --  1.7 1.8  --  1.8  --  CBC: Recent Labs  Lab 10/22/18 0407 10/23/18 0210 10/25/18 0628 10/26/18 0633  WBC 13.1* 13.9* 14.5* 14.2*  HGB 8.5* 8.7* 8.9* 8.9*  HCT 26.2* 27.7* 29.4* 27.9*  MCV 95.6 97.5 101.7* 96.9  PLT 743* 817* 718* 701*     Recent Results (from the past 240 hour(s))  Aerobic/Anaerobic Culture (surgical/deep wound)     Status: None   Collection Time: 10/22/18 12:35 PM   Specimen: Back; Abscess  Result Value Ref Range Status   Specimen Description   Final    BACK Performed at [facility], [address], [place], [place] [address]    Special Requests   Final    NONE Performed at [facility], [place], [place] [address]    Gram Stain NO WBC SEEN NO ORGANISMS SEEN   Final   Culture   Final    No growth aerobically or anaerobically. Performed at [facility], [place] [address] [place], [place] [address]    Report Status 10/27/2018 FINAL  Final  MRSA PCR Screening     Status: None   Collection Time: 10/24/18  4:17 PM   Specimen: Nasal Mucosa;  Nasopharyngeal  Result Value Ref Range Status   MRSA by PCR NEGATIVE NEGATIVE Final    Comment:        The GeneXpert MRSA Assay (FDA approved for NASAL specimens only), is one component of a comprehensive MRSA colonization surveillance program. It is not intended to diagnose MRSA infection nor to guide or monitor treatment for MRSA infections. Performed at [facility], [address] [place], [place] [address]       Scheduled Meds: . buPROPion  300 mg Oral Daily  . diazepam  1 mg Oral BID  . docusate sodium  100 mg Oral BID  . escitalopram  30 mg Oral Daily  . feeding supplement (ENSURE ENLIVE)  237 mL Oral BID BM  . fluticasone  1 spray Each Nare Daily  . hydrocortisone cream   Topical BID  . multivitamin with minerals  1 tablet Oral Daily  . pantoprazole  40 mg Oral BID  . polyethylene glycol  17 g Oral Daily  . sodium chloride flush  10-40 mL Intracatheter Q12H   Continuous Infusions: . sodium chloride 20 mL/hr at 10/24/18 2316  . sodium chloride Stopped (10/23/18 0208)  . Nafcillin 12g in sodium chloride 0.9% 521ml infusion 21 mL/hr at 10/27/18 1742    Assessment/Plan:  1. Staph aureus sepsis, paraspinal abscess, left second toe infection, left knee infection.  Patient on continuous nafcillin drip.  Infectious disease recommends 4 to 6 weeks of IV antibiotics.  Patient does not want to go to a facility.  Because of the patient's drug history nervous about sending the patient home. 2. Paraspinal abscess.  Patient had an IR drainage of the abscess but unable to keep a catheter in.  No growth so far out of that culture. 3. Left knee septic arthritis and left toe infection.  Patient had procedure on the toe and also a washout procedure on the left knee. 4. Anxiety depression on Wellbutrin and Xanax 5. GERD on Protonix. 6. Polysubstance abuse.  Discontinue Duragesic patch and place on Percocet.  Discontinue morphine.  Code Status:     Code Status Orders   (From admission, onward)         Start     Ordered   10/13/18 1636  Full code  Continuous     10/13/18 1636  Code Status History    This patient has a current code status but no historical code status.   Advance Care Planning Activity     Family Communication: Left message for the patient's aunt Disposition Plan: To be determined  Consultants:  Podiatry  Orthopedic surgery  Interventional radiology  Infectious disease  Antibiotics:  Nafcillin infusion  Time spent: 28 minutes  [place]

## 2018-10-28 NOTE — Consult Note (Signed)
PHARMACY CONSULT NOTE - FOLLOW UP  Pharmacy Consult for Electrolyte Monitoring and Replacement   Recent Labs: Potassium (mmol/L)  Date Value  10/28/2018 3.2 (L)  06/02/2011 3.8   Magnesium (mg/dL)  Date Value  10/27/2018 1.8   Calcium (mg/dL)  Date Value  10/23/2018 7.9 (L)   Calcium, Total (mg/dL)  Date Value  06/02/2011 9.1   Albumin (g/dL)  Date Value  10/19/2018 1.8 (L)  11/27/2016 3.5  06/02/2011 3.8   Phosphorus (mg/dL)  Date Value  10/21/2018 3.6   Sodium (mmol/L)  Date Value  10/23/2018 135  11/27/2016 141  06/02/2011 140   Corrected Ca: 9.66 mg/dL  Assessment: Pharmacy consulted for electrolyte monitoring and replacement in 53 yo female admitted with sepsis secondary to MSSA bacteremia.   Goal of Therapy: Electrolytes WNL   Plan:   Patient was ordered KCl 61mEq PO x 3 doses yesterday but refused 2 doses so only received 40 mEq po KCl with minimal improvement in potassium level  Will order KCl 73mEq IV x 2 doses in hopes of better compliance.  Will check potassium with morning labs.  Pharmacy will continue to monitor and replace electrolytes as needed.   Pearla Dubonnet, PharmD Clinical Pharmacist 10/28/2018 7:36 AM

## 2018-10-29 LAB — BASIC METABOLIC PANEL
Anion gap: 8 (ref 5–15)
BUN: <5 mg/dL — ABNORMAL LOW (ref 6–20)
CO2: 30 mmol/L (ref 22–32)
Calcium: 8.1 mg/dL — ABNORMAL LOW (ref 8.9–10.3)
Chloride: 101 mmol/L (ref 98–111)
Creatinine, Ser: 0.47 mg/dL (ref 0.44–1.00)
GFR calc Af Amer: >60 mL/min (ref >60)
GFR calc non Af Amer: >60 mL/min (ref >60)
Glucose, Bld: 118 mg/dL — ABNORMAL HIGH (ref 70–99)
Potassium: 3 mmol/L — ABNORMAL LOW (ref 3.5–5.1)
Sodium: 139 mmol/L (ref 135–145)

## 2018-10-29 LAB — CBC WITH DIFFERENTIAL/PLATELET
Abs Immature Granulocytes: 0.05 10*3/uL (ref 0.00–0.07)
Basophils Absolute: 0.1 10*3/uL (ref 0.0–0.1)
Basophils Relative: 1 %
Eosinophils Absolute: 0.3 10*3/uL (ref 0.0–0.5)
Eosinophils Relative: 3 %
HCT: 27.4 % — ABNORMAL LOW (ref 36.0–46.0)
Hemoglobin: 8.5 g/dL — ABNORMAL LOW (ref 12.0–15.0)
Immature Granulocytes: 0 %
Lymphocytes Relative: 14 %
Lymphs Abs: 1.5 10*3/uL (ref 0.7–4.0)
MCH: 30.1 pg (ref 26.0–34.0)
MCHC: 31 g/dL (ref 30.0–36.0)
MCV: 97.2 fL (ref 80.0–100.0)
Monocytes Absolute: 0.9 10*3/uL (ref 0.1–1.0)
Monocytes Relative: 8 %
Neutro Abs: 8.3 10*3/uL — ABNORMAL HIGH (ref 1.7–7.7)
Neutrophils Relative %: 74 %
Platelets: 644 10*3/uL — ABNORMAL HIGH (ref 150–400)
RBC: 2.82 MIL/uL — ABNORMAL LOW (ref 3.87–5.11)
RDW: 15.1 % (ref 11.5–15.5)
WBC: 11.2 10*3/uL — ABNORMAL HIGH (ref 4.0–10.5)
nRBC: 0 % (ref 0.0–0.2)

## 2018-10-29 LAB — PHOSPHORUS: Phosphorus: 3.9 mg/dL (ref 2.5–4.6)

## 2018-10-29 LAB — MAGNESIUM: Magnesium: 1.9 mg/dL (ref 1.7–2.4)

## 2018-10-29 MED ORDER — POTASSIUM CHLORIDE CRYS ER 20 MEQ PO TBCR
40.0000 meq | EXTENDED_RELEASE_TABLET | Freq: Two times a day (BID) | ORAL | Status: DC
  Administered 2018-10-29: 40 meq via ORAL
  Filled 2018-10-29: qty 2

## 2018-10-29 MED ORDER — POTASSIUM CHLORIDE 20 MEQ PO PACK: 40.0000 meq | PACK | Freq: Two times a day (BID) | ORAL | Status: DC

## 2018-10-29 MED ORDER — OXYCODONE-ACETAMINOPHEN 7.5-325 MG PO TABS
1.0000 | ORAL_TABLET | Freq: Four times a day (QID) | ORAL | Status: DC | PRN
  Administered 2018-10-29 – 2018-11-08 (×30): 1 via ORAL
  Filled 2018-10-29 (×33): qty 1

## 2018-10-29 MED ORDER — OXYCODONE HCL 5 MG PO TABS
5.0000 mg | ORAL_TABLET | Freq: Four times a day (QID) | ORAL | Status: DC | PRN
  Administered 2018-10-29 – 2018-11-08 (×23): 5 mg via ORAL
  Filled 2018-10-29 (×24): qty 1

## 2018-10-29 NOTE — Consult Note (Signed)
PHARMACY CONSULT NOTE - FOLLOW UP  Pharmacy Consult for Electrolyte Monitoring and Replacement   Recent Labs: Potassium (mmol/L)  Date Value  10/29/2018 3.0 (L)  06/02/2011 3.8   Magnesium (mg/dL)  Date Value  10/29/2018 1.9   Calcium (mg/dL)  Date Value  10/29/2018 8.1 (L)   Calcium, Total (mg/dL)  Date Value  06/02/2011 9.1   Albumin (g/dL)  Date Value  10/19/2018 1.8 (L)  11/27/2016 3.5  06/02/2011 3.8   Phosphorus (mg/dL)  Date Value  10/29/2018 3.9   Sodium (mmol/L)  Date Value  10/29/2018 139  11/27/2016 141  06/02/2011 140   Corrected Ca: 9.66 mg/dL  Assessment: Pharmacy consulted for electrolyte monitoring and replacement in 53 yo female admitted with sepsis secondary to MSSA bacteremia.   Goal of Therapy: Electrolytes WNL   Plan:   Physician has ordered KCl 27mEq PO BID scheduled. Will hold off on further supplementation at this time.  Will check potassium with morning labs.  Pharmacy will continue to monitor and replace electrolytes as needed.   Pearla Dubonnet, PharmD Clinical Pharmacist 10/29/2018 7:17 AM

## 2018-10-29 NOTE — Progress Notes (Signed)
Patient ID: [name], female   DOB: 1965/12/16, 53 y.o.   MRN: [id number]  [facility] PROGRESS NOTE  Naelle Diegel JOA:[id number] DOB: 02-02-1966 DOA: 10/13/2018 PCP: Marguerita Merles, MD  HPI/Subjective: Patient upset that she has to stay here for a complete course of IV antibiotics.  Patient still having some discomfort in her abdomen and knee and her back.  Objective: Vitals:   10/29/18 0447 10/29/18 0907  BP: (!) 142/83 (!) 137/96  Pulse: 83 90  Resp:  20  Temp: (!) 97.5 F (36.4 C) 98.6 F (37 C)  SpO2: 93% 97%    Intake/Output Summary (Last 24 hours) at 10/29/2018 1451 Last data filed at 10/29/2018 0921 Gross per 24 hour  Intake 3084.05 ml  Output -  Net 3084.05 ml   Filed Weights   10/13/18 1416 10/16/18 1401 10/18/18 0828  Weight: 80.7 kg 80.7 kg 80.7 kg    ROS: Review of Systems  Constitutional: Negative for chills and fever.  Eyes: Negative for blurred vision.  Respiratory: Negative for cough and shortness of breath.   Cardiovascular: Negative for chest pain.  Gastrointestinal: Positive for abdominal pain. Negative for constipation, diarrhea, nausea and vomiting.  Genitourinary: Negative for dysuria.  Musculoskeletal: Negative for joint pain.  Neurological: Negative for dizziness and headaches.   Exam: Physical Exam  Constitutional: She is oriented to person, place, and time.  HENT:  Nose: No mucosal edema.  Mouth/Throat: No oropharyngeal exudate or posterior oropharyngeal edema.  Eyes: Pupils are equal, round, and reactive to light. Conjunctivae, EOM and lids are normal.  Neck: No JVD present. Carotid bruit is not present. No edema present. No thyroid mass and no thyromegaly present.  Cardiovascular: S1 normal and S2 normal. Exam reveals no gallop.  No murmur heard. Pulses:      Dorsalis pedis pulses are 2+ on the right side and 2+ on the left side.  Respiratory: No respiratory distress. She has no wheezes. She has no rhonchi. She has no rales.  GI:  Soft. Bowel sounds are normal. There is no abdominal tenderness.  Musculoskeletal:     Right ankle: She exhibits no swelling.     Left ankle: She exhibits no swelling.  Lymphadenopathy:    She has no cervical adenopathy.  Neurological: She is alert and oriented to person, place, and time. No cranial nerve deficit.  Skin: Skin is warm. No rash noted. Nails show no clubbing.  Psychiatric: She has a normal mood and affect.      Data Reviewed: Basic Metabolic Panel: Recent Labs  Lab 10/23/18 0210 10/24/18 0348 10/25/18 6010 10/26/18 9323 10/27/18 0506 10/28/18 0644 10/29/18 0456  NA 135  --   --   --   --   --  139  K 3.2* 3.2* 3.8 3.2* 3.1* 3.2* 3.0*  CL 100  --   --   --   --   --  101  CO2 24  --   --   --   --   --  30  GLUCOSE 96  --   --   --   --   --  118*  BUN 6  --   --   --   --   --  <5*  CREATININE 0.54  --   --   --   --   --  0.47  CALCIUM 7.9*  --   --   --   --   --  8.1*  MG  --  1.7 1.8  --  1.8  --  1.9  PHOS  --   --   --   --   --   --  3.9   CBC: Recent Labs  Lab 10/23/18 0210 10/25/18 0628 10/26/18 0633 10/29/18 0456  WBC 13.9* 14.5* 14.2* 11.2*  NEUTROABS  --   --   --  8.3*  HGB 8.7* 8.9* 8.9* 8.5*  HCT 27.7* 29.4* 27.9* 27.4*  MCV 97.5 101.7* 96.9 97.2  PLT 817* 718* 701* 644*     Recent Results (from the past 240 hour(s))  Aerobic/Anaerobic Culture (surgical/deep wound)     Status: None   Collection Time: 10/22/18 12:35 PM   Specimen: Back; Abscess  Result Value Ref Range Status   Specimen Description   Final    BACK Performed at [facility], [address], [place], [place] [address]    Special Requests   Final    NONE Performed at [facility], [place], [place] [address]    Gram Stain NO WBC SEEN NO ORGANISMS SEEN   Final   Culture   Final    No growth aerobically or anaerobically. Performed at [facility], [place] [address] [place], [place] [address]    Report Status 10/27/2018  FINAL  Final  MRSA PCR Screening     Status: None   Collection Time: 10/24/18  4:17 PM   Specimen: Nasal Mucosa; Nasopharyngeal  Result Value Ref Range Status   MRSA by PCR NEGATIVE NEGATIVE Final    Comment:        The GeneXpert MRSA Assay (FDA approved for NASAL specimens only), is one component of a comprehensive MRSA colonization surveillance program. It is not intended to diagnose MRSA infection nor to guide or monitor treatment for MRSA infections. Performed at [facility], [address], [place], [place] [address]       Scheduled Meds: . buPROPion  300 mg Oral Daily  . diazepam  1 mg Oral BID  . docusate sodium  100 mg Oral BID  . escitalopram  30 mg Oral Daily  . feeding supplement (ENSURE ENLIVE)  237 mL Oral BID BM  . fluticasone  1 spray Each Nare Daily  . hydrocortisone cream   Topical BID  . multivitamin with minerals  1 tablet Oral Daily  . pantoprazole  40 mg Oral BID  . polyethylene glycol  17 g Oral Daily  . potassium chloride  40 mEq Oral BID  . sodium chloride flush  10-40 mL Intracatheter Q12H   Continuous Infusions: . sodium chloride 20 mL/hr at 10/28/18 2201  . sodium chloride Stopped (10/23/18 0208)  . Nafcillin 12g in sodium chloride 0.9% 548ml infusion 21 mL/hr at 10/28/18 1528    Assessment/Plan:  1. Staph aureus sepsis, paraspinal abscess, left second toe infection, left knee infection.  Patient on continuous nafcillin drip.  Infectious disease recommends 4  weeks of IV antibiotics through 7/20 and then another 2 weeks of oral antibiotics of dicloxacillin.  Patient does not want to go to a facility.  Because of the patient's drug history nervous about sending the patient home. 2. Paraspinal abscess.  Patient had an IR drainage of the abscess but unable to keep a catheter in.  No growth so far out of that culture. 3. Left knee septic arthritis and left toe infection.  Patient had procedure on the toe and also a washout procedure on  the left knee. 4. Anxiety depression on Wellbutrin and Xanax 5. GERD  on Protonix. 6. Polysubstance abuse.  Discontinue Duragesic patch and place on Percocet.  Discontinue morphine.  Code Status:     Code Status Orders  (From admission, onward)         Start     Ordered   10/13/18 1636  Full code  Continuous     10/13/18 1636        Code Status History    This patient has a current code status but no historical code status.   Advance Care Planning Activity     Family Communication: Spoke with the patient's aunt yesterday.  Spoke with the patient's sister today.  Patient sister is taking care of the patient's 2 young children. Disposition Plan: Potential discharge on 721.  Consultants:  Podiatry  Orthopedic surgery  Interventional radiology  Infectious disease  Antibiotics:  Nafcillin infusion  Time spent: 27 minutes  [place]

## 2018-10-29 NOTE — Progress Notes (Signed)
Cont to need pain meds  For c/o back pain 9/10. md increased pain meds some.

## 2018-10-29 NOTE — Plan of Care (Signed)
  Problem: Education: Goal: Knowledge of General Education information will improve Description: Including pain rating scale, medication(s)/side effects and non-pharmacologic comfort measures Outcome: Progressing   Problem: Pain Managment: Goal: General experience of comfort will improve Outcome: Progressing pain med required several times for c/o back pain

## 2018-10-30 LAB — BASIC METABOLIC PANEL
Anion gap: 7 (ref 5–15)
BUN: <5 mg/dL — ABNORMAL LOW (ref 6–20)
CO2: 30 mmol/L (ref 22–32)
Calcium: 8.2 mg/dL — ABNORMAL LOW (ref 8.9–10.3)
Chloride: 102 mmol/L (ref 98–111)
Creatinine, Ser: 0.43 mg/dL — ABNORMAL LOW (ref 0.44–1.00)
GFR calc Af Amer: >60 mL/min (ref >60)
GFR calc non Af Amer: >60 mL/min (ref >60)
Glucose, Bld: 94 mg/dL (ref 70–99)
Potassium: 2.8 mmol/L — ABNORMAL LOW (ref 3.5–5.1)
Sodium: 139 mmol/L (ref 135–145)

## 2018-10-30 LAB — POTASSIUM: Potassium: 3 mmol/L — ABNORMAL LOW (ref 3.5–5.1)

## 2018-10-30 LAB — MAGNESIUM: Magnesium: 1.8 mg/dL (ref 1.7–2.4)

## 2018-10-30 MED ORDER — POTASSIUM CHLORIDE CRYS ER 20 MEQ PO TBCR
40.0000 meq | EXTENDED_RELEASE_TABLET | Freq: Two times a day (BID) | ORAL | Status: DC
  Administered 2018-10-30 (×2): 40 meq via ORAL
  Filled 2018-10-30 (×2): qty 2

## 2018-10-30 MED ORDER — CYCLOBENZAPRINE HCL 10 MG PO TABS
5.0000 mg | ORAL_TABLET | Freq: Three times a day (TID) | ORAL | Status: DC | PRN
  Administered 2018-10-30 – 2018-11-11 (×23): 5 mg via ORAL
  Filled 2018-10-30 (×24): qty 1

## 2018-10-30 MED ORDER — MORPHINE SULFATE (PF) 2 MG/ML IV SOLN
2.0000 mg | INTRAVENOUS | Status: DC | PRN
  Administered 2018-10-30 – 2018-10-31 (×5): 2 mg via INTRAVENOUS
  Filled 2018-10-30 (×5): qty 1

## 2018-10-30 MED ORDER — POTASSIUM CHLORIDE 10 MEQ/100ML IV SOLN
10.0000 meq | INTRAVENOUS | Status: AC
  Administered 2018-10-30 (×3): 10 meq via INTRAVENOUS
  Filled 2018-10-30: qty 100

## 2018-10-30 NOTE — Progress Notes (Signed)
Patient has cried and  c/o 9/10 to 10/10 back pain most of the night I have given her all the medication that I can give her. Message paged Dr. Sidney Ace..new order for Morphine 2mg  IV q 4hr PRN.

## 2018-10-30 NOTE — Consult Note (Addendum)
PHARMACY CONSULT NOTE - FOLLOW UP  Pharmacy Consult for Electrolyte Monitoring and Replacement   Recent Labs: Potassium (mmol/L)  Date Value  10/30/2018 2.8 (L)  06/02/2011 3.8   Magnesium (mg/dL)  Date Value  10/29/2018 1.9   Calcium (mg/dL)  Date Value  10/30/2018 8.2 (L)   Calcium, Total (mg/dL)  Date Value  06/02/2011 9.1   Albumin (g/dL)  Date Value  10/19/2018 1.8 (L)  11/27/2016 3.5  06/02/2011 3.8   Phosphorus (mg/dL)  Date Value  10/29/2018 3.9   Sodium (mmol/L)  Date Value  10/30/2018 139  11/27/2016 141  06/02/2011 140   Corrected Ca: 9.66 mg/dL  Assessment: Pharmacy consulted for electrolyte monitoring and replacement in 53 yo female admitted with sepsis secondary to MSSA bacteremia.   Goal of Therapy: Electrolytes WNL   Plan:   Physician has ordered KCl 17mEq IV x 3 doses as well as KCl 59mEq PO BID scheduled. Will hold off on further supplementation at this time.  Will check potassium with morning labs.  Pharmacy will continue to monitor and replace electrolytes as needed.   Pearla Dubonnet, PharmD Clinical Pharmacist 10/30/2018 7:43 AM

## 2018-10-30 NOTE — Progress Notes (Signed)
C/o back and left shoulder pain. Started flexeril  For lft shoulder  Spasms. Morphine/ oxy and percocet given today.

## 2018-10-30 NOTE — Progress Notes (Signed)
[facility] [place] NAME: Tiarah Shisler    MR#:  [id number]  DATE OF BIRTH:  1966/04/20  SUBJECTIVE:  CHIEF COMPLAINT:   Chief Complaint  Patient presents with  . Altered Mental Status   Patient reported to have complained of some back pain last night and was given PRN morphine.  No back pain this morning.  Only complained of some muscle spasm.  No shortness of breath.  No fevers.  REVIEW OF SYSTEMS:  Review of Systems  Constitutional: Negative for chills, fever and weight loss.  HENT: Negative for hearing loss and tinnitus.   Eyes: Negative for blurred vision and double vision.  Respiratory: Negative for cough and hemoptysis.   Cardiovascular: Negative for chest pain and palpitations.  Gastrointestinal: Negative for heartburn, nausea and vomiting.  Genitourinary: Negative for dysuria and urgency.  Musculoskeletal:       Back pains last night.  Resolved. Muscle spasm  Skin: Negative for itching and rash.  Neurological: Negative for dizziness and headaches.  Psychiatric/Behavioral: Negative for depression and hallucinations.    DRUG ALLERGIES:   Allergies  Allergen Reactions  . Topiramate Hives   VITALS:  Blood pressure (!) 143/87, pulse 87, temperature 98.2 F (36.8 C), temperature source Oral, resp. rate 20, height 5\' 4"  (1.626 m), weight 80.7 kg, last menstrual period 01/22/2017, SpO2 95 %. PHYSICAL EXAMINATION:    Physical Exam  Constitutional: She is oriented to person, place, and time. She appears well-developed and well-nourished.  HENT:  Head: Normocephalic and atraumatic.  Right Ear: External ear normal.  Eyes: Pupils are equal, round, and reactive to light. Conjunctivae are normal. Right eye exhibits no discharge.  Neck: Normal range of motion. Neck supple. No thyromegaly present.  Cardiovascular: Normal rate, regular rhythm and normal heart sounds.  Respiratory: Effort normal and breath sounds normal. No  respiratory distress.  GI: Soft. Bowel sounds are normal. She exhibits no distension.  Musculoskeletal: Normal range of motion.        General: No edema.  Neurological: She is alert and oriented to person, place, and time. No cranial nerve deficit.  Skin: Skin is warm. She is not diaphoretic. No erythema.  Psychiatric: She has a normal mood and affect. Her behavior is normal.     LABORATORY PANEL:  Female CBC Recent Labs  Lab 10/29/18 0456  WBC 11.2*  HGB 8.5*  HCT 27.4*  PLT 644*   ------------------------------------------------------------------------------------------------------------------ Chemistries  Recent Labs  Lab 10/30/18 0512  NA 139  K 2.8*  CL 102  CO2 30  GLUCOSE 94  BUN <5*  CREATININE 0.43*  CALCIUM 8.2*  MG 1.8   RADIOLOGY:  No results found. ASSESSMENT AND PLAN:   1. Staph aureus sepsis, paraspinal abscess, left second toe infection, left knee infection.  Patient on continuous nafcillin infusion.  TEE negative for endocarditis. Infectious disease recommends 4  weeks of IV antibiotics through 7/20 and then another 2 weeks of oral antibiotics of dicloxacillin.  Patient does not want to go to a facility.  Because of the patient's drug history, unsafe to send patient home.  2. Paraspinal abscess.  Patient had an IR drainage of the abscess but unable to keep a catheter in.  No growth so far out of that culture.  3. Left knee septic arthritis and left toe infection.  Patient had procedure on the toe and also a washout procedure on the left knee.  Continue antibiotics as above.  4. Anxiety depression on  Wellbutrin and Xanax  5. GERD on Protonix.  6. Polysubstance abuse.  Discontinued Duragesic patch previously.  Patient noted to have been started on PRN morphine last night for pain control.  Denies any pain this morning.  Placed on PRN Flexeril 5 mg for muscle spasms.  7. Hypokalemia; being replaced.  Follow-up on repeat levels in a.m.  DVT prophylaxis;  placed on Lovenox  All the records are reviewed and case discussed with Care Management/Social Worker. Management plans discussed with the patient, and she is in agreement.  CODE STATUS: Full Code  TOTAL TIME TAKING CARE OF THIS PATIENT: 35 minutes.   More than 50% of the time was spent in counseling/coordination of care: YES  POSSIBLE D/C IN 12 DAYS, DEPENDING ON CLINICAL CONDITION.   Rashel Okeefe M.D on 10/30/2018 at 10:35 AM  Between 7am to 6pm - Pager - [phone]  After 6pm go to [email or web address] - Proofreader  [facility]  Office  [phone]  CC: Primary care physician; Marguerita Merles, MD  Note: This dictation was prepared with Dragon dictation along with smaller phrase technology. Any transcriptional errors that result from this process are unintentional.

## 2018-10-30 NOTE — Progress Notes (Signed)
Nutrition Follow-up  DOCUMENTATION CODES:   Not applicable  INTERVENTION:  Continue Ensure Enlive po BID, each supplement provides 350 kcal and 20 grams of protein.  Continue daily MVI.  NUTRITION DIAGNOSIS:   Inadequate oral intake related to decreased appetite as evidenced by per patient/family report.  Ongoing.  GOAL:   Patient will meet greater than or equal to 90% of their needs  Progressing.  MONITOR:   PO intake, Supplement acceptance, Labs, Weight trends, Skin, I & O's  REASON FOR ASSESSMENT:   LOS    ASSESSMENT:   53 year old female with PMHx of anxiety, depression, substance abuse, arthritis, back pain admitted with sepsis, discitis/paraspinous abscess s/p IR guided drainage, MSSA bacteremia, left knee septic arthritis s/p I&D.  Patient reports her appetite is still about the same. When she orders a full meal she can only finish about 30-50% of it, but if she orders soup or pizza she can finish most of the tray. She enjoys Ensure and is drinking them per her report.  Medications reviewed and include: Colace 100 mg BID, MVI daily, pantoprazole, Miralax 17 grams daily, potassium chloride 40 mEq BID, NS at 20 mL/hr, Nafcillin.  Labs reviewed: Potassium 2.8, BUN <5, Creatinine 0.43.  Diet Order:   Diet Order            Diet regular Room service appropriate? Yes; Fluid consistency: Thin  Diet effective now             EDUCATION NEEDS:   Education needs have been addressed  Skin:  Skin Assessment: Skin Integrity Issues:(closed incision left knee, closed incision left toe)  Last BM:  10/30/2018 per chart  Height:   Ht Readings from Last 1 Encounters:  10/18/18 5\' 4"  (1.626 m)   Weight:   Wt Readings from Last 1 Encounters:  10/18/18 80.7 kg   Ideal Body Weight:  54.5 kg  BMI:  Body mass index is 30.54 kg/m.  Estimated Nutritional Needs:   Kcal:  1800-2000  Protein:  90-100 grams  Fluid:  1.8-2 L/day  Willey Blade, MS, RD,  LDN Office: [phone] Pager: [phone] After Hours/Weekend Pager: [phone]

## 2018-10-31 LAB — CBC
HCT: 27.5 % — ABNORMAL LOW (ref 36.0–46.0)
Hemoglobin: 8.5 g/dL — ABNORMAL LOW (ref 12.0–15.0)
MCH: 29.8 pg (ref 26.0–34.0)
MCHC: 30.9 g/dL (ref 30.0–36.0)
MCV: 96.5 fL (ref 80.0–100.0)
Platelets: 663 10*3/uL — ABNORMAL HIGH (ref 150–400)
RBC: 2.85 MIL/uL — ABNORMAL LOW (ref 3.87–5.11)
RDW: 14.9 % (ref 11.5–15.5)
WBC: 9.5 10*3/uL (ref 4.0–10.5)
nRBC: 0 % (ref 0.0–0.2)

## 2018-10-31 LAB — BASIC METABOLIC PANEL
Anion gap: 9 (ref 5–15)
BUN: <5 mg/dL — ABNORMAL LOW (ref 6–20)
CO2: 29 mmol/L (ref 22–32)
Calcium: 8.5 mg/dL — ABNORMAL LOW (ref 8.9–10.3)
Chloride: 102 mmol/L (ref 98–111)
Creatinine, Ser: 0.62 mg/dL (ref 0.44–1.00)
GFR calc Af Amer: >60 mL/min (ref >60)
GFR calc non Af Amer: >60 mL/min (ref >60)
Glucose, Bld: 98 mg/dL (ref 70–99)
Potassium: 3.1 mmol/L — ABNORMAL LOW (ref 3.5–5.1)
Sodium: 140 mmol/L (ref 135–145)

## 2018-10-31 LAB — MAGNESIUM: Magnesium: 1.8 mg/dL (ref 1.7–2.4)

## 2018-10-31 MED ORDER — POTASSIUM CHLORIDE CRYS ER 20 MEQ PO TBCR
40.0000 meq | EXTENDED_RELEASE_TABLET | Freq: Three times a day (TID) | ORAL | Status: DC
  Administered 2018-10-31 – 2018-11-02 (×9): 40 meq via ORAL
  Filled 2018-10-31 (×9): qty 2

## 2018-10-31 NOTE — Progress Notes (Signed)
[facility] [place] NAME: [name]    MR#:  [id number]  DATE OF BIRTH:  [birth year]  SUBJECTIVE:  CHIEF COMPLAINT:   Chief Complaint  Patient presents with  . Altered Mental Status   No new complaint this morning.  No fevers.  No shortness of breath.  REVIEW OF SYSTEMS:  Review of Systems  Constitutional: Negative for chills, fever and weight loss.  HENT: Negative for hearing loss and tinnitus.   Eyes: Negative for blurred vision and double vision.  Respiratory: Negative for cough and hemoptysis.   Cardiovascular: Negative for chest pain and palpitations.  Gastrointestinal: Negative for heartburn, nausea and vomiting.  Genitourinary: Negative for dysuria and urgency.  Musculoskeletal:       Back pains last night.  Resolved. Muscle spasm  Skin: Negative for itching and rash.  Neurological: Negative for dizziness and headaches.  Psychiatric/Behavioral: Negative for depression and hallucinations.    DRUG ALLERGIES:   Allergies  Allergen Reactions  . Topiramate Hives   VITALS:  Blood pressure 140/78, pulse 84, temperature 98.4 F (36.9 C), temperature source Oral, resp. rate 18, height 5\' 4"  (1.626 m), weight 80.7 kg, last menstrual period 01/22/2017, SpO2 91 %. PHYSICAL EXAMINATION:    Physical Exam  Constitutional: She is oriented to person, place, and time. She appears well-developed and well-nourished.  HENT:  Head: Normocephalic and atraumatic.  Right Ear: External ear normal.  Eyes: Pupils are equal, round, and reactive to light. Conjunctivae are normal. Right eye exhibits no discharge.  Neck: Normal range of motion. Neck supple. No thyromegaly present.  Cardiovascular: Normal rate, regular rhythm and normal heart sounds.  Respiratory: Effort normal and breath sounds normal. No respiratory distress.  GI: Soft. Bowel sounds are normal. She exhibits no distension.  Musculoskeletal: Normal range of motion.      General: No edema.  Neurological: She is alert and oriented to person, place, and time. No cranial nerve deficit.  Skin: Skin is warm. She is not diaphoretic. No erythema.  Psychiatric: She has a normal mood and affect. Her behavior is normal.     LABORATORY PANEL:  Female CBC Recent Labs  Lab 10/31/18 0424  WBC 9.5  HGB 8.5*  HCT 27.5*  PLT 663*   ------------------------------------------------------------------------------------------------------------------ Chemistries  Recent Labs  Lab 10/31/18 0424  NA 140  K 3.1*  CL 102  CO2 29  GLUCOSE 98  BUN <5*  CREATININE 0.62  CALCIUM 8.5*  MG 1.8   RADIOLOGY:  No results found. ASSESSMENT AND PLAN:   1. Staph aureus sepsis, paraspinal abscess, left second toe infection, left knee infection.  Patient on continuous nafcillin infusion.  TEE negative for endocarditis. Infectious disease recommends 4  weeks of IV antibiotics through 7/20 and then another 2 weeks of oral antibiotics of dicloxacillin.  Patient does not want to go to a facility.  Because of the patient's drug history, unsafe to send patient home.  2. Paraspinal abscess.  Patient had an IR drainage of the abscess but unable to keep a catheter in.  No growth so far out of that culture.  3. Left knee septic arthritis and left toe infection.  Patient had procedure on the toe and also a washout procedure on the left knee.  Continue antibiotics as above.  4. Anxiety depression on Wellbutrin and Xanax  5. GERD on Protonix.  6. Polysubstance abuse.  Discontinued Duragesic patch previously.  Trying to wean off narcotics.  Discontinued morphine this morning.  Denies any pain this morning.  Placed on PRN Flexeril 5 mg for muscle spasms.   7. Hypokalemia; being replaced.  Follow-up on repeat levels in a.m.  DVT prophylaxis; placed on Lovenox  All the records are reviewed and case discussed with Care Management/Social Worker. Management plans discussed with the patient,  and she is in agreement.  CODE STATUS: Full Code  TOTAL TIME TAKING CARE OF THIS PATIENT: 34 minutes.   More than 50% of the time was spent in counseling/coordination of care: YES  POSSIBLE D/C IN 11 DAYS, DEPENDING ON CLINICAL CONDITION.   [name] M.D on 10/31/2018 at 12:17 PM  Between 7am to 6pm - Pager - [phone]  After 6pm go to [email or web address] - Proofreader  [facility]  Office  [phone]  CC: Primary care physician; Marguerita Merles, MD  Note: This dictation was prepared with Dragon dictation along with smaller phrase technology. Any transcriptional errors that result from this process are unintentional.

## 2018-10-31 NOTE — Plan of Care (Signed)
  Problem: Education: Goal: Knowledge of General Education information will improve Description: Including pain rating scale, medication(s)/side effects and non-pharmacologic comfort measures Outcome: Progressing   Problem: Clinical Measurements: Goal: Ability to maintain clinical measurements within normal limits will improve Outcome: Progressing   Problem: Pain Managment: Goal: General experience of comfort will improve Outcome: Progressing   Problem: Safety: Goal: Ability to remain free from injury will improve Outcome: Progressing   Problem: Skin Integrity: Goal: Risk for impaired skin integrity will decrease Outcome: Progressing   Problem: Clinical Measurements: Goal: Signs and symptoms of infection will decrease Outcome: Progressing

## 2018-10-31 NOTE — Consult Note (Signed)
PHARMACY CONSULT NOTE - FOLLOW UP  Pharmacy Consult for Electrolyte Monitoring and Replacement   Recent Labs: Potassium (mmol/L)  Date Value  10/31/2018 3.1 (L)  06/02/2011 3.8   Magnesium (mg/dL)  Date Value  10/31/2018 1.8   Calcium (mg/dL)  Date Value  10/31/2018 8.5 (L)   Calcium, Total (mg/dL)  Date Value  06/02/2011 9.1   Albumin (g/dL)  Date Value  10/19/2018 1.8 (L)  11/27/2016 3.5  06/02/2011 3.8   Phosphorus (mg/dL)  Date Value  10/29/2018 3.9   Sodium (mmol/L)  Date Value  10/31/2018 140  11/27/2016 141  06/02/2011 140   Corrected Ca: 9.66 mg/dL  Assessment: Pharmacy consulted for electrolyte monitoring and replacement in 53 yo female admitted with sepsis secondary to MSSA bacteremia.   Goal of Therapy: Electrolytes WNL   Plan:   Patient is currently ordered KCl 16mEq PO BID scheduled. Will increase to KCl 46mEq PO TID.  Will check potassium with morning labs.  Pharmacy will continue to monitor and replace electrolytes as needed.   Paulina Fusi, PharmD, BCPS 10/31/2018 9:27 AM

## 2018-11-01 LAB — BASIC METABOLIC PANEL
Anion gap: 11 (ref 5–15)
BUN: 5 mg/dL — ABNORMAL LOW (ref 6–20)
CO2: 27 mmol/L (ref 22–32)
Calcium: 8.5 mg/dL — ABNORMAL LOW (ref 8.9–10.3)
Chloride: 102 mmol/L (ref 98–111)
Creatinine, Ser: 0.5 mg/dL (ref 0.44–1.00)
GFR calc Af Amer: >60 mL/min (ref >60)
GFR calc non Af Amer: >60 mL/min (ref >60)
Glucose, Bld: 92 mg/dL (ref 70–99)
Potassium: 3.2 mmol/L — ABNORMAL LOW (ref 3.5–5.1)
Sodium: 140 mmol/L (ref 135–145)

## 2018-11-01 LAB — MAGNESIUM: Magnesium: 1.9 mg/dL (ref 1.7–2.4)

## 2018-11-01 MED ORDER — POTASSIUM CHLORIDE CRYS ER 20 MEQ PO TBCR
40.0000 meq | EXTENDED_RELEASE_TABLET | Freq: Once | ORAL | Status: AC
  Administered 2018-11-01: 40 meq via ORAL
  Filled 2018-11-01: qty 2

## 2018-11-01 NOTE — Progress Notes (Signed)
[facility] [place] NAME: Vadie Principato    MR#:  [id number]  DATE OF BIRTH:  1966/02/16  SUBJECTIVE:  CHIEF COMPLAINT:   Chief Complaint  Patient presents with  . Altered Mental Status   No new complaint this morning.  No fevers.  No shortness of breath.  Resting comfortably  REVIEW OF SYSTEMS:  Review of Systems  Constitutional: Negative for chills, fever and weight loss.  HENT: Negative for hearing loss and tinnitus.   Eyes: Negative for blurred vision and double vision.  Respiratory: Negative for cough and hemoptysis.   Cardiovascular: Negative for chest pain and palpitations.  Gastrointestinal: Negative for heartburn, nausea and vomiting.  Genitourinary: Negative for dysuria and urgency.  Musculoskeletal:       Back pains last night.  Resolved. Muscle spasm  Skin: Negative for itching and rash.  Neurological: Negative for dizziness and headaches.  Psychiatric/Behavioral: Negative for depression and hallucinations.    DRUG ALLERGIES:   Allergies  Allergen Reactions  . Topiramate Hives   VITALS:  Blood pressure (!) 156/110, pulse 85, temperature 98.2 F (36.8 C), temperature source Oral, resp. rate 20, height 5\' 4"  (1.626 m), weight 80.7 kg, last menstrual period 01/22/2017, SpO2 96 %. PHYSICAL EXAMINATION:    Physical Exam  Constitutional: She is oriented to person, place, and time. She appears well-developed and well-nourished.  HENT:  Head: Normocephalic and atraumatic.  Right Ear: External ear normal.  Eyes: Pupils are equal, round, and reactive to light. Conjunctivae are normal. Right eye exhibits no discharge.  Neck: Normal range of motion. Neck supple. No thyromegaly present.  Cardiovascular: Normal rate, regular rhythm and normal heart sounds.  Respiratory: Effort normal and breath sounds normal. No respiratory distress.  GI: Soft. Bowel sounds are normal. She exhibits no distension.  Musculoskeletal: Normal  range of motion.        General: No edema.  Neurological: She is alert and oriented to person, place, and time. No cranial nerve deficit.  Skin: Skin is warm. She is not diaphoretic. No erythema.  Psychiatric: She has a normal mood and affect. Her behavior is normal.     LABORATORY PANEL:  Female CBC Recent Labs  Lab 10/31/18 0424  WBC 9.5  HGB 8.5*  HCT 27.5*  PLT 663*   ------------------------------------------------------------------------------------------------------------------ Chemistries  Recent Labs  Lab 11/01/18 0612  NA 140  K 3.2*  CL 102  CO2 27  GLUCOSE 92  BUN 5*  CREATININE 0.50  CALCIUM 8.5*  MG 1.9   RADIOLOGY:  No results found. ASSESSMENT AND PLAN:   1. Staph aureus sepsis, paraspinal abscess, left second toe infection, left knee infection.  Patient on continuous nafcillin infusion.  TEE negative for endocarditis. Infectious disease recommends 4  weeks of IV antibiotics through 7/20 and then another 2 weeks of oral antibiotics of dicloxacillin.  Patient does not want to go to a facility.  Because of the patient's drug history, unsafe to send patient home.  2. Paraspinal abscess.  Patient had an IR drainage of the abscess but unable to keep a catheter in.  No growth so far out of that culture.  3. Left knee septic arthritis and left toe infection.  Patient had procedure on the toe and also a washout procedure on the left knee.  Continue antibiotics as above.  4. Anxiety depression on Wellbutrin and Xanax  5. GERD on Protonix.  6. Polysubstance abuse.  Discontinued Duragesic patch previously.  Trying to wean  off narcotics.  Discontinued morphine previously .  Denies any pain this morning.  Placed on PRN Flexeril 5 mg for muscle spasms.   7. Hypokalemia; being replaced.  Follow-up on repeat levels in a.m.  DVT prophylaxis; placed on Lovenox  All the records are reviewed and case discussed with Care Management/Social Worker. Management plans  discussed with the patient, and she is in agreement.  CODE STATUS: Full Code  TOTAL TIME TAKING CARE OF THIS PATIENT: 33 minutes.   More than 50% of the time was spent in counseling/coordination of care: YES  POSSIBLE D/C IN 10 DAYS, DEPENDING ON CLINICAL CONDITION.   Ruthanna Macchia M.D on 11/01/2018 at 12:45 PM  Between 7am to 6pm - Pager - [phone]  After 6pm go to [email or web address] - Proofreader  [facility]  Office  [phone]  CC: Primary care physician; Marguerita Merles, MD  Note: This dictation was prepared with Dragon dictation along with smaller phrase technology. Any transcriptional errors that result from this process are unintentional.

## 2018-11-01 NOTE — Consult Note (Signed)
PHARMACY CONSULT NOTE - FOLLOW UP  Pharmacy Consult for Electrolyte Monitoring and Replacement   Recent Labs: Potassium (mmol/L)  Date Value  11/01/2018 3.2 (L)  06/02/2011 3.8   Magnesium (mg/dL)  Date Value  11/01/2018 1.9   Calcium (mg/dL)  Date Value  11/01/2018 8.5 (L)   Calcium, Total (mg/dL)  Date Value  06/02/2011 9.1   Albumin (g/dL)  Date Value  10/19/2018 1.8 (L)  11/27/2016 3.5  06/02/2011 3.8   Phosphorus (mg/dL)  Date Value  10/29/2018 3.9   Sodium (mmol/L)  Date Value  11/01/2018 140  11/27/2016 141  06/02/2011 140   Corrected Ca: 9.66 mg/dL  Assessment: Pharmacy consulted for electrolyte monitoring and replacement in 53 yo female admitted with sepsis secondary to MSSA bacteremia. Currently being treated with continuous infusion Nafcillin which could be the cause of the hypokalemia.  Goal of Therapy: Electrolytes WNL   Plan:   Patient is currently ordered KCl 71mEq PO TID scheduled. Will give an extra KCl 41mEq PO today.  Will check potassium with morning labs.  Pharmacy will continue to monitor and replace electrolytes as needed.   Paulina Fusi, PharmD, BCPS 11/01/2018 9:41 AM

## 2018-11-01 NOTE — Plan of Care (Signed)
  Problem: Education: Goal: Knowledge of General Education information will improve Description Including pain rating scale, medication(s)/side effects and non-pharmacologic comfort measures Outcome: Progressing   

## 2018-11-02 LAB — BASIC METABOLIC PANEL
Anion gap: 10 (ref 5–15)
BUN: <5 mg/dL — ABNORMAL LOW (ref 6–20)
CO2: 25 mmol/L (ref 22–32)
Calcium: 8.4 mg/dL — ABNORMAL LOW (ref 8.9–10.3)
Chloride: 105 mmol/L (ref 98–111)
Creatinine, Ser: 0.57 mg/dL (ref 0.44–1.00)
GFR calc Af Amer: >60 mL/min (ref >60)
GFR calc non Af Amer: >60 mL/min (ref >60)
Glucose, Bld: 122 mg/dL — ABNORMAL HIGH (ref 70–99)
Potassium: 4 mmol/L (ref 3.5–5.1)
Sodium: 140 mmol/L (ref 135–145)

## 2018-11-02 LAB — MAGNESIUM: Magnesium: 1.9 mg/dL (ref 1.7–2.4)

## 2018-11-02 NOTE — Progress Notes (Signed)
[name]: [name]    MR#:  [id number]  DATE OF BIRTH:  [birth year]  SUBJECTIVE:  CHIEF COMPLAINT:   Chief Complaint  Patient presents with  . Altered Mental Status   In bed today.  Comfortable.  No complaints.  Wants to go home.  REVIEW OF SYSTEMS:  Review of Systems  Constitutional: Negative for chills, fever and weight loss.  HENT: Negative for hearing loss and tinnitus.   Eyes: Negative for blurred vision and double vision.  Respiratory: Negative for cough and hemoptysis.   Cardiovascular: Negative for chest pain and palpitations.  Gastrointestinal: Negative for heartburn, nausea and vomiting.  Genitourinary: Negative for dysuria and urgency.  Skin: Negative for itching and rash.  Neurological: Negative for dizziness and headaches.  Psychiatric/Behavioral: Negative for depression and hallucinations.    DRUG ALLERGIES:   Allergies  Allergen Reactions  . Topiramate Hives   VITALS:  Blood pressure 123/80, pulse 92, temperature 98 F (36.7 C), resp. rate 20, height 5\' 4"  (1.626 m), weight 80.7 kg, last menstrual period 01/22/2017, SpO2 94 %. PHYSICAL EXAMINATION:    Physical Exam  Constitutional: She is oriented to person, place, and time. She appears well-developed and well-nourished.  HENT:  Head: Normocephalic and atraumatic.  Right Ear: External ear normal.  Eyes: Pupils are equal, round, and reactive to light. Conjunctivae are normal. Right eye exhibits no discharge.  Neck: Normal range of motion. Neck supple. No thyromegaly present.  Cardiovascular: Normal rate, regular rhythm and normal heart sounds.  Respiratory: Effort normal and breath sounds normal. No respiratory distress.  GI: Soft. Bowel sounds are normal. She exhibits no distension.  Musculoskeletal: Normal range of motion.        General: No edema.  Neurological: She is alert and oriented to person, place, and time. No cranial nerve  deficit.  Skin: Skin is warm. She is not diaphoretic. No erythema.  Psychiatric: She has a normal mood and affect. Her behavior is normal.     LABORATORY PANEL:  Female CBC Recent Labs  Lab 10/31/18 0424  WBC 9.5  HGB 8.5*  HCT 27.5*  PLT 663*   ------------------------------------------------------------------------------------------------------------------ Chemistries  Recent Labs  Lab 11/02/18 0443  NA 140  K 4.0  CL 105  CO2 25  GLUCOSE 122*  BUN <5*  CREATININE 0.57  CALCIUM 8.4*  MG 1.9   RADIOLOGY:  No results found. ASSESSMENT AND PLAN:   1. Staph aureus bacteremia, paraspinal abscess, left second toe infection, left knee infection.  Patient on continuous nafcillin infusion.  TEE negative for endocarditis. Infectious disease recommends 4  weeks of IV antibiotics through 7/20 and then another 2 weeks of oral antibiotics of dicloxacillin.  Patient does not want to go to a facility.  Because of the patient's drug history, unsafe to send patient home.  Has a PICC line  2. Paraspinal abscess.  Patient had an IR drainage of the abscess but unable to keep a catheter in.  No growth so far out of that culture.  Was on antibiotics prior to drainage of fluid  3. Left knee septic arthritis and left toe infection.  Patient had procedure on the toe and also a washout procedure on the left knee.  Continue antibiotics as above.  4. Anxiety depression on Wellbutrin and Xanax  5. GERD on Protonix.  6. Polysubstance abuse.    Try to avoid narcotics.  Placed on PRN Flexeril 5 mg for muscle spasms.  7. Hypokalemia-replace  8.  Anemia of chronic disease is stable  DVT prophylaxis -on Lovenox  All the records are reviewed and case discussed with Care Management/Social Worker. Management plans discussed with the patient, and she is in agreement.  CODE STATUS: Full Code  TOTAL TIME TAKING CARE OF THIS PATIENT: 25 minutes.   POSSIBLE D/C IN 10 DAYS, DEPENDING ON CLINICAL  CONDITION.   Neita Carp M.D on 11/02/2018 at 12:42 PM  Between 7am to 6pm - Pager - [phone]  After 6pm go to [email or web address] - Proofreader  [facility]  Office  [phone]  CC: Primary care physician; Marguerita Merles, MD  Note: This dictation was prepared with Dragon dictation along with smaller phrase technology. Any transcriptional errors that result from this process are unintentional.

## 2018-11-02 NOTE — Plan of Care (Signed)
  Problem: Clinical Measurements: Goal: Ability to maintain clinical measurements within normal limits will improve Outcome: Progressing   Problem: Safety: Goal: Ability to remain free from injury will improve Outcome: Progressing   Problem: Clinical Measurements: Goal: Signs and symptoms of infection will decrease Outcome: Progressing   Problem: Pain Managment: Goal: General experience of comfort will improve Outcome: Not Progressing  Still asks for PRN pain medications; 9 on 0-10 scale

## 2018-11-02 NOTE — Consult Note (Signed)
PHARMACY CONSULT NOTE - FOLLOW UP  Pharmacy Consult for Electrolyte Monitoring and Replacement   Recent Labs: Potassium (mmol/L)  Date Value  11/02/2018 4.0  06/02/2011 3.8   Magnesium (mg/dL)  Date Value  11/02/2018 1.9   Calcium (mg/dL)  Date Value  11/02/2018 8.4 (L)   Calcium, Total (mg/dL)  Date Value  06/02/2011 9.1   Albumin (g/dL)  Date Value  10/19/2018 1.8 (L)  11/27/2016 3.5  06/02/2011 3.8   Phosphorus (mg/dL)  Date Value  10/29/2018 3.9   Sodium (mmol/L)  Date Value  11/02/2018 140  11/27/2016 141  06/02/2011 140   Corrected Ca: 9.66 mg/dL  Assessment: Pharmacy consulted for electrolyte monitoring and replacement in 53 yo female admitted with sepsis secondary to MSSA bacteremia. Currently being treated with continuous infusion Nafcillin which could be the cause of the hypokalemia.  Goal of Therapy: Electrolytes WNL   Plan:   Electrolytes WNL this morning.   No replacement warranted at this time.   Will recheck electrolytes with morning labs.  Pharmacy will continue to monitor and replace electrolytes as needed.   Pernell Dupre, PharmD, BCPS Clinical Pharmacist 11/02/2018 6:01 AM

## 2018-11-03 LAB — BASIC METABOLIC PANEL
Anion gap: 7 (ref 5–15)
BUN: 8 mg/dL (ref 6–20)
CO2: 28 mmol/L (ref 22–32)
Calcium: 8.9 mg/dL (ref 8.9–10.3)
Chloride: 105 mmol/L (ref 98–111)
Creatinine, Ser: 0.45 mg/dL (ref 0.44–1.00)
GFR calc Af Amer: >60 mL/min (ref >60)
GFR calc non Af Amer: >60 mL/min (ref >60)
Glucose, Bld: 106 mg/dL — ABNORMAL HIGH (ref 70–99)
Potassium: 4.4 mmol/L (ref 3.5–5.1)
Sodium: 140 mmol/L (ref 135–145)

## 2018-11-03 LAB — MAGNESIUM: Magnesium: 2 mg/dL (ref 1.7–2.4)

## 2018-11-03 MED ORDER — POTASSIUM CHLORIDE CRYS ER 20 MEQ PO TBCR
30.0000 meq | EXTENDED_RELEASE_TABLET | Freq: Two times a day (BID) | ORAL | Status: DC
  Administered 2018-11-04 – 2018-11-07 (×8): 30 meq via ORAL
  Filled 2018-11-03 (×8): qty 1

## 2018-11-03 NOTE — Progress Notes (Signed)
[facility] at [place] NAME: [name]    MR#:  [id number]  DATE OF BIRTH:  Jun 06, 1965  SUBJECTIVE:  CHIEF COMPLAINT:   Chief Complaint  Patient presents with  . Altered Mental Status   Mild back pain.  Afebrile.  REVIEW OF SYSTEMS:  Review of Systems  Constitutional: Negative for chills, fever and weight loss.  HENT: Negative for hearing loss and tinnitus.   Eyes: Negative for blurred vision and double vision.  Respiratory: Negative for cough and hemoptysis.   Cardiovascular: Negative for chest pain and palpitations.  Gastrointestinal: Negative for heartburn, nausea and vomiting.  Genitourinary: Negative for dysuria and urgency.  Skin: Negative for itching and rash.  Neurological: Negative for dizziness and headaches.  Psychiatric/Behavioral: Negative for depression and hallucinations.    DRUG ALLERGIES:   Allergies  Allergen Reactions  . Topiramate Hives   VITALS:  Blood pressure (!) 151/97, pulse 90, temperature 98.2 F (36.8 C), temperature source Oral, resp. rate 20, height 5\' 4"  (1.626 m), weight 80.7 kg, last menstrual period 01/22/2017, SpO2 93 %. PHYSICAL EXAMINATION:    Physical Exam  Constitutional: She is oriented to person, place, and time. She appears well-developed and well-nourished.  HENT:  Head: Normocephalic and atraumatic.  Right Ear: External ear normal.  Eyes: Pupils are equal, round, and reactive to light. Conjunctivae are normal. Right eye exhibits no discharge.  Neck: Normal range of motion. Neck supple. No thyromegaly present.  Cardiovascular: Normal rate, regular rhythm and normal heart sounds.  Respiratory: Effort normal and breath sounds normal. No respiratory distress.  GI: Soft. Bowel sounds are normal. She exhibits no distension.  Musculoskeletal: Normal range of motion.        General: No edema.  Neurological: She is alert and oriented to person, place, and time. No cranial nerve deficit.   Skin: Skin is warm. She is not diaphoretic. No erythema.  Psychiatric: She has a normal mood and affect. Her behavior is normal.     LABORATORY PANEL:  Female CBC Recent Labs  Lab 10/31/18 0424  WBC 9.5  HGB 8.5*  HCT 27.5*  PLT 663*   ------------------------------------------------------------------------------------------------------------------ Chemistries  Recent Labs  Lab 11/03/18 0420  NA 140  K 4.4  CL 105  CO2 28  GLUCOSE 106*  BUN 8  CREATININE 0.45  CALCIUM 8.9  MG 2.0   RADIOLOGY:  No results found. ASSESSMENT AND PLAN:   1. Staph aureus bacteremia, paraspinal abscess, left second toe infection, left knee infection.  Patient on continuous nafcillin infusion.  TEE negative for endocarditis. Infectious disease recommends 4  weeks of IV antibiotics through 7/20 and then another 2 weeks of oral antibiotics of dicloxacillin.  Patient does not want to go to a facility.  Because of the patient's drug history, unsafe to send patient home.  Has a PICC line  We will check with infectious disease if we can switch patient to oral antibiotics as she is requesting.  2. Paraspinal abscess.  Patient had an IR drainage of the abscess but unable to keep a catheter in.  Cx negative.  Was on antibiotics prior to drainage of fluid  3. Left knee septic arthritis and left toe infection.  Patient had procedure on the toe and also a washout procedure on the left knee.  Continue antibiotics as above.  4. Anxiety depression on Wellbutrin and Xanax  5. GERD on Protonix.  6. Polysubstance abuse.    Try to avoid narcotics.  Placed  on PRN Flexeril 5 mg for muscle spasms.   7. Hypokalemia-replace  8.  Anemia of chronic disease is stable  DVT prophylaxis -on Lovenox  All the records are reviewed and case discussed with Care Management/Social Worker. Management plans discussed with the patient, and she is in agreement.  CODE STATUS: Full Code  TOTAL TIME TAKING CARE OF THIS  PATIENT: 25 minutes.   POSSIBLE D/C IN 8 DAYS, DEPENDING ON CLINICAL CONDITION.  [name] M.D on 11/03/2018 at 10:42 AM  Between 7am to 6pm - Pager - [phone]  After 6pm go to [email or web address] - Proofreader  [facility]  Office  [phone]  CC: Primary care physician; Samantha Merles, MD  Note: This dictation was prepared with Dragon dictation along with smaller phrase technology. Any transcriptional errors that result from this process are unintentional.

## 2018-11-03 NOTE — Consult Note (Signed)
PHARMACY CONSULT NOTE - FOLLOW UP  Pharmacy Consult for Electrolyte Monitoring and Replacement   Recent Labs: Potassium (mmol/L)  Date Value  11/03/2018 4.4  06/02/2011 3.8   Magnesium (mg/dL)  Date Value  11/03/2018 2.0   Calcium (mg/dL)  Date Value  11/03/2018 8.9   Calcium, Total (mg/dL)  Date Value  06/02/2011 9.1   Albumin (g/dL)  Date Value  10/19/2018 1.8 (L)  11/27/2016 3.5  06/02/2011 3.8   Phosphorus (mg/dL)  Date Value  10/29/2018 3.9   Sodium (mmol/L)  Date Value  11/03/2018 140  11/27/2016 141  06/02/2011 140   Corrected Ca: 9.66 mg/dL  Assessment: Pharmacy consulted for electrolyte monitoring and replacement in 53 yo female admitted with sepsis secondary to MSSA bacteremia. Currently being treated with continuous infusion Nafcillin which could be the cause of the hypokalemia.   Goal of Therapy: Electrolytes WNL   Plan:   Electrolytes WNL this morning. Potassium trending up.  Patient currently have orders for KCL 28mEq TID. Will adjust dose to KCL 67mEq BID   No additional replacement warranted at this time.   Will recheck electrolytes with morning labs.  Pharmacy will continue to monitor and replace electrolytes as needed.   Pernell Dupre, PharmD, BCPS Clinical Pharmacist 11/03/2018 6:50 AM

## 2018-11-04 DIAGNOSIS — L02612 Cutaneous abscess of left foot: Secondary | ICD-10-CM

## 2018-11-04 LAB — BASIC METABOLIC PANEL
Anion gap: 10 (ref 5–15)
BUN: 10 mg/dL (ref 6–20)
CO2: 26 mmol/L (ref 22–32)
Calcium: 8.9 mg/dL (ref 8.9–10.3)
Chloride: 103 mmol/L (ref 98–111)
Creatinine, Ser: 0.55 mg/dL (ref 0.44–1.00)
GFR calc Af Amer: >60 mL/min (ref >60)
GFR calc non Af Amer: >60 mL/min (ref >60)
Glucose, Bld: 116 mg/dL — ABNORMAL HIGH (ref 70–99)
Potassium: 3.9 mmol/L (ref 3.5–5.1)
Sodium: 139 mmol/L (ref 135–145)

## 2018-11-04 LAB — CBC WITH DIFFERENTIAL/PLATELET
Abs Immature Granulocytes: 0.05 10*3/uL (ref 0.00–0.07)
Basophils Absolute: 0.1 10*3/uL (ref 0.0–0.1)
Basophils Relative: 1 %
Eosinophils Absolute: 0.8 10*3/uL — ABNORMAL HIGH (ref 0.0–0.5)
Eosinophils Relative: 6 %
HCT: 28.4 % — ABNORMAL LOW (ref 36.0–46.0)
Hemoglobin: 8.8 g/dL — ABNORMAL LOW (ref 12.0–15.0)
Immature Granulocytes: 0 %
Lymphocytes Relative: 19 %
Lymphs Abs: 2.5 10*3/uL (ref 0.7–4.0)
MCH: 29.7 pg (ref 26.0–34.0)
MCHC: 31 g/dL (ref 30.0–36.0)
MCV: 95.9 fL (ref 80.0–100.0)
Monocytes Absolute: 1 10*3/uL (ref 0.1–1.0)
Monocytes Relative: 8 %
Neutro Abs: 8.3 10*3/uL — ABNORMAL HIGH (ref 1.7–7.7)
Neutrophils Relative %: 66 %
Platelets: 691 10*3/uL — ABNORMAL HIGH (ref 150–400)
RBC: 2.96 MIL/uL — ABNORMAL LOW (ref 3.87–5.11)
RDW: 15.1 % (ref 11.5–15.5)
WBC: 12.7 10*3/uL — ABNORMAL HIGH (ref 4.0–10.5)
nRBC: 0 % (ref 0.0–0.2)

## 2018-11-04 LAB — SEDIMENTATION RATE: Sed Rate: 106 mm/hr — ABNORMAL HIGH (ref 0–30)

## 2018-11-04 LAB — C-REACTIVE PROTEIN: CRP: 12 mg/dL — ABNORMAL HIGH (ref <1.0)

## 2018-11-04 LAB — MAGNESIUM: Magnesium: 2.1 mg/dL (ref 1.7–2.4)

## 2018-11-04 MED ORDER — CEFAZOLIN SODIUM-DEXTROSE 2-4 GM/100ML-% IV SOLN
2.0000 g | Freq: Three times a day (TID) | INTRAVENOUS | Status: DC
  Administered 2018-11-05 – 2018-11-11 (×18): 2 g via INTRAVENOUS
  Filled 2018-11-04 (×21): qty 100

## 2018-11-04 NOTE — Discharge Instructions (Signed)
Abdominal Pain, Adult    Many things can cause belly (abdominal) pain. Most times, belly pain is not dangerous. Many cases of belly pain can be watched and treated at home. Sometimes belly pain is serious, though. Your doctor will try to find the cause of your belly pain.  Follow these instructions at home:  · Take over-the-counter and prescription medicines only as told by your doctor. Do not take medicines that help you poop (laxatives) unless told to by your doctor.  · Drink enough fluid to keep your pee (urine) clear or pale yellow.  · Watch your belly pain for any changes.  · Keep all follow-up visits as told by your doctor. This is important.  Contact a doctor if:  · Your belly pain changes or gets worse.  · You are not hungry, or you lose weight without trying.  · You are having trouble pooping (constipated) or have watery poop (diarrhea) for more than 2-3 days.  · You have pain when you pee or poop.  · Your belly pain wakes you up at night.  · Your pain gets worse with meals, after eating, or with certain foods.  · You are throwing up and cannot keep anything down.  · You have a fever.  Get help right away if:  · Your pain does not go away as soon as your doctor says it should.  · You cannot stop throwing up.  · Your pain is only in areas of your belly, such as the right side or the left lower part of the belly.  · You have bloody or black poop, or poop that looks like tar.  · You have very bad pain, cramping, or bloating in your belly.  · You have signs of not having enough fluid or water in your body (dehydration), such as:  ? Dark pee, very little pee, or no pee.  ? Cracked lips.  ? Dry mouth.  ? Sunken eyes.  ? Sleepiness.  ? Weakness.  This information is not intended to replace advice given to you by your health care provider. Make sure you discuss any questions you have with your health care provider.  Document Released: 09/27/2007 Document Revised: 10/29/2015 Document Reviewed: 09/22/2015  Elsevier  Interactive Patient Education © 2020 [employer] Inc.

## 2018-11-04 NOTE — Progress Notes (Signed)
ID Pt doing fine Pain upper back better Left knee and toe pain resolved She wanted to go home and then decided to stay and undergo the full course of antibiotic Patient Vitals for the past 24 hrs:  BP Temp Temp src Pulse Resp SpO2  11/04/18 2108 (!) 152/78 97.8 F (36.6 C) Oral 94 (!) 22 95 %  11/04/18 0625 130/87 98.1 F (36.7 C) Oral 93 - 93 %   Chest b/l air entry Hs s1s2 Left knee not swollen Left 2nd toe abscess I/D  healing well  CBC Latest Ref Rng & Units 11/04/2018 10/31/2018 10/29/2018  WBC 4.0 - 10.5 K/uL 12.7(H) 9.5 11.2(H)  Hemoglobin 12.0 - 15.0 g/dL 8.8(L) 8.5(L) 8.5(L)  Hematocrit 36.0 - 46.0 % 28.4(L) 27.5(L) 27.4(L)  Platelets 150 - 400 K/uL 691(H) 663(H) 644(H)    CMP Latest Ref Rng & Units 11/04/2018 11/03/2018 11/02/2018  Glucose 70 - 99 mg/dL 116(H) 106(H) 122(H)  BUN 6 - 20 mg/dL 10 8 <5(L)  Creatinine 0.44 - 1.00 mg/dL 0.55 0.45 0.57  Sodium 135 - 145 mmol/L 139 140 140  Potassium 3.5 - 5.1 mmol/L 3.9 4.4 4.0  Chloride 98 - 111 mmol/L 103 105 105  CO2 22 - 32 mmol/L '26 28 25  ' Calcium 8.9 - 10.3 mg/dL 8.9 8.9 8.4(L)  Total Protein 6.5 - 8.1 g/dL - - -  Total Bilirubin 0.3 - 1.2 mg/dL - - -  Alkaline Phos 38 - 126 U/L - - -  AST 15 - 41 U/L - - -  ALT 0 - 44 U/L - - -   ESR 106 ( 68 on 10/27/18) CRP 12 ( was 26 on 10/27/18)  53 year old female with history of substance use presents with left knee pain, left toe pain and purplish discoloration and also lethargy. Also has been having back pain especially neck pain for which she had 2 prior ED visits on 6/10 and 10/04/2018.   Disseminated staph aureus infection withStaph aureus bacteremia.In a patient with cocaineuse( She denies IVDA) left knee septic arthritis  left toe infection- s/p I/D .TEE neg for endocarditis .On nafcillin concern that ESR is increasing and on reviewing the [facility], the nafcillin was delayed couple of times- She is on a 24 hr infusion- will change to cefazolin 2 grams IV  q8.  Thoracic vertebral infection- Prominent paraspinous phlegmon and enhancement seen extending from T9 through T11, worse on the right. There is a heterogeneous paraspinous abscess at the level of T9-10 that measures 3.0 x 1.4 x 4.6 cm. Drain placement could not be done by IR because of movt. IR aspirated7 cc bloody fluid from right paraspinal fluid collection on 10/22/18. Culture was negative As ESR going up- recommend repeating Thoracic MRI   Pt abuses cocaine high suspicion for IVDA  There is a concern to send her home with PICC. Plan is to give her IV for 4 weeks ( 11/11/18) and then  switch to Po diclox+/- rifampin Discussed the management withpatient and care team

## 2018-11-04 NOTE — Progress Notes (Signed)
Removed sutures from knee this am.  Pt wishes to wait until later today to have dressing changed on toe.  The dressing was changed yesterday afternoon.

## 2018-11-04 NOTE — Progress Notes (Signed)
[place] at [place] NAME: [name]    MR#:  [id number]  DATE OF BIRTH:  05-12-1965  SUBJECTIVE:  CHIEF COMPLAINT:   Chief Complaint  Patient presents with  . Altered Mental Status   Mild chronic back pain.  Afebrile.  REVIEW OF SYSTEMS:  Review of Systems  Constitutional: Negative for chills, fever and weight loss.  HENT: Negative for hearing loss and tinnitus.   Eyes: Negative for blurred vision and double vision.  Respiratory: Negative for cough and hemoptysis.   Cardiovascular: Negative for chest pain and palpitations.  Gastrointestinal: Negative for heartburn, nausea and vomiting.  Genitourinary: Negative for dysuria and urgency.  Skin: Negative for itching and rash.  Neurological: Negative for dizziness and headaches.  Psychiatric/Behavioral: Negative for depression and hallucinations.    DRUG ALLERGIES:   Allergies  Allergen Reactions  . Topiramate Hives   VITALS:  Blood pressure 130/87, pulse 93, temperature 98.1 F (36.7 C), temperature source Oral, resp. rate 20, height '5\' 4"'  (1.626 m), weight 80.7 kg, last menstrual period 01/22/2017, SpO2 93 %. PHYSICAL EXAMINATION:    Physical Exam  Constitutional: She is oriented to person, place, and time. She appears well-developed and well-nourished.  HENT:  Head: Normocephalic and atraumatic.  Right Ear: External ear normal.  Eyes: Pupils are equal, round, and reactive to light. Conjunctivae are normal. Right eye exhibits no discharge.  Neck: Normal range of motion. Neck supple. No thyromegaly present.  Cardiovascular: Normal rate, regular rhythm and normal heart sounds.  Respiratory: Effort normal and breath sounds normal. No respiratory distress.  GI: Soft. Bowel sounds are normal. She exhibits no distension.  Musculoskeletal: Normal range of motion.        General: No edema.  Neurological: She is alert and oriented to person, place, and time. No cranial nerve  deficit.  Skin: Skin is warm. She is not diaphoretic. No erythema.  Psychiatric: She has a normal mood and affect. Her behavior is normal.     LABORATORY PANEL:  Female CBC Recent Labs  Lab 11/04/18 1146  WBC 12.7*  HGB 8.8*  HCT 28.4*  PLT 691*   ------------------------------------------------------------------------------------------------------------------ Chemistries  Recent Labs  Lab 11/04/18 0519  NA 139  K 3.9  CL 103  CO2 26  GLUCOSE 116*  BUN 10  CREATININE 0.55  CALCIUM 8.9  MG 2.1   RADIOLOGY:  No results found. ASSESSMENT AND PLAN:   1. Staph aureus bacteremia, paraspinal abscess, left second toe infection, left knee infection.  Patient on continuous nafcillin infusion.  TEE negative for endocarditis. Infectious disease recommends 4  weeks of IV antibiotics through 7/20 and then another 2 weeks of oral antibiotics of dicloxacillin.  Patient does not want to go to a facility.  Because of the patient's drug history, unsafe to send patient home.  Has a PICC line  ESR checked and elevated.  Will need to continue IV antibiotics.  2. Paraspinal abscess.  Patient had an IR drainage of the abscess but unable to keep a catheter in.  Cx negative.  Was on antibiotics prior to drainage of fluid  3. Left knee septic arthritis and left toe infection.  Patient had procedure on the toe and also a washout procedure on the left knee.  Continue antibiotics as above.  4. Anxiety depression on Wellbutrin and Xanax  5. GERD on Protonix.  6. Polysubstance abuse.    Try to avoid narcotics.  Placed on PRN Flexeril 5 mg for muscle  spasms.   7. Hypokalemia-replace  8.  Anemia of chronic disease is stable  DVT prophylaxis -on Lovenox  All the records are reviewed and case discussed with Care Management/Social Worker. Management plans discussed with the patient, and she is in agreement.  CODE STATUS: Full Code  TOTAL TIME TAKING CARE OF THIS PATIENT: 25 minutes.   POSSIBLE  D/C IN 8 DAYS, DEPENDING ON CLINICAL CONDITION.  [name] M.D on 11/04/2018 at 1:50 PM  Between 7am to 6pm - Pager - [phone]  After 6pm go to [email or web address] - Proofreader  [facility]  Office  [phone]  CC: Primary care physician; Marguerita Merles, MD  Note: This dictation was prepared with Dragon dictation along with smaller phrase technology. Any transcriptional errors that result from this process are unintentional.

## 2018-11-04 NOTE — Consult Note (Signed)
PHARMACY CONSULT NOTE - FOLLOW UP  Pharmacy Consult for Electrolyte Monitoring and Replacement   Recent Labs: Potassium (mmol/L)  Date Value  11/04/2018 3.9  06/02/2011 3.8   Magnesium (mg/dL)  Date Value  11/04/2018 2.1   Calcium (mg/dL)  Date Value  11/04/2018 8.9   Calcium, Total (mg/dL)  Date Value  06/02/2011 9.1   Albumin (g/dL)  Date Value  10/19/2018 1.8 (L)  11/27/2016 3.5  06/02/2011 3.8   Phosphorus (mg/dL)  Date Value  10/29/2018 3.9   Sodium (mmol/L)  Date Value  11/04/2018 139  11/27/2016 141  06/02/2011 140   Corrected Ca: 9.66 mg/dL  Assessment: Pharmacy consulted for electrolyte monitoring and replacement in 53 yo female admitted with sepsis secondary to MSSA bacteremia. Currently being treated with continuous infusion Nafcillin which could be the cause of the hypokalemia.   Goal of Therapy: Electrolytes WNL   Plan:  K 3.9 Mag 2.1 Scr 0.55  Electrolytes WNL this morning. Patient currently has orders for KCL 37mEq PO BID   No additional replacement warranted at this time.   Will recheck electrolytes with morning labs.  Pharmacy will continue to monitor and replace electrolytes as needed.   Noralee Space, PharmD, BCPS Clinical Pharmacist 11/04/2018 7:44 AM

## 2018-11-04 NOTE — Progress Notes (Signed)
Nutrition Follow-up  DOCUMENTATION CODES:   Not applicable  INTERVENTION:   Continue Ensure Enlive po BID, each supplement provides 350 kcal and 20 grams of protein.  Continue daily MVI.  NUTRITION DIAGNOSIS:   Inadequate oral intake related to decreased appetite as evidenced by per patient/family report.  Ongoing.  GOAL:   Patient will meet greater than or equal to 90% of their needs  Progressing.  MONITOR:   PO intake, Supplement acceptance, Labs, Weight trends, Skin, I & O's  ASSESSMENT:   53 year old female with PMHx of anxiety, depression, substance abuse, arthritis, back pain admitted with sepsis, discitis/paraspinous abscess s/p IR guided drainage, MSSA bacteremia, left knee septic arthritis s/p I&D.   Pt continues to do well; pt eating 75-100% of meals and drinking some Ensure, but refusing most. No new weight since 6/26; will request weekly weights.   Medications reviewed and include: colace, MVI, protonix, miralax, KCl, NaCl @20ml /hr, nafcillin  Labs reviewed: K 3.9 wnl, Mg 2.1 wnl Wbc- 12.7(H), Hgb 8.8(L), Hct 28.4(L)  Diet Order:   Diet Order            Diet regular Room service appropriate? Yes; Fluid consistency: Thin  Diet effective now             EDUCATION NEEDS:   Education needs have been addressed  Skin:  Skin Assessment: Skin Integrity Issues:(closed incision left knee, closed incision left toe)  Last BM:  7/12  Height:   Ht Readings from Last 1 Encounters:  10/18/18 5\' 4"  (1.626 m)   Weight:   Wt Readings from Last 1 Encounters:  10/18/18 80.7 kg   Ideal Body Weight:  54.5 kg  BMI:  Body mass index is 30.54 kg/m.  Estimated Nutritional Needs:   Kcal:  1800-2000  Protein:  90-100 grams  Fluid:  1.8-2 L/day  Koleen Distance MS, RD, LDN Pager #- [phone] Office#- [phone] After Hours Pager: [phone]

## 2018-11-05 ENCOUNTER — Inpatient Hospital Stay: Payer: Medicaid Other

## 2018-11-05 LAB — BASIC METABOLIC PANEL
Anion gap: 9 (ref 5–15)
BUN: 11 mg/dL (ref 6–20)
CO2: 27 mmol/L (ref 22–32)
Calcium: 9 mg/dL (ref 8.9–10.3)
Chloride: 102 mmol/L (ref 98–111)
Creatinine, Ser: 0.54 mg/dL (ref 0.44–1.00)
GFR calc Af Amer: >60 mL/min (ref >60)
GFR calc non Af Amer: >60 mL/min (ref >60)
Glucose, Bld: 108 mg/dL — ABNORMAL HIGH (ref 70–99)
Potassium: 4.2 mmol/L (ref 3.5–5.1)
Sodium: 138 mmol/L (ref 135–145)

## 2018-11-05 MED ORDER — POLYETHYLENE GLYCOL 3350 17 G PO PACK: 17.0000 g | PACK | Freq: Every day | ORAL | Status: DC

## 2018-11-05 MED ORDER — SODIUM CHLORIDE 0.9 % IV SOLN
INTRAVENOUS | Status: DC | PRN
  Administered 2018-11-05 – 2018-11-06 (×2): 20 mL via INTRAVENOUS
  Administered 2018-11-07 – 2018-11-10 (×4): 250 mL via INTRAVENOUS

## 2018-11-05 MED ORDER — GADOBUTROL 1 MMOL/ML IV SOLN
7.5000 mL | Freq: Once | INTRAVENOUS | Status: AC | PRN
  Administered 2018-11-05: 7.5 mL via INTRAVENOUS

## 2018-11-05 NOTE — Consult Note (Signed)
PHARMACY CONSULT NOTE - FOLLOW UP  Pharmacy Consult for Electrolyte Monitoring and Replacement   Recent Labs: Potassium (mmol/L)  Date Value  11/05/2018 4.2  06/02/2011 3.8   Magnesium (mg/dL)  Date Value  11/04/2018 2.1   Calcium (mg/dL)  Date Value  11/05/2018 9.0   Calcium, Total (mg/dL)  Date Value  06/02/2011 9.1   Albumin (g/dL)  Date Value  10/19/2018 1.8 (L)  11/27/2016 3.5  06/02/2011 3.8   Phosphorus (mg/dL)  Date Value  10/29/2018 3.9   Sodium (mmol/L)  Date Value  11/05/2018 138  11/27/2016 141  06/02/2011 140     Assessment: Pharmacy consulted for electrolyte monitoring and replacement in 53 yo female admitted with sepsis secondary to MSSA bacteremia. Currently being treated with continuous infusion Nafcillin which could be the cause of the hypokalemia.   Goal of Therapy: Electrolytes WNL   Plan:  K 4.2  Scr 0.54  Electrolytes WNL this morning. Patient currently has orders for KCL 50mEq PO BID   No additional replacement warranted at this time.   Will recheck electrolytes with morning labs.  Pharmacy will continue to monitor and replace electrolytes as needed.   Noralee Space, PharmD, BCPS Clinical Pharmacist 11/05/2018 9:33 AM

## 2018-11-05 NOTE — Progress Notes (Signed)
[place] at [place] NAME: [name]    MR#:  [id number]  DATE OF BIRTH:  30-Nov-1965  SUBJECTIVE:  CHIEF COMPLAINT:   Chief Complaint  Patient presents with  . Altered Mental Status   Mild chronic back pain.  Some pain in left knee. Afebrile.  REVIEW OF SYSTEMS:  Review of Systems  Constitutional: Negative for chills, fever and weight loss.  HENT: Negative for hearing loss and tinnitus.   Eyes: Negative for blurred vision and double vision.  Respiratory: Negative for cough and hemoptysis.   Cardiovascular: Negative for chest pain and palpitations.  Gastrointestinal: Negative for heartburn, nausea and vomiting.  Genitourinary: Negative for dysuria and urgency.  Skin: Negative for itching and rash.  Neurological: Negative for dizziness and headaches.  Psychiatric/Behavioral: Negative for depression and hallucinations.    DRUG ALLERGIES:   Allergies  Allergen Reactions  . Topiramate Hives   VITALS:  Blood pressure (!) 142/86, pulse 91, temperature 98 F (36.7 C), temperature source Oral, resp. rate 19, height _0  (1.626 m), weight 80.7 kg, last menstrual period 01/22/2017, SpO2 96 %. PHYSICAL EXAMINATION:    Physical Exam  Constitutional: She is oriented to person, place, and time. She appears well-developed and well-nourished.  HENT:  Head: Normocephalic and atraumatic.  Right Ear: External ear normal.  Eyes: Pupils are equal, round, and reactive to light. Conjunctivae are normal. Right eye exhibits no discharge.  Neck: Normal range of motion. Neck supple. No thyromegaly present.  Cardiovascular: Normal rate, regular rhythm and normal heart sounds.  Respiratory: Effort normal and breath sounds normal. No respiratory distress.  GI: Soft. Bowel sounds are normal. She exhibits no distension.  Musculoskeletal: Normal range of motion.        General: No edema.  Neurological: She is alert and oriented to person, place, and  time. No cranial nerve deficit.  Skin: Skin is warm. She is not diaphoretic. No erythema.  Psychiatric: She has a normal mood and affect. Her behavior is normal.     LABORATORY PANEL:  Female CBC Recent Labs  Lab 11/04/18 1146  WBC 12.7*  HGB 8.8*  HCT 28.4*  PLT 691*   ------------------------------------------------------------------------------------------------------------------ Chemistries  Recent Labs  Lab 11/04/18 0519 11/05/18 0520  NA 139 138  K 3.9 4.2  CL 103 102  CO2 26 27  GLUCOSE 116* 108*  BUN 10 11  CREATININE 0.55 0.54  CALCIUM 8.9 9.0  MG 2.1  --    RADIOLOGY:  No results found. ASSESSMENT AND PLAN:   1. Staph aureus bacteremia, paraspinal abscess, left second toe infection, left knee infection.  Patient on continuous nafcillin infusion.  TEE negative for endocarditis.   4  weeks of IV antibiotics through 7/20 and then another 2 weeks of oral antibiotics of dicloxacillin.  Patient does not want to go to a facility.  Because of the patient's drug history, unsafe to send patient home.  Has a PICC line  ESR checked and elevated. Ordered repeat MRI of the thoracic spine.  Discussed with Dr. Tama High.  2. Paraspinal abscess.  Patient had an IR drainage of the abscess but unable to keep a catheter in.  Cx negative.  Was on antibiotics prior to drainage of fluid  3. Left knee septic arthritis and left toe infection.  Patient had procedure on the toe and also a washout procedure on the left knee.  Continue antibiotics as above.  4. Anxiety depression on Wellbutrin and Xanax  5. GERD on Protonix.  6. Polysubstance abuse.    Try to avoid narcotics.  Placed on PRN Flexeril 5 mg for muscle spasms.   7. Hypokalemia-replace  8.  Anemia of chronic disease is stable  DVT prophylaxis -on Lovenox  All the records are reviewed and case discussed with Care Management/Social Worker. Management plans discussed with the patient, and she is in agreement.  CODE  STATUS: Full Code  TOTAL TIME TAKING CARE OF THIS PATIENT: 25 minutes.   POSSIBLE D/C IN 6 DAYS, DEPENDING ON CLINICAL CONDITION.  Neita Carp M.D on 11/05/2018 at 2:13 PM  Between 7am to 6pm - Pager - [phone]  After 6pm go to [email or web address] - Proofreader  [facility]  Office  [phone]  CC: Primary care physician; Marguerita Merles, MD  Note: This dictation was prepared with Dragon dictation along with smaller phrase technology. Any transcriptional errors that result from this process are unintentional.

## 2018-11-06 DIAGNOSIS — M4654 Other infective spondylopathies, thoracic region: Secondary | ICD-10-CM

## 2018-11-06 LAB — BASIC METABOLIC PANEL
Anion gap: 11 (ref 5–15)
BUN: 13 mg/dL (ref 6–20)
CO2: 25 mmol/L (ref 22–32)
Calcium: 9.3 mg/dL (ref 8.9–10.3)
Chloride: 102 mmol/L (ref 98–111)
Creatinine, Ser: 0.59 mg/dL (ref 0.44–1.00)
GFR calc Af Amer: >60 mL/min (ref >60)
GFR calc non Af Amer: >60 mL/min (ref >60)
Glucose, Bld: 142 mg/dL — ABNORMAL HIGH (ref 70–99)
Potassium: 4.3 mmol/L (ref 3.5–5.1)
Sodium: 138 mmol/L (ref 135–145)

## 2018-11-06 NOTE — Consult Note (Signed)
PHARMACY CONSULT NOTE - FOLLOW UP  Pharmacy Consult for Electrolyte Monitoring and Replacement   Recent Labs: Potassium (mmol/L)  Date Value  11/06/2018 4.3  06/02/2011 3.8   Magnesium (mg/dL)  Date Value  11/04/2018 2.1   Calcium (mg/dL)  Date Value  11/06/2018 9.3   Calcium, Total (mg/dL)  Date Value  06/02/2011 9.1   Albumin (g/dL)  Date Value  10/19/2018 1.8 (L)  11/27/2016 3.5  06/02/2011 3.8   Phosphorus (mg/dL)  Date Value  10/29/2018 3.9   Sodium (mmol/L)  Date Value  11/06/2018 138  11/27/2016 141  06/02/2011 140     Assessment: Pharmacy consulted for electrolyte monitoring and replacement in 53 yo female admitted with sepsis secondary to MSSA bacteremia. Currently being treated with continuous infusion Nafcillin which could be the cause of the hypokalemia.  Naficillin Now changed to Cefazolin 2gm q8h  Goal of Therapy: Electrolytes WNL   Plan:  K 4.3  Scr 0.59  Electrolytes WNL x 5 days. Patient currently has orders for KCL 75mEq PO BID   No additional replacement warranted at this time.   Will recheck electrolytes in 2 days on 7/17.  Pharmacy will continue to monitor and replace electrolytes as needed.   Noralee Space, PharmD, BCPS Clinical Pharmacist 11/06/2018 8:02 AM

## 2018-11-06 NOTE — TOC Progression Note (Signed)
Transition of Care [facility]) - Progression Note    Patient Details  Name: [name] MRN: [id number] Date of Birth: 07/17/1965  [facility] CM/SW Contact  Shelbie Hutching, RN Phone Number: 11/06/2018, 2:41 PM  Clinical Narrative:    Patient is doing well, she has been up and walking around the unit today.  Patient has 5 more days of IV antibiotics.  Once antibiotics are completed patient should be able to go home.  Patient reports that she has a new house address updated in [place].  Patient's sister is helping with the move.  Sister will pick up patient at discharge and be a support person.  Patient would like home health services - referral given to [facility].     Expected Discharge Plan: Home/Self Care Barriers to Discharge: Continued Medical Work up  Expected Discharge Plan and Services Expected Discharge Plan: Home/Self Care   Discharge Planning Services: CM Consult   Living arrangements for the past 2 months: Single Family Home                                       Social Determinants of Health (SDOH) Interventions    Readmission Risk Interventions No flowsheet data found.

## 2018-11-06 NOTE — Progress Notes (Signed)
[place] at [place] NAME: [name]    MR#:  [id number]  DATE OF BIRTH:  27-May-1965  SUBJECTIVE:  CHIEF COMPLAINT:   Chief Complaint  Patient presents with  . Altered Mental Status   Pain in back and left knee are same.  REVIEW OF SYSTEMS:  Review of Systems  Constitutional: Negative for chills, fever and weight loss.  HENT: Negative for hearing loss and tinnitus.   Eyes: Negative for blurred vision and double vision.  Respiratory: Negative for cough and hemoptysis.   Cardiovascular: Negative for chest pain and palpitations.  Gastrointestinal: Negative for heartburn, nausea and vomiting.  Genitourinary: Negative for dysuria and urgency.  Skin: Negative for itching and rash.  Neurological: Negative for dizziness and headaches.  Psychiatric/Behavioral: Negative for depression and hallucinations.    DRUG ALLERGIES:   Allergies  Allergen Reactions  . Topiramate Hives   VITALS:  Blood pressure 129/79, pulse 94, temperature 97.8 F (36.6 C), temperature source Oral, resp. rate 15, height '5\' 4"'  (1.626 m), weight 80.7 kg, last menstrual period 01/22/2017, SpO2 97 %. PHYSICAL EXAMINATION:    Physical Exam  Constitutional: She is oriented to person, place, and time. She appears well-developed and well-nourished.  HENT:  Head: Normocephalic and atraumatic.  Right Ear: External ear normal.  Eyes: Pupils are equal, round, and reactive to light. Conjunctivae are normal. Right eye exhibits no discharge.  Neck: Normal range of motion. Neck supple. No thyromegaly present.  Cardiovascular: Normal rate, regular rhythm and normal heart sounds.  Respiratory: Effort normal and breath sounds normal. No respiratory distress.  GI: Soft. Bowel sounds are normal. She exhibits no distension.  Musculoskeletal: Normal range of motion.        General: No edema.  Neurological: She is alert and oriented to person, place, and time. No cranial nerve  deficit.  Skin: Skin is warm. She is not diaphoretic. No erythema.  Psychiatric: She has a normal mood and affect. Her behavior is normal.     LABORATORY PANEL:  Female CBC Recent Labs  Lab 11/04/18 1146  WBC 12.7*  HGB 8.8*  HCT 28.4*  PLT 691*   ------------------------------------------------------------------------------------------------------------------ Chemistries  Recent Labs  Lab 11/04/18 0519  11/06/18 0516  NA 139   < > 138  K 3.9   < > 4.3  CL 103   < > 102  CO2 26   < > 25  GLUCOSE 116*   < > 142*  BUN 10   < > 13  CREATININE 0.55   < > 0.59  CALCIUM 8.9   < > 9.3  MG 2.1  --   --    < > = values in this interval not displayed.   RADIOLOGY:  Mr Thoracic Spine W Wo Contrast  Result Date: 11/05/2018 CLINICAL DATA:  53 y/o F; thoracic vertebral infection with prominent paraspinous phlegmon. EXAM: MRI THORACIC WITHOUT AND WITH CONTRAST TECHNIQUE: Multiplanar and multiecho pulse sequences of the thoracic spine were obtained without and with intravenous contrast. CONTRAST:  7.5 cc Gadavist COMPARISON:  10/16/2018 MRI of the thoracic spine. 10/18/2018 CT chest. FINDINGS: MRI THORACIC SPINE FINDINGS Alignment:  Physiologic. Vertebrae: Enhancement and edema within the right T11 vertebral body and pedicle, right T11 rib head, right T11 costovertebral joint, and right T10-11 facet with surrounding inflammation in soft tissues compatible with septic arthritis. Paraspinal phlegmon to the right of T10 and T11 vertebral bodies is decreased in size measuring 1.6 x 0.8 x 2.8  cm (AP x ML x CC series 21 image 3 and series 22 image 30). Persistent enhancement of the left T3-4 facet joint and surrounding soft tissues. No associated fluid collection. Cord: Normal signal and morphology. Ventral epidural fluid collection is decreased in craniocaudal extent extending from T12-L2 (series 21, image 9 and series 25, image 10). Paraspinal and other soft tissues: Moderate right pleural effusion.  Disc levels: T8-9 central disc protrusion with contact on the anterior cord and mild anterior cord flattening. No significant foraminal or spinal canal stenosis. Multilevel disc desiccation. IMPRESSION: 1. Persistent septic arthritis of the right T10-11 facet and right T11 costovertebral joint. Associate right paraspinal abscess is decreased in size measuring up to 2.8 cm, previously 4.6 cm. 2. Stable suspect septic arthritis of left T3-4 facet joint without associated abscess. 3. No residual enhancement of the right T6-7 facets. 4. Decreased craniocaudal extent of ventral epidural collection extending from T12-L2. 5. Moderate right pleural effusion. Electronically Signed   By: Kristine Garbe M.D.   On: 11/05/2018 23:49   ASSESSMENT AND PLAN:   1. Staph aureus bacteremia, paraspinal abscess, left second toe infection, left knee infection.  Patient was on continuous nafcillin infusion. Now changed to cefazolin. TEE negative for endocarditis.   4  weeks of IV antibiotics through 7/20 and then another 2 weeks of oral antibiotics of dicloxacillin.  Patient does not want to go to a facility.  Because of the patient's drug history, unsafe to send patient home.  Has a PICC line  ESR checked and elevated. Repeat thoracic MRi shows improved and stable findings  2. Paraspinal abscess.  Patient had an IR drainage of the abscess but unable to keep a catheter in.  Cx negative.  Was on antibiotics prior to drainage of fluid  3. Left knee septic arthritis and left toe infection.  Patient had procedure on the toe and also a washout procedure on the left knee.  Continue antibiotics as above.  4. Anxiety depression on Wellbutrin and Xanax  5. GERD on Protonix.  6. Polysubstance abuse.    Try to avoid narcotics.  Placed on PRN Flexeril 5 mg for muscle spasms.   7. Hypokalemia-replace  8.  Anemia of chronic disease is stable  DVT prophylaxis -on Lovenox  All the records are reviewed and case  discussed with Care Management/Social Worker. Management plans discussed with the patient, and she is in agreement.  CODE STATUS: Full Code  TOTAL TIME TAKING CARE OF THIS PATIENT: 25 minutes.   POSSIBLE D/C IN 5 DAYS, DEPENDING ON CLINICAL CONDITION.  Neita Carp M.D on 11/06/2018 at 11:58 AM  Between 7am to 6pm - Pager - [phone]  After 6pm go to [email or web address] - Proofreader  [facility]  Office  [phone]  CC: Primary care physician; Marguerita Merles, MD  Note: This dictation was prepared with Dragon dictation along with smaller phrase technology. Any transcriptional errors that result from this process are unintentional.

## 2018-11-06 NOTE — Progress Notes (Signed)
ID Feeling okay C/o lumbar pain due to lying in bed Thoracic mid back pain better Patient Vitals for the past 24 hrs:  BP Temp Temp src Pulse Resp SpO2  11/06/18 0457 129/79 97.8 F (36.6 C) Oral 94 15 97 %  11/05/18 1912 128/88 98.3 F (36.8 C) Oral 99 - 97 %  11/05/18 1444 136/75 98.7 F (37.1 C) Oral 97 18 93 %   Chest CTA  CBC Latest Ref Rng & Units 11/04/2018 10/31/2018 10/29/2018  WBC 4.0 - 10.5 K/uL 12.7(H) 9.5 11.2(H)  Hemoglobin 12.0 - 15.0 g/dL 8.8(L) 8.5(L) 8.5(L)  Hematocrit 36.0 - 46.0 % 28.4(L) 27.5(L) 27.4(L)  Platelets 150 - 400 K/uL 691(H) 663(H) 644(H)    CMP Latest Ref Rng & Units 11/06/2018 11/05/2018 11/04/2018  Glucose 70 - 99 mg/dL 142(H) 108(H) 116(H)  BUN 6 - 20 mg/dL 13 11 10  Creatinine 0.44 - 1.00 mg/dL 0.59 0.54 0.55  Sodium 135 - 145 mmol/L 138 138 139  Potassium 3.5 - 5.1 mmol/L 4.3 4.2 3.9  Chloride 98 - 111 mmol/L 102 102 103  CO2 22 - 32 mmol/L 25 27 26  Calcium 8.9 - 10.3 mg/dL 9.3 9.0 8.9  Total Protein 6.5 - 8.1 g/dL - - -  Total Bilirubin 0.3 - 1.2 mg/dL - - -  Alkaline Phos 38 - 126 U/L - - -  AST 15 - 41 U/L - - -  ALT 0 - 44 U/L - - -       MRI of the thoracic spine done on 11/05/2018 shows persistent septic arthritis of the right T10-11 facet and right T11 costovertebral joint.  Associated right paraspinal abscess decreased in size now measuring only 2.8 cm and previously it was 4.6 cm.  stable suspect septic arthritis of the left left T3-T4 facet joint without associated abscess.  Decreased craniocaudal extent of ventral epidural collection extending from T12-L2.  Moderate right pleural effusion.   MSSA bacteremia with T-T12 discitis, septic arthritis of the costovertebral junction   Impression and recommendation MSSA bacteremia with disseminated infection.  She has left septic arthritis, left great toe infection which both have resolved.  She does have extensive thoracic spine infection and the paraspinal abscess collection is better  than before. She was on nafcillin and has been changed to cefazolin because of some missed doses.  Her last ESR had gone up to 103.  We will check another ESR on Monday and if it is trending down then we will be able to complete the course with oral antibiotics.  But if the ESR is persistently high we may have to extend the intravenous for another 2 weeks.  History of cocaine use -urine tox screen positive for cocaine.  Hypokalemia has resolved. Discussed the management with the patient 

## 2018-11-07 NOTE — Progress Notes (Signed)
Nutrition Follow-up  DOCUMENTATION CODES:   Not applicable  INTERVENTION:   Continue Ensure Enlive po BID, each supplement provides 350 kcal and 20 grams of protein.  Continue daily MVI.  NUTRITION DIAGNOSIS:   Inadequate oral intake related to decreased appetite as evidenced by per patient/family report.  Ongoing.  GOAL:   Patient will meet greater than or equal to 90% of their needs  Progressing.  MONITOR:   PO intake, Supplement acceptance, Labs, Weight trends, Skin, I & O's  ASSESSMENT:   53 year old female with PMHx of anxiety, depression, substance abuse, arthritis, back pain admitted with sepsis, discitis/paraspinous abscess s/p IR guided drainage, MSSA bacteremia, left knee septic arthritis s/p I&D.   Pt continues to do well; pt eating 75-100% of meals and drinking some Ensure. No new weight since 6/26; will request weekly weights.   Medications reviewed and include: colace, MVI, protonix, miralax, KCl, cefazolin   Labs reviewed:   Diet Order:   Diet Order            Diet regular Room service appropriate? Yes; Fluid consistency: Thin  Diet effective now             EDUCATION NEEDS:   Education needs have been addressed  Skin:  Skin Assessment: Skin Integrity Issues:(closed incision left knee, closed incision left toe)  Last BM:  7/14  Height:   Ht Readings from Last 1 Encounters:  10/18/18 5\' 4"  (1.626 m)   Weight:   Wt Readings from Last 1 Encounters:  10/18/18 80.7 kg   Ideal Body Weight:  54.5 kg  BMI:  Body mass index is 30.54 kg/m.  Estimated Nutritional Needs:   Kcal:  1800-2000  Protein:  90-100 grams  Fluid:  1.8-2 L/day  Koleen Distance MS, RD, LDN Pager #- [phone] Office#- [phone] After Hours Pager: [phone]

## 2018-11-07 NOTE — Progress Notes (Addendum)
Made Dr. Bridgett Larsson aware of patient's worsening cough.  Suggested CXR.  Dr. Bridgett Larsson provided no new orders.  Clarise Cruz, BSN

## 2018-11-07 NOTE — Progress Notes (Signed)
[place] at [place] NAME: [name]    MR#:  [id number]  DATE OF BIRTH:  21-Mar-1966  SUBJECTIVE:  CHIEF COMPLAINT:   Chief Complaint  Patient presents with  . Altered Mental Status   chronic back pain.  REVIEW OF SYSTEMS:  Review of Systems  Constitutional: Negative for chills, fever and weight loss.  HENT: Negative for hearing loss and tinnitus.   Eyes: Negative for blurred vision and double vision.  Respiratory: Negative for cough and hemoptysis.   Cardiovascular: Negative for chest pain and palpitations.  Gastrointestinal: Negative for heartburn, nausea and vomiting.  Genitourinary: Negative for dysuria and urgency.  Musculoskeletal: Positive for back pain and joint pain.  Skin: Negative for itching and rash.  Neurological: Negative for dizziness and headaches.  Psychiatric/Behavioral: Negative for depression and hallucinations.    DRUG ALLERGIES:   Allergies  Allergen Reactions  . Topiramate Hives   VITALS:  Blood pressure 115/78, pulse 96, temperature 98.3 F (36.8 C), temperature source Oral, resp. rate 14, height 5' 4" (1.626 m), weight 80.7 kg, last menstrual period 01/22/2017, SpO2 97 %. PHYSICAL EXAMINATION:    Physical Exam  Constitutional: She is oriented to person, place, and time. She appears well-developed and well-nourished.  HENT:  Head: Normocephalic and atraumatic.  Right Ear: External ear normal.  Eyes: Pupils are equal, round, and reactive to light. Conjunctivae are normal. Right eye exhibits no discharge.  Neck: Normal range of motion. Neck supple. No thyromegaly present.  Cardiovascular: Normal rate, regular rhythm and normal heart sounds.  Respiratory: Effort normal and breath sounds normal. No respiratory distress.  GI: Soft. Bowel sounds are normal. She exhibits no distension.  Musculoskeletal: Normal range of motion.        General: No edema.  Neurological: She is alert and oriented to  person, place, and time. No cranial nerve deficit.  Skin: Skin is warm. She is not diaphoretic. No erythema.  Psychiatric: She has a normal mood and affect. Her behavior is normal.     LABORATORY PANEL:  Female CBC Recent Labs  Lab 11/04/18 1146  WBC 12.7*  HGB 8.8*  HCT 28.4*  PLT 691*   ------------------------------------------------------------------------------------------------------------------ Chemistries  Recent Labs  Lab 11/04/18 0519  11/06/18 0516  NA 139   < > 138  K 3.9   < > 4.3  CL 103   < > 102  CO2 26   < > 25  GLUCOSE 116*   < > 142*  BUN 10   < > 13  CREATININE 0.55   < > 0.59  CALCIUM 8.9   < > 9.3  MG 2.1  --   --    < > = values in this interval not displayed.   RADIOLOGY:  No results found. ASSESSMENT AND PLAN:   1. Staph aureus bacteremia, paraspinal abscess, left second toe infection, left knee infection.  Patient was on continuous nafcillin infusion. Now changed to cefazolin. TEE negative for endocarditis.   4  weeks of IV antibiotics through 7/20 and then another 2 weeks of oral antibiotics of dicloxacillin.  Patient does not want to go to a facility.  Because of the patient's drug history, unsafe to send patient home.  Has a PICC line  ESR checked and elevated. Repeat thoracic MRI showed improved and stable findings  Plan is to check ESR again on Monday to decide transition to PO abx.  2. Paraspinal abscess.  Patient had an IR drainage of  the abscess but unable to keep a catheter in.  Cx negative.  Was on antibiotics prior to drainage of fluid  3. Left knee septic arthritis and left toe infection.  Patient had procedure on the toe and also a washout procedure on the left knee.  Continue antibiotics as above.  4. Anxiety depression on Wellbutrin and Xanax  5. GERD on Protonix.  6. Polysubstance abuse.    Try to avoid narcotics.  Placed on PRN Flexeril 5 mg for muscle spasms.   7. Hypokalemia-replace  8.  Anemia of chronic disease is  stable  DVT prophylaxis -on Lovenox  All the records are reviewed and case discussed with Care Management/Social Worker. Management plans discussed with the patient, and she is in agreement.  CODE STATUS: Full Code  TOTAL TIME TAKING CARE OF THIS PATIENT: 25 minutes.   POSSIBLE D/C IN 5 DAYS, DEPENDING ON CLINICAL CONDITION.   R  M.D on 11/07/2018 at 1:36 PM  Between 7am to 6pm - Pager - [phone]  After 6pm go to [email or web address] - password [email or web address]  [facility]  Office  [phone]  CC: Primary care physician; Miles, Linda M, MD  Note: This dictation was prepared with Dragon dictation along with smaller phrase technology. Any transcriptional errors that result from this process are unintentional. 

## 2018-11-08 DIAGNOSIS — M0000 Staphylococcal arthritis, unspecified joint: Secondary | ICD-10-CM

## 2018-11-08 LAB — BASIC METABOLIC PANEL
Anion gap: 12 (ref 5–15)
BUN: 12 mg/dL (ref 6–20)
CO2: 26 mmol/L (ref 22–32)
Calcium: 9.6 mg/dL (ref 8.9–10.3)
Chloride: 99 mmol/L (ref 98–111)
Creatinine, Ser: 0.67 mg/dL (ref 0.44–1.00)
GFR calc Af Amer: >60 mL/min (ref >60)
GFR calc non Af Amer: >60 mL/min (ref >60)
Glucose, Bld: 105 mg/dL — ABNORMAL HIGH (ref 70–99)
Potassium: 5 mmol/L (ref 3.5–5.1)
Sodium: 137 mmol/L (ref 135–145)

## 2018-11-08 MED ORDER — OXYCODONE HCL 5 MG PO TABS
5.0000 mg | ORAL_TABLET | Freq: Four times a day (QID) | ORAL | Status: DC | PRN
  Administered 2018-11-08: 13:00:00 5 mg via ORAL
  Filled 2018-11-08: qty 1

## 2018-11-08 MED ORDER — OXYCODONE-ACETAMINOPHEN 5-325 MG PO TABS
1.0000 | ORAL_TABLET | ORAL | Status: DC | PRN
  Administered 2018-11-08 – 2018-11-11 (×12): 1 via ORAL
  Filled 2018-11-08 (×13): qty 1

## 2018-11-08 MED ORDER — POTASSIUM CHLORIDE CRYS ER 20 MEQ PO TBCR
20.0000 meq | EXTENDED_RELEASE_TABLET | Freq: Every day | ORAL | Status: DC
  Administered 2018-11-09: 10:00:00 20 meq via ORAL
  Filled 2018-11-08: qty 1

## 2018-11-08 NOTE — TOC Progression Note (Signed)
Transition of Care [facility]) - Progression Note    Patient Details  Name: [name] MRN: [id number] Date of Birth: 06-28-1965  [facility] CM/SW Contact  Shelbie Hutching, RN Phone Number: 11/08/2018, 3:22 PM  Clinical Narrative:    Patient reports that she does not think she will need home health at discharge.  RNCM agrees with this as patient has been walking laps around the nursing station.  Once IV antibiotics are completed on 7/20 patient will discharge home.    Expected Discharge Plan: Home/Self Care Barriers to Discharge: Continued Medical Work up  Expected Discharge Plan and Services Expected Discharge Plan: Home/Self Care   Discharge Planning Services: CM Consult   Living arrangements for the past 2 months: Single Family Home                                       Social Determinants of Health (SDOH) Interventions    Readmission Risk Interventions No flowsheet data found.

## 2018-11-08 NOTE — Progress Notes (Signed)
ID  Pt doing okay Patient Vitals for the past 24 hrs:  BP Temp Temp src Pulse Resp SpO2  11/08/18 0527 (!) 141/77 98.2 F (36.8 C) Oral 96 - 97 %  11/07/18 1934 (!) 123/94 98.6 F (37 C) - 100 20 97 %  11/07/18 1545 (!) 145/78 - - 98 - -  11/07/18 1522 (!) 150/105 98.1 F (36.7 C) Oral (!) 101 20 97 %   O/e chest CTA Left knee swelling resolved Left 3rd  toe- wound has healed     CBC Latest Ref Rng & Units 11/04/2018 10/31/2018 10/29/2018  WBC 4.0 - 10.5 K/uL 12.7(H) 9.5 11.2(H)  Hemoglobin 12.0 - 15.0 g/dL 8.8(L) 8.5(L) 8.5(L)  Hematocrit 36.0 - 46.0 % 28.4(L) 27.5(L) 27.4(L)  Platelets 150 - 400 K/uL 691(H) 663(H) 644(H)   Sed rate 10/14/18 56 10/27/18-68 11/04/18 -106 CMP Latest Ref Rng & Units 11/08/2018 11/06/2018 11/05/2018  Glucose 70 - 99 mg/dL 105(H) 142(H) 108(H)  BUN 6 - 20 mg/dL '12 13 11  ' Creatinine 0.44 - 1.00 mg/dL 0.67 0.59 0.54  Sodium 135 - 145 mmol/L 137 138 138  Potassium 3.5 - 5.1 mmol/L 5.0 4.3 4.2  Chloride 98 - 111 mmol/L 99 102 102  CO2 22 - 32 mmol/L '26 25 27  ' Calcium 8.9 - 10.3 mg/dL 9.6 9.3 9.0  Total Protein 6.5 - 8.1 g/dL - - -  Total Bilirubin 0.3 - 1.2 mg/dL - - -  Alkaline Phos 38 - 126 U/L - - -  AST 15 - 41 U/L - - -  ALT 0 - 44 U/L - - -              MRI of the thoracic spine done on 11/05/2018 shows persistent septic arthritis of the right T10-11 facet and right T11 costovertebral joint.  Associated right paraspinal abscess decreased in size now measuring only 2.8 cm and previously it was 4.6 cm.  stable suspect septic arthritis of the left left T3-T4 facet joint without associated abscess.  Decreased craniocaudal extent of ventral epidural collection extending from T12-L2.  Moderate right pleural effusion.   MSSA bacteremia with T-T12 discitis, septic arthritis of the costovertebral junction   Impression and recommendation MSSA bacteremia with disseminated infection.  She has left septic arthritis, left great toe infection which both  have resolved.  She does have extensive thoracic spine infection and the paraspinal abscess collection is better than before. She was on nafcillin and has been changed to cefazolin because of some missed doses.  Her last ESR had gone up to 103.  We will check another ESR on 11/10/18 and if it is trending down then we will be able to complete the course with oral antibiotics.  But if the ESR is persistently high we may have to extend the intravenous for another 2 weeks.  History of cocaine use -urine tox screen positive for cocaine.  Hypokalemia has resolved.  Discussed the management with the patient ID will follow peripherally this weekend , call if needed

## 2018-11-08 NOTE — Progress Notes (Addendum)
[place] at [place] NAME: [name]    MR#:  [id number]  DATE OF BIRTH:  Apr 29, 1965  SUBJECTIVE:  CHIEF COMPLAINT:   Chief Complaint  Patient presents with  . Altered Mental Status   Chronic back pain. Ambulating  Patient accidentally pulled her midline out yesterday.  We placed peripheral IV as she has only 4 more days left for IV antibiotics in the hospital.  REVIEW OF SYSTEMS:  Review of Systems  Constitutional: Negative for chills, fever and weight loss.  HENT: Negative for hearing loss and tinnitus.   Eyes: Negative for blurred vision and double vision.  Respiratory: Negative for cough and hemoptysis.   Cardiovascular: Negative for chest pain and palpitations.  Gastrointestinal: Negative for heartburn, nausea and vomiting.  Genitourinary: Negative for dysuria and urgency.  Musculoskeletal: Positive for back pain and joint pain.  Skin: Negative for itching and rash.  Neurological: Negative for dizziness and headaches.  Psychiatric/Behavioral: Negative for depression and hallucinations.    DRUG ALLERGIES:   Allergies  Allergen Reactions  . Topiramate Hives   VITALS:  Blood pressure (!) 141/77, pulse 96, temperature 98.2 F (36.8 C), temperature source Oral, resp. rate 20, height '5\' 4"'  (1.626 m), weight 80.7 kg, last menstrual period 01/22/2017, SpO2 97 %. PHYSICAL EXAMINATION:    Physical Exam  Constitutional: She is oriented to person, place, and time. She appears well-developed and well-nourished.  HENT:  Head: Normocephalic and atraumatic.  Right Ear: External ear normal.  Eyes: Pupils are equal, round, and reactive to light. Conjunctivae are normal. Right eye exhibits no discharge.  Neck: Normal range of motion. Neck supple. No thyromegaly present.  Cardiovascular: Normal rate, regular rhythm and normal heart sounds.  Respiratory: Effort normal and breath sounds normal. No respiratory distress.  GI: Soft. Bowel  sounds are normal. She exhibits no distension.  Musculoskeletal: Normal range of motion.        General: No edema.  Neurological: She is alert and oriented to person, place, and time. No cranial nerve deficit.  Skin: Skin is warm. She is not diaphoretic. No erythema.  Psychiatric: She has a normal mood and affect. Her behavior is normal.     LABORATORY PANEL:  Female CBC Recent Labs  Lab 11/04/18 1146  WBC 12.7*  HGB 8.8*  HCT 28.4*  PLT 691*   ------------------------------------------------------------------------------------------------------------------ Chemistries  Recent Labs  Lab 11/04/18 0519  11/08/18 0541  NA 139   < > 137  K 3.9   < > 5.0  CL 103   < > 99  CO2 26   < > 26  GLUCOSE 116*   < > 105*  BUN 10   < > 12  CREATININE 0.55   < > 0.67  CALCIUM 8.9   < > 9.6  MG 2.1  --   --    < > = values in this interval not displayed.   RADIOLOGY:  No results found. ASSESSMENT AND PLAN:   1. Staph aureus bacteremia, paraspinal abscess, left second toe infection, left knee infection.  Patient was on continuous nafcillin infusion. Now changed to cefazolin. TEE negative for endocarditis.   4  weeks of IV antibiotics through 7/20 and then another 2 weeks of oral antibiotics of dicloxacillin.  Patient does not want to go to a facility.  Because of the patient's drug history, unsafe to send patient home.    Patient pulled her midline out yesterday.  Peripheral IV placed.  ESR  checked and elevated. Repeat thoracic MRI showed improved and stable findings  Plan is to check ESR again on Sunday to decide transition to PO abx.  This has been ordered  2. Paraspinal abscess.  Patient had an IR drainage of the abscess but unable to keep a catheter in.  Cx negative.  Was on antibiotics prior to drainage of fluid  3. Left knee septic arthritis and left toe infection.  Patient had procedure on the toe and also a washout procedure on the left knee.  Continue antibiotics as above.   4. Anxiety depression on Wellbutrin and Xanax  5. GERD on Protonix.  6. Polysubstance abuse.    Try to avoid narcotics.  Placed on PRN Flexeril 5 mg for muscle spasms.   7. Hypokalemia-replace  8.  Anemia of chronic disease is stable  DVT prophylaxis -on Lovenox  All the records are reviewed and case discussed with Care Management/Social Worker. Management plans discussed with the patient, and she is in agreement.  CODE STATUS: Full Code  TOTAL TIME TAKING CARE OF THIS PATIENT: 25 minutes.   POSSIBLE D/C IN 5 DAYS, DEPENDING ON CLINICAL CONDITION.  Neita Carp M.D on 11/08/2018 at 11:01 AM  Between 7am to 6pm - Pager - [phone]  After 6pm go to [email or web address] - Proofreader  [facility]  Office  [phone]  CC: Primary care physician; Marguerita Merles, MD  Note: This dictation was prepared with Dragon dictation along with smaller phrase technology. Any transcriptional errors that result from this process are unintentional.

## 2018-11-09 MED ORDER — ENOXAPARIN SODIUM 40 MG/0.4ML ~~LOC~~ SOLN: 40.0000 mg | SUBCUTANEOUS | Status: DC

## 2018-11-09 NOTE — Progress Notes (Signed)
[facility] [place] NAME: Carmelina Balducci    MR#:  [id number]  DATE OF BIRTH:  [birth year]  SUBJECTIVE:  CHIEF COMPLAINT:   Chief Complaint  Patient presents with  . Altered Mental Status   No new complaint this morning.  No fevers.  REVIEW OF SYSTEMS:  Review of Systems  Constitutional: Negative for chills and fever.  HENT: Negative for hearing loss and tinnitus.   Eyes: Negative for blurred vision and double vision.  Respiratory: Negative for cough, hemoptysis and shortness of breath.   Cardiovascular: Negative for chest pain.  Gastrointestinal: Negative for heartburn and nausea.  Genitourinary: Negative for dysuria.  Musculoskeletal: Negative for back pain and myalgias.  Skin: Negative for itching and rash.  Neurological: Negative for dizziness and headaches.  Psychiatric/Behavioral: Negative for depression and hallucinations.    DRUG ALLERGIES:   Allergies  Allergen Reactions  . Topiramate Hives   VITALS:  Blood pressure 129/85, pulse 97, temperature 97.8 F (36.6 C), temperature source Oral, resp. rate 19, height '5\' 4"'  (1.626 m), weight 80.7 kg, last menstrual period 01/22/2017, SpO2 98 %. PHYSICAL EXAMINATION:  Physical Exam  Physical Exam  Constitutional: She is oriented to person, place, and time. She appears well-developed and well-nourished.  HENT:  Head: Normocephalic and atraumatic.  Right Ear: External ear normal.  Eyes: Pupils are equal, round, and reactive to light. Conjunctivae are normal. Right eye exhibits no discharge.  Neck: Normal range of motion. Neck supple. No thyromegaly present.  Cardiovascular: Normal rate, regular rhythm and normal heart sounds.  Respiratory: Effort normal and breath sounds normal. No respiratory distress.  GI: Soft. Bowel sounds are normal. She exhibits no distension.  Musculoskeletal: Normal range of motion.        General: No edema.  Neurological: She is alert and oriented to  person, place, and time. No cranial nerve deficit.  Skin: Skin is warm. She is not diaphoretic. No erythema.  Psychiatric: She has a normal mood and affect. Her behavior is normal.  LABORATORY PANEL:  Female CBC Recent Labs  Lab 11/04/18 1146  WBC 12.7*  HGB 8.8*  HCT 28.4*  PLT 691*   ------------------------------------------------------------------------------------------------------------------ Chemistries  Recent Labs  Lab 11/04/18 0519  11/08/18 0541  NA 139   < > 137  K 3.9   < > 5.0  CL 103   < > 99  CO2 26   < > 26  GLUCOSE 116*   < > 105*  BUN 10   < > 12  CREATININE 0.55   < > 0.67  CALCIUM 8.9   < > 9.6  MG 2.1  --   --    < > = values in this interval not displayed.   RADIOLOGY:  No results found. ASSESSMENT AND PLAN:    1. Staph aureus bacteremia, paraspinal abscess, left second toe infection, left knee infection. Patient was on continuous nafcillin infusion. Now changed to cefazolin. TEE negative for endocarditis. 4 weeks of IV antibiotics through 7/20 and then another 2 weeks of oral antibiotics of dicloxacillin. Patient does not want to go to a facility. Because of the patient's drug history, unsafe to send patient home.  Patient pulled her midline out recently.  Peripheral IV placed.  Recent elevation in ESR.  Scheduled for repeat ESR and C-reactive protein tomorrow to ensure trending down with plans to transition to p.o. antibiotics on Monday if ESR trending down..  Repeat thoracic MRI showed improved and stable findings  2. Paraspinal abscess. Patient had an IR drainage of the abscess but unable to keep a catheter in. Cx negative.  Was on antibiotics prior to drainage of fluid  3. Left knee septic arthritis and left toe infection. Patient had procedure on the toe and also a washout procedure on the left knee.  Continue antibiotics as above.  4. Anxiety depression on Wellbutrin and Xanax  5. GERD on Protonix.  6. Polysubstance abuse.    Try to avoid narcotics.  Placed on PRN Flexeril 5 mg for muscle spasms.   7. Hypokalemia-replaced previously.  Follow-up on repeat levels in a.m.  8.  Anemia of chronic disease is stable  DVT prophylaxis ; Lovenox   All the records are reviewed and case discussed with Care Management/Social Worker. Management plans discussed with the patient, family and they are in agreement.  CODE STATUS: Full Code  TOTAL TIME TAKING CARE OF THIS PATIENT: 36 minutes.   More than 50% of the time was spent in counseling/coordination of care: YES  POSSIBLE D/C IN 2 DAYS, DEPENDING ON CLINICAL CONDITION.   Retha Bither M.D on 11/09/2018 at 12:14 PM  Between 7am to 6pm - Pager - [phone]  After 6pm go to [email or web address] - Proofreader  [facility]  Office  [phone]  CC: Primary care physician; Marguerita Merles, MD  Note: This dictation was prepared with Dragon dictation along with smaller phrase technology. Any transcriptional errors that result from this process are unintentional.

## 2018-11-10 LAB — BASIC METABOLIC PANEL
Anion gap: 10 (ref 5–15)
BUN: 11 mg/dL (ref 6–20)
CO2: 27 mmol/L (ref 22–32)
Calcium: 9.1 mg/dL (ref 8.9–10.3)
Chloride: 98 mmol/L (ref 98–111)
Creatinine, Ser: 0.58 mg/dL (ref 0.44–1.00)
GFR calc Af Amer: >60 mL/min (ref >60)
GFR calc non Af Amer: >60 mL/min (ref >60)
Glucose, Bld: 141 mg/dL — ABNORMAL HIGH (ref 70–99)
Potassium: 3.9 mmol/L (ref 3.5–5.1)
Sodium: 135 mmol/L (ref 135–145)

## 2018-11-10 LAB — CBC
HCT: 30.7 % — ABNORMAL LOW (ref 36.0–46.0)
Hemoglobin: 9.5 g/dL — ABNORMAL LOW (ref 12.0–15.0)
MCH: 29.6 pg (ref 26.0–34.0)
MCHC: 30.9 g/dL (ref 30.0–36.0)
MCV: 95.6 fL (ref 80.0–100.0)
Platelets: 798 10*3/uL — ABNORMAL HIGH (ref 150–400)
RBC: 3.21 MIL/uL — ABNORMAL LOW (ref 3.87–5.11)
RDW: 14.1 % (ref 11.5–15.5)
WBC: 10 10*3/uL (ref 4.0–10.5)
nRBC: 0 % (ref 0.0–0.2)

## 2018-11-10 LAB — C-REACTIVE PROTEIN: CRP: 11.9 mg/dL — ABNORMAL HIGH (ref <1.0)

## 2018-11-10 LAB — MAGNESIUM: Magnesium: 2.1 mg/dL (ref 1.7–2.4)

## 2018-11-10 LAB — SEDIMENTATION RATE: Sed Rate: 15 mm/hr (ref 0–30)

## 2018-11-10 NOTE — Plan of Care (Signed)
  Problem: Education: Goal: Knowledge of General Education information will improve Description: Including pain rating scale, medication(s)/side effects and non-pharmacologic comfort measures Outcome: Progressing   Problem: Clinical Measurements: Goal: Ability to maintain clinical measurements within normal limits will improve Outcome: Progressing   Problem: Pain Managment: Goal: General experience of comfort will improve Outcome: Progressing   Problem: Safety: Goal: Ability to remain free from injury will improve Outcome: Progressing   Problem: Skin Integrity: Goal: Risk for impaired skin integrity will decrease Outcome: Progressing   Problem: Clinical Measurements: Goal: Signs and symptoms of infection will decrease Outcome: Progressing

## 2018-11-10 NOTE — Progress Notes (Signed)
[facility] [place] NAME: [name]    MR#:  [id number]  DATE OF BIRTH:  12-25-1965  SUBJECTIVE:  CHIEF COMPLAINT:   Chief Complaint  Patient presents with  . Altered Mental Status   No new complaint this morning.  No fevers.  REVIEW OF SYSTEMS:  Review of Systems  Constitutional: Negative for chills and fever.  HENT: Negative for hearing loss and tinnitus.   Eyes: Negative for blurred vision and double vision.  Respiratory: Negative for cough, hemoptysis and shortness of breath.   Cardiovascular: Negative for chest pain.  Gastrointestinal: Negative for heartburn and nausea.  Genitourinary: Negative for dysuria.  Musculoskeletal: Negative for back pain and myalgias.  Skin: Negative for itching and rash.  Neurological: Negative for dizziness and headaches.  Psychiatric/Behavioral: Negative for depression and hallucinations.    DRUG ALLERGIES:   Allergies  Allergen Reactions  . Topiramate Hives   VITALS:  Blood pressure 126/88, pulse 95, temperature 98 F (36.7 C), temperature source Axillary, resp. rate 15, height _0  (1.626 m), weight 80.7 kg, last menstrual period 01/22/2017, SpO2 97 %. PHYSICAL EXAMINATION:  Physical Exam  Physical Exam  Constitutional: She is oriented to person, place, and time. She appears well-developed and well-nourished.  HENT:  Head: Normocephalic and atraumatic.  Right Ear: External ear normal.  Eyes: Pupils are equal, round, and reactive to light. Conjunctivae are normal. Right eye exhibits no discharge.  Neck: Normal range of motion. Neck supple. No thyromegaly present.  Cardiovascular: Normal rate, regular rhythm and normal heart sounds.  Respiratory: Effort normal and breath sounds normal. No respiratory distress.  GI: Soft. Bowel sounds are normal. She exhibits no distension.  Musculoskeletal: Normal range of motion.        General: No edema.  Neurological: She is alert and oriented to  person, place, and time. No cranial nerve deficit.  Skin: Skin is warm. She is not diaphoretic. No erythema.  Psychiatric: She has a normal mood and affect. Her behavior is normal.  LABORATORY PANEL:  Female CBC Recent Labs  Lab 11/10/18 0507  WBC 10.0  HGB 9.5*  HCT 30.7*  PLT 798*   ------------------------------------------------------------------------------------------------------------------ Chemistries  Recent Labs  Lab 11/10/18 0507  NA 135  K 3.9  CL 98  CO2 27  GLUCOSE 141*  BUN 11  CREATININE 0.58  CALCIUM 9.1  MG 2.1   RADIOLOGY:  No results found. ASSESSMENT AND PLAN:    Staph aureus bacteremia, paraspinal abscess, left second toe infection, left knee infection. Patient was on continuous nafcillin infusion. Now changed to cefazolin. TEE negative for endocarditis. 4 weeks of IV antibiotics through 7/20 and then another 2 weeks of oral antibiotics of dicloxacillin. Patient does not want to go to a facility. Because of the patient's drug history, unsafe to send patient home.  Patient pulled her midline out recently.  Peripheral IV placed.  Recent elevation in ESR to 106.  Repeat ESR this morning down to 15.  Plan is to transition to p.o. antibiotics tomorrow on discharge.  Repeat thoracic MRI showed improved and stable findings  2. Paraspinal abscess. Patient had an IR drainage of the abscess but unable to keep a catheter in. Cx negative.  Was on antibiotics prior to drainage of fluid  3. Left knee septic arthritis and left toe infection. Patient had procedure on the toe and also a washout procedure on the left knee.  Continue antibiotics as above.  4. Anxiety depression on Wellbutrin  and Xanax  5. GERD on Protonix.  6. Polysubstance abuse.   Try to avoid narcotics.  Placed on PRN Flexeril 5 mg for muscle spasms.   7. Hypokalemia-replaced previously.   8.  Anemia of chronic disease is stable  DVT prophylaxis ; Lovenox   All the records  are reviewed and case discussed with Care Management/Social Worker. Management plans discussed with the patient, family and they are in agreement.  CODE STATUS: Full Code  TOTAL TIME TAKING CARE OF THIS PATIENT: 26 minutes.   More than 50% of the time was spent in counseling/coordination of care: YES  POSSIBLE D/C IN 1 DAY, DEPENDING ON CLINICAL CONDITION.   [name] M.D on 11/10/2018 at 11:19 AM  Between 7am to 6pm - Pager - [phone]  After 6pm go to [email or web address] - Proofreader  [facility]  Office  [phone]  CC: Primary care physician; Marguerita Merles, MD  Note: This dictation was prepared with Dragon dictation along with smaller phrase technology. Any transcriptional errors that result from this process are unintentional.

## 2018-11-11 MED ORDER — PANTOPRAZOLE SODIUM 40 MG PO TBEC: 40.0000 mg | DELAYED_RELEASE_TABLET | Freq: Every day | ORAL | 0 refills | Status: DC

## 2018-11-11 MED ORDER — CYCLOBENZAPRINE HCL 5 MG PO TABS: 5.0000 mg | ORAL_TABLET | Freq: Three times a day (TID) | ORAL | 0 refills | Status: AC | PRN

## 2018-11-11 MED ORDER — PROBIOTIC ACIDOPHILUS PO CAPS: 2.0000 | ORAL_CAPSULE | Freq: Four times a day (QID) | ORAL | 0 refills | Status: AC

## 2018-11-11 MED ORDER — FLUTICASONE PROPIONATE 50 MCG/ACT NA SUSP: 1.0000 | Freq: Every day | NASAL | 2 refills | Status: AC

## 2018-11-11 MED ORDER — DICLOXACILLIN SODIUM 500 MG PO CAPS: 1000.0000 mg | ORAL_CAPSULE | Freq: Four times a day (QID) | ORAL | 0 refills | Status: AC

## 2018-11-11 MED ORDER — ADULT MULTIVITAMIN W/MINERALS CH: 1.0000 | ORAL_TABLET | Freq: Every day | ORAL | 0 refills | Status: DC

## 2018-11-11 NOTE — Plan of Care (Signed)
  Problem: Education: Goal: Knowledge of General Education information will improve Description: Including pain rating scale, medication(s)/side effects and non-pharmacologic comfort measures Outcome: Progressing   Problem: Clinical Measurements: Goal: Ability to maintain clinical measurements within normal limits will improve Outcome: Progressing   Problem: Pain Managment: Goal: General experience of comfort will improve Outcome: Progressing   Problem: Safety: Goal: Ability to remain free from injury will improve Outcome: Progressing   Problem: Skin Integrity: Goal: Risk for impaired skin integrity will decrease Outcome: Progressing   Problem: Clinical Measurements: Goal: Signs and symptoms of infection will decrease Outcome: Progressing

## 2018-11-11 NOTE — TOC Transition Note (Signed)
Transition of Care [facility]) - CM/SW Discharge Note   Patient Details  Name: [name] MRN: [id number] Date of Birth: 1965-12-07  Transition of Care [facility]) CM/SW Contact:  Laylamarie Meuser, Lenice Llamas Phone Number: [phone]  11/11/2018, 12:12 PM   Clinical Narrative: Clinical Social Worker (CW) discussed case with MD and no home health is needed. Patient is back to her baseline with ambulation. Please reconsult if future social work needs arise. CSW signing off.       Final next level of care: Home/Self Care Barriers to Discharge: Barriers Resolved   Patient Goals and CMS Choice Patient states their goals for this hospitalization and ongoing recovery are:: Pt is unable to state goals at this time- pt's sister [name] would like to see her sister get some help for her drug addiction      Discharge Placement                       Discharge Plan and Services   Discharge Planning Services: CM Consult                                 Social Determinants of Health (SDOH) Interventions     Readmission Risk Interventions No flowsheet data found.

## 2018-11-11 NOTE — Progress Notes (Signed)
Pt for discharge home. A/o. No resp distress. Instructions discussed with pt. meds / diet / activity and f/u discussed. Verbalizes understanding.  Sl d/cd.  Out via w/c  Sister to take her home

## 2018-11-11 NOTE — Discharge Summary (Signed)
[facility] NAME: [name]    MR#:  [id number]  DATE OF BIRTH:  18-Jan-1966  DATE OF ADMISSION:  10/13/2018   ADMITTING PHYSICIAN: Loletha Grayer, MD  DATE OF DISCHARGE: 11/11/2018  PRIMARY CARE PHYSICIAN: Marguerita Merles, MD   ADMISSION DIAGNOSIS:  Chronic pneumonia [J18.9] Toe infection [L08.9] DISCHARGE DIAGNOSIS:  Active Problems:   Sepsis ([place])   Substance abuse ([place])  SECONDARY DIAGNOSIS:   Past Medical History:  Diagnosis Date  . Anxiety   . Arthritis    PSORIATIC ARTHRITIS  . Back pain   . Colon polyp   . Depression   . Headache    MIGRAINE  . Ulcer    HOSPITAL COURSE:  Chief complaint; altered mental status   History of presenting complaint; [name]  is a 53 y.o. female who presented with pain all over.  Patient was altered for ER physician but starting to talk a little bit more.  She states she has pain all over.The ER physician did a joint aspiration on the left knee which results are still pending.  The left knee was swollen but not red.  Her left toe is swollen and red.  Patient denies marijuana and crack cocaine use even though that was found in the urine toxicology.  Hospitalist services were contacted for sepsis.   Hospital course; 1.Staph aureus bacteremia, paraspinal abscess, left second toe infection, left knee infection. Patient was on continuous nafcillin infusion which was later changed to cefazolin. TEE negative for endocarditis. Patient completed4 weeks of IV antibiotics through 11/11/2018.  Patient was kept as inpatient and was unable to be sent home previously with IV antibiotics due to history of drug use.Recent elevation in ESR to 106.  Repeat ESR this morning down to 15.  Discussed with infectious disease and recommendation is for 2 more weeks of p.o. dicloxacillin with probiotics.  To follow-up with infectious disease clinic with Dr. Ramon Dredge in 1 week.  Repeat thoracic MRI  showed improved and stable findings.  Patient cleared medically and okay to discharge per infectious disease specialist.  2. Paraspinal abscess. Patient had an IR drainage of the abscess but unable to keep a catheter in. Cx negative. Was on antibiotics prior to drainage of fluid  3. Left knee septic arthritis and left toe infection. Patient had procedure on the toe and also a washout procedure on the left knee. Completed 4 weeks of IV antibiotics.  Being discharged antibiotics as mentioned above.  4. Anxiety depression on Wellbutrin and Xanax  5. GERD on Protonix.  6. Polysubstance abuse. Try to avoid narcotics. Placed on PRN Flexeril 5 mg for muscle spasms.   7. Hypokalemia-replaced previously.   8. Anemia of chronic disease is stable  DISCHARGE CONDITIONS:  Stable CONSULTS OBTAINED:  Treatment Team:  Tsosie Billing, MD Meade Maw, MD DRUG ALLERGIES:   Allergies  Allergen Reactions  . Topiramate Hives   DISCHARGE MEDICATIONS:   Allergies as of 11/11/2018      Reactions   Topiramate Hives      Medication List    STOP taking these medications   FLUoxetine 20 MG capsule Commonly known as: PROZAC   ibuprofen 800 MG tablet Commonly known as: ADVIL   meloxicam 15 MG tablet Commonly known as: MOBIC   methocarbamol 500 MG tablet Commonly known as: ROBAXIN     TAKE these medications   ALPRAZolam 1 MG tablet Commonly known as: XANAX Take 1 mg by mouth 4 (  four) times daily.   amphetamine-dextroamphetamine 30 MG tablet Commonly known as: ADDERALL Take 30 mg by mouth 2 (two) times daily.   buPROPion 200 MG 12 hr tablet Commonly known as: WELLBUTRIN SR TK 1 T PO  QAM AND QD AT NOON   clotrimazole-betamethasone cream Commonly known as: LOTRISONE APP TO AFFECTED EAR ONCE OR TWICE WEEKLY PRN FOR SKIN IRRITATION.   cyclobenzaprine 5 MG tablet Commonly known as: FLEXERIL Take 1 tablet (5 mg total) by mouth 3 (three) times daily as  needed for muscle spasms.   diazepam 5 MG tablet Commonly known as: Valium Take 1 tablet (5 mg total) by mouth every 8 (eight) hours as needed for anxiety.   dicloxacillin 500 MG capsule Commonly known as: DYNAPEN Take 2 capsules (1,000 mg total) by mouth 4 (four) times daily for 14 days.   escitalopram 20 MG tablet Commonly known as: LEXAPRO Take 20 mg by mouth daily.   fluticasone 50 MCG/ACT nasal spray Commonly known as: FLONASE Place 1 spray into both nostrils daily. What changed: how much to take   guanFACINE 2 MG tablet Commonly known as: TENEX Take 2 mg by mouth daily.   multivitamin with minerals Tabs tablet Take 1 tablet by mouth daily.   oxyCODONE-acetaminophen 5-325 MG tablet Commonly known as: PERCOCET/ROXICET Take 1 tablet by mouth every 4 (four) hours as needed for severe pain.   pantoprazole 40 MG tablet Commonly known as: PROTONIX Take 1 tablet (40 mg total) by mouth daily.   Probiotic Acidophilus Caps Take 2 capsules by mouth 4 (four) times daily for 14 days.        DISCHARGE INSTRUCTIONS:   DIET:  Cardiac diet DISCHARGE CONDITION:  Stable ACTIVITY:  Activity as tolerated OXYGEN:  Home Oxygen: No.  Oxygen Delivery: room air DISCHARGE LOCATION:  home   If you experience worsening of your admission symptoms, develop shortness of breath, life threatening emergency, suicidal or homicidal thoughts you must seek medical attention immediately by calling 911 or calling your MD immediately  if symptoms less severe.  You Must read complete instructions/literature along with all the possible adverse reactions/side effects for all the Medicines you take and that have been prescribed to you. Take any new Medicines after you have completely understood and accpet all the possible adverse reactions/side effects.   Please note  You were cared for by a hospitalist during your hospital stay. If you have any questions about your discharge medications or the  care you received while you were in the hospital after you are discharged, you can call the unit and asked to speak with the hospitalist on call if the hospitalist that took care of you is not available. Once you are discharged, your primary care physician will handle any further medical issues. Please note that NO REFILLS for any discharge medications will be authorized once you are discharged, as it is imperative that you return to your primary care physician (or establish a relationship with a primary care physician if you do not have one) for your aftercare needs so that they can reassess your need for medications and monitor your lab values.    On the day of Discharge:  VITAL SIGNS:  Blood pressure 138/90, pulse 90, temperature 97.8 F (36.6 C), temperature source Oral, resp. rate 20, height '5\' 4"'$  (1.626 m), weight 80.7 kg, last menstrual period 01/22/2017, SpO2 99 %. PHYSICAL EXAMINATION:  GENERAL:  53 y.o.-year-old patient lying in the bed with no acute distress.  EYES: Pupils equal, round,  reactive to light and accommodation. No scleral icterus. Extraocular muscles intact.  HEENT: Head atraumatic, normocephalic. Oropharynx and nasopharynx clear.  NECK:  Supple, no jugular venous distention. No thyroid enlargement, no tenderness.  LUNGS: Normal breath sounds bilaterally, no wheezing, rales,rhonchi or crepitation. No use of accessory muscles of respiration.  CARDIOVASCULAR: S1, S2 normal. No murmurs, rubs, or gallops.  ABDOMEN: Soft, non-tender, non-distended. Bowel sounds present. No organomegaly or mass.  EXTREMITIES: No pedal edema, cyanosis, or clubbing.  NEUROLOGIC: Cranial nerves II through XII are intact. Muscle strength 5/5 in all extremities. Sensation intact. Gait not checked.  PSYCHIATRIC: The patient is alert and oriented x 3.  SKIN: No obvious rash, lesion, or ulcer.  DATA REVIEW:   CBC Recent Labs  Lab 11/10/18 0507  WBC 10.0  HGB 9.5*  HCT 30.7*  PLT 798*     Chemistries  Recent Labs  Lab 11/10/18 0507  NA 135  K 3.9  CL 98  CO2 27  GLUCOSE 141*  BUN 11  CREATININE 0.58  CALCIUM 9.1  MG 2.1     Microbiology Results  Results for orders placed or performed during the hospital encounter of 10/13/18  SARS Coronavirus 2 (CEPHEID - Performed in [place] hospital lab), Hosp Order     Status: None   Collection Time: 10/13/18  2:40 PM   Specimen: Nasopharyngeal Swab  Result Value Ref Range Status   SARS Coronavirus 2 NEGATIVE NEGATIVE Final    Comment: (NOTE) If result is NEGATIVE SARS-CoV-2 target nucleic acids are NOT DETECTED. The SARS-CoV-2 RNA is generally detectable in upper and lower  respiratory specimens during the acute phase of infection. The lowest  concentration of SARS-CoV-2 viral copies this assay can detect is 250  copies / mL. A negative result does not preclude SARS-CoV-2 infection  and should not be used as the sole basis for treatment or other  patient management decisions.  A negative result may occur with  improper specimen collection / handling, submission of specimen other  than nasopharyngeal swab, presence of viral mutation(s) within the  areas targeted by this assay, and inadequate number of viral copies  (<250 copies / mL). A negative result must be combined with clinical  observations, patient history, and epidemiological information. If result is POSITIVE SARS-CoV-2 target nucleic acids are DETECTED. The SARS-CoV-2 RNA is generally detectable in upper and lower  respiratory specimens dur ing the acute phase of infection.  Positive  results are indicative of active infection with SARS-CoV-2.  Clinical  correlation with patient history and other diagnostic information is  necessary to determine patient infection status.  Positive results do  not rule out bacterial infection or co-infection with other viruses. If result is PRESUMPTIVE POSTIVE SARS-CoV-2 nucleic acids MAY BE PRESENT.   A presumptive  positive result was obtained on the submitted specimen  and confirmed on repeat testing.  While 2019 novel coronavirus  (SARS-CoV-2) nucleic acids may be present in the submitted sample  additional confirmatory testing may be necessary for epidemiological  and / or clinical management purposes  to differentiate between  SARS-CoV-2 and other Sarbecovirus currently known to infect humans.  If clinically indicated additional testing with an alternate test  methodology [id number]) is advised. The SARS-CoV-2 RNA is generally  detectable in upper and lower respiratory sp ecimens during the acute  phase of infection. The expected result is Negative. Fact Sheet for Patients:  StrictlyIdeas.no Fact Sheet for Healthcare Providers: BankingDealers.co.za This test is not yet approved or cleared by the  [place] Architectural technologist and has been authorized for detection and/or diagnosis of SARS-CoV-2 by FDA under an Print production planner (EUA).  This EUA will remain in effect (meaning this test can be used) for the duration of the COVID-19 declaration under Section 564(b)(1) of the Act, 21 U.S.C. section 360bbb-3(b)(1), unless the authorization is terminated or revoked sooner. Performed at [facility], [address], [place], [place] [address]   Body fluid culture     Status: None   Collection Time: 10/13/18  2:48 PM   Specimen: Synovium; Body Fluid  Result Value Ref Range Status   Specimen Description   Final    SYNOVIAL FLUID Performed at [facility], [address] [place], [place] [address]    Special Requests   Final    NONE Performed at [facility], [address] [place], [place] [address]    Gram Stain   Final    ABUNDANT WBC PRESENT, PREDOMINANTLY PMN NO ORGANISMS SEEN    Culture   Final    RARE STAPHYLOCOCCUS AUREUS RESULT CALLED TO, READ BACK BY AND VERIFIED WITH: SMITH RN AT 0800 ON [id number] BY SJW Performed  at [facility], [place] [address], [place], [place] [address]    Report Status 10/16/2018 FINAL  Final   Organism ID, Bacteria STAPHYLOCOCCUS AUREUS  Final      Susceptibility   Staphylococcus aureus - MIC*    CIPROFLOXACIN <=0.5 SENSITIVE Sensitive     ERYTHROMYCIN <=0.25 SENSITIVE Sensitive     GENTAMICIN <=0.5 SENSITIVE Sensitive     OXACILLIN 0.5 SENSITIVE Sensitive     TETRACYCLINE <=1 SENSITIVE Sensitive     VANCOMYCIN 1 SENSITIVE Sensitive     TRIMETH/SULFA <=10 SENSITIVE Sensitive     CLINDAMYCIN <=0.25 SENSITIVE Sensitive     RIFAMPIN <=0.5 SENSITIVE Sensitive     Inducible Clindamycin NEGATIVE Sensitive     * RARE STAPHYLOCOCCUS AUREUS  Urine culture     Status: Abnormal   Collection Time: 10/13/18  2:48 PM   Specimen: Urine, Random  Result Value Ref Range Status   Specimen Description   Final    URINE, RANDOM Performed at [facility], [address] [place], [place] [address]    Special Requests   Final    NONE Performed at [facility], [address] [place], [place] [address]    Culture >=100,000 COLONIES/mL STAPHYLOCOCCUS AUREUS (A)  Final   Report Status 10/15/2018 FINAL  Final   Organism ID, Bacteria STAPHYLOCOCCUS AUREUS (A)  Final      Susceptibility   Staphylococcus aureus - MIC*    CIPROFLOXACIN <=0.5 SENSITIVE Sensitive     GENTAMICIN <=0.5 SENSITIVE Sensitive     NITROFURANTOIN <=16 SENSITIVE Sensitive     OXACILLIN <=0.25 SENSITIVE Sensitive     TETRACYCLINE <=1 SENSITIVE Sensitive     VANCOMYCIN 1 SENSITIVE Sensitive     TRIMETH/SULFA <=10 SENSITIVE Sensitive     CLINDAMYCIN <=0.25 SENSITIVE Sensitive     RIFAMPIN <=0.5 SENSITIVE Sensitive     Inducible Clindamycin NEGATIVE Sensitive     * >=100,000 COLONIES/mL STAPHYLOCOCCUS AUREUS  Blood culture (routine x 2)     Status: Abnormal   Collection Time: 10/13/18  3:32 PM   Specimen: BLOOD LEFT WRIST  Result Value Ref Range Status   Specimen Description   Final    BLOOD  LEFT WRIST Performed at [facility], [place] [address], [place], [place] [address]    Special Requests  Final    BOTTLES DRAWN AEROBIC AND ANAEROBIC Blood Culture results may not be optimal due to an inadequate volume of blood received in culture bottles Performed at [facility], [address] [place], [place] [address]    Culture  Setup Time   Final    GRAM POSITIVE COCCI AEROBIC BOTTLE ONLY CRITICAL VALUE NOTED.  VALUE IS CONSISTENT WITH PREVIOUSLY REPORTED AND CALLED VALUE. Performed at [facility], [address] [place], [place] [address]    Culture (A)  Final    STAPHYLOCOCCUS AUREUS SUSCEPTIBILITIES PERFORMED ON PREVIOUS CULTURE WITHIN THE LAST 5 DAYS. Performed at [facility], [place] [address] [place], [place] [address]    Report Status 10/15/2018 FINAL  Final  Blood culture (routine x 2)     Status: Abnormal   Collection Time: 10/13/18  3:33 PM   Specimen: BLOOD RIGHT WRIST  Result Value Ref Range Status   Specimen Description   Final    BLOOD RIGHT WRIST Performed at [place] [address], [place], [place] [address]    Special Requests   Final    BOTTLES DRAWN AEROBIC AND ANAEROBIC Blood Culture results may not be optimal due to an inadequate volume of blood received in culture bottles Performed at [facility], [address] [place], [place] [address]    Culture  Setup Time   Final    GRAM POSITIVE COCCI AEROBIC BOTTLE ONLY CRITICAL RESULT CALLED TO, READ BACK BY AND VERIFIED WITH: WALID NAZARI _0  10/14/18 AKT    Culture (A)  Final    STAPHYLOCOCCUS AUREUS SUSCEPTIBILITIES PERFORMED ON PREVIOUS CULTURE WITHIN THE LAST 5 DAYS. Performed at [facility], [place] [address], [place], [place] [address]    Report Status 10/15/2018 FINAL  Final  Blood Culture ID Panel (Reflexed)     Status: Abnormal   Collection Time: 10/13/18  3:33 PM  Result Value Ref Range Status   Enterococcus species NOT DETECTED  NOT DETECTED Final   Listeria monocytogenes NOT DETECTED NOT DETECTED Final   Staphylococcus species DETECTED (A) NOT DETECTED Final    Comment: CRITICAL RESULT CALLED TO, READ BACK BY AND VERIFIED WITH: WALID NAZARI _1  10/14/18 AKT    Staphylococcus aureus (BCID) DETECTED (A) NOT DETECTED Final    Comment: Methicillin (oxacillin) susceptible Staphylococcus aureus (MSSA). Preferred therapy is anti staphylococcal beta lactam antibiotic (Cefazolin or Nafcillin), unless clinically contraindicated. CRITICAL RESULT CALLED TO, READ BACK BY AND VERIFIED WITH: WALID NAZARI _2  10/14/18 AKT    Methicillin resistance NOT DETECTED NOT DETECTED Final   Streptococcus species NOT DETECTED NOT DETECTED Final   Streptococcus agalactiae NOT DETECTED NOT DETECTED Final   Streptococcus pneumoniae NOT DETECTED NOT DETECTED Final   Streptococcus pyogenes NOT DETECTED NOT DETECTED Final   Acinetobacter baumannii NOT DETECTED NOT DETECTED Final   Enterobacteriaceae species NOT DETECTED NOT DETECTED Final   Enterobacter cloacae complex NOT DETECTED NOT DETECTED Final   Escherichia coli NOT DETECTED NOT DETECTED Final   Klebsiella oxytoca NOT DETECTED NOT DETECTED Final   Klebsiella pneumoniae NOT DETECTED NOT DETECTED Final   Proteus species NOT DETECTED NOT DETECTED Final   Serratia marcescens NOT DETECTED NOT DETECTED Final   Haemophilus influenzae NOT DETECTED NOT DETECTED Final   Neisseria meningitidis NOT DETECTED NOT DETECTED Final   Pseudomonas aeruginosa NOT DETECTED NOT DETECTED Final   Candida albicans NOT DETECTED NOT DETECTED Final   Candida glabrata NOT DETECTED NOT DETECTED Final   Candida krusei  NOT DETECTED NOT DETECTED Final   Candida parapsilosis NOT DETECTED NOT DETECTED Final   Candida tropicalis NOT DETECTED NOT DETECTED Final    Comment: Performed at [facility], [address] [place], [place] [address]  Aerobic/Anaerobic Culture (surgical/deep wound)     Status: None    Collection Time: 10/14/18  1:25 PM   Specimen: Foot; Abscess  Result Value Ref Range Status   Specimen Description   Final    FOOT LEFT Performed at [facility], [address], [place], [place] [address]    Special Requests   Final    NONE Performed at [facility], [address] [place], [place] [address]    Gram Stain   Final    RARE WBC PRESENT, PREDOMINANTLY PMN FEW GRAM POSITIVE COCCI    Culture   Final    MODERATE STAPHYLOCOCCUS AUREUS NO ANAEROBES ISOLATED Performed at [facility], [place] [address] [place], [place] [address]    Report Status 10/19/2018 FINAL  Final   Organism ID, Bacteria STAPHYLOCOCCUS AUREUS  Final      Susceptibility   Staphylococcus aureus - MIC*    CIPROFLOXACIN <=0.5 SENSITIVE Sensitive     ERYTHROMYCIN <=0.25 SENSITIVE Sensitive     GENTAMICIN <=0.5 SENSITIVE Sensitive     OXACILLIN <=0.25 SENSITIVE Sensitive     TETRACYCLINE <=1 SENSITIVE Sensitive     VANCOMYCIN <=0.5 SENSITIVE Sensitive     TRIMETH/SULFA <=10 SENSITIVE Sensitive     CLINDAMYCIN <=0.25 SENSITIVE Sensitive     RIFAMPIN <=0.5 SENSITIVE Sensitive     Inducible Clindamycin NEGATIVE Sensitive     * MODERATE STAPHYLOCOCCUS AUREUS  CULTURE, BLOOD (ROUTINE X 2) w Reflex to ID Panel     Status: None   Collection Time: 10/16/18 12:03 AM   Specimen: BLOOD  Result Value Ref Range Status   Specimen Description BLOOD RIGHT ASSIST CONTROL  Final   Special Requests   Final    BOTTLES DRAWN AEROBIC AND ANAEROBIC Blood Culture adequate volume   Culture   Final    NO GROWTH 5 DAYS Performed at [facility], [address] [place], [place] [address]    Report Status 10/21/2018 FINAL  Final  CULTURE, BLOOD (ROUTINE X 2) w Reflex to ID Panel     Status: None   Collection Time: 10/16/18 12:10 AM   Specimen: BLOOD  Result Value Ref Range Status   Specimen Description BLOOD LEFT ASSIST CONTROL  Final   Special Requests   Final    BOTTLES DRAWN  AEROBIC AND ANAEROBIC Blood Culture adequate volume   Culture   Final    NO GROWTH 5 DAYS Performed at [facility], [address] [place], [place] [address]    Report Status 10/21/2018 FINAL  Final  Aerobic/Anaerobic Culture (surgical/deep wound)     Status: None   Collection Time: 10/22/18 12:35 PM   Specimen: Back; Abscess  Result Value Ref Range Status   Specimen Description   Final    BACK Performed at [facility], [address] [place], [place] [address]    Special Requests   Final    NONE Performed at [facility], [place], [place] [address]    Gram Stain NO WBC SEEN NO ORGANISMS SEEN   Final   Culture   Final    No growth aerobically or anaerobically. Performed at [facility], [place] [address], [place], [place] [address]    Report  Status 10/27/2018 FINAL  Final  MRSA PCR Screening     Status: None   Collection Time: 10/24/18  4:17 PM   Specimen: Nasal Mucosa; Nasopharyngeal  Result Value Ref Range Status   MRSA by PCR NEGATIVE NEGATIVE Final    Comment:        The GeneXpert MRSA Assay (FDA approved for NASAL specimens only), is one component of a comprehensive MRSA colonization surveillance program. It is not intended to diagnose MRSA infection nor to guide or monitor treatment for MRSA infections. Performed at [facility], [address] [place], [place] [address]     RADIOLOGY:  No results found.   Management plans discussed with the patient, family and they are in agreement.  CODE STATUS: Full Code   TOTAL TIME TAKING CARE OF THIS PATIENT: 39 minutes.    Naiyana Barbian M.D on 11/11/2018 at 10:56 AM  Between 7am to 6pm - Pager - [phone]  After 6pm go to [email or web address] - Proofreader  [facility]  Office  [phone]  CC: Primary care physician; Marguerita Merles, MD   Note: This dictation was prepared with Dragon dictation along with smaller  phrase technology. Any transcriptional errors that result from this process are unintentional.

## 2018-11-19 ENCOUNTER — Other Ambulatory Visit: Payer: Self-pay

## 2018-11-19 ENCOUNTER — Encounter: Payer: Self-pay | Admitting: Infectious Diseases

## 2018-11-19 ENCOUNTER — Ambulatory Visit: Payer: Medicaid Other | Attending: Infectious Diseases | Admitting: Infectious Diseases

## 2018-11-19 VITALS — BP 105/69 | HR 86 | Temp 98.0°F | Ht 63.0 in | Wt 164.0 lb

## 2018-11-19 DIAGNOSIS — A4901 Methicillin susceptible Staphylococcus aureus infection, unspecified site: Secondary | ICD-10-CM

## 2018-11-19 DIAGNOSIS — Z87891 Personal history of nicotine dependence: Secondary | ICD-10-CM

## 2018-11-19 DIAGNOSIS — M4644 Discitis, unspecified, thoracic region: Secondary | ICD-10-CM | POA: Diagnosis not present

## 2018-11-19 DIAGNOSIS — M4620 Osteomyelitis of vertebra, site unspecified: Secondary | ICD-10-CM | POA: Insufficient documentation

## 2018-11-19 DIAGNOSIS — L089 Local infection of the skin and subcutaneous tissue, unspecified: Secondary | ICD-10-CM | POA: Diagnosis not present

## 2018-11-19 DIAGNOSIS — R7881 Bacteremia: Secondary | ICD-10-CM | POA: Diagnosis not present

## 2018-11-19 DIAGNOSIS — Z792 Long term (current) use of antibiotics: Secondary | ICD-10-CM

## 2018-11-19 DIAGNOSIS — F1490 Cocaine use, unspecified, uncomplicated: Secondary | ICD-10-CM

## 2018-11-19 DIAGNOSIS — B9561 Methicillin susceptible Staphylococcus aureus infection as the cause of diseases classified elsewhere: Secondary | ICD-10-CM

## 2018-11-19 DIAGNOSIS — M8608 Acute hematogenous osteomyelitis, other sites: Secondary | ICD-10-CM

## 2018-11-19 DIAGNOSIS — M4624 Osteomyelitis of vertebra, thoracic region: Secondary | ICD-10-CM

## 2018-11-19 DIAGNOSIS — M00062 Staphylococcal arthritis, left knee: Secondary | ICD-10-CM

## 2018-11-19 DIAGNOSIS — L409 Psoriasis, unspecified: Secondary | ICD-10-CM

## 2018-11-19 NOTE — Progress Notes (Signed)
NAME: [name]  DOB: March 30, 1966  MRN: [id number]  Date/Time: 11/19/2018 10:32 AM  Subjective:  Follow up after recent hospitalization.. ? [name] is a 53 y.o. female was in [facility] between 10/13/2018 until 11/11/2018.  For extensive staph aureus infection which included bacteremia, left knee septic arthritis, left third toe infection, thoracic vertebra discitis as well as paraspinal abscess at the level ofT10-T11. During her stay she underwent left knee arthrocentesis, third toe drainage, paraspinal abscess by IR. She was initially on IV nafcillin was on IV cefazolin and she completed 4 weeks of IV antibiotics.  Once her ESR had normalized she was changed to p.o. doxycycline to complete 6 to 8 weeks of treatment and was discharged home.  She is here for a follow-up.  She is doing much better.  She does not have any fever or chills.  The left knee does not hurt.  There is some back pain especially lower thoracic area.  This is better than before.  She has been taking her dicloxacillin 1 g every 6 hours since discharge.  She does not have any diarrhea. She has a history of cocaine use and says she did not do any since discharge.  Past medical history TMJ Depression Psoriasis Psoriatic arthritis had been on tumor necrosis factor inhibitor in the past Breast abscess due to staph aureus in 2015  Past surgical history I/D of breast abscess Jaw alignment surgery following fracture Right foot surgery Breast reduction surgery    Social History   Socioeconomic History  . Marital status: Single    Spouse name: Not on file  . Number of children: Not on file  . Years of education: Not on file  . Highest education level: Not on file  Occupational History  . Not on file  Social Needs  . Financial resource strain: Not on file  . Food insecurity    Worry: Not on file    Inability: Not on file  . Transportation needs    Medical: Not on file    Non-medical: Not on file  Tobacco Use  .  Smoking status: Former Smoker    Types: Cigarettes  . Smokeless tobacco: Never Used  Substance and Sexual Activity  . Alcohol use: Never    Frequency: Never  . Drug use: Yes    Types: "Crack" cocaine, Marijuana  . Sexual activity: Not on file  Lifestyle  . Physical activity    Days per week: Not on file    Minutes per session: Not on file  . Stress: Not on file  Relationships  . Social Herbalist on phone: Not on file    Gets together: Not on file    Attends religious service: Not on file    Active member of club or organization: Not on file    Attends meetings of clubs or organizations: Not on file    Relationship status: Not on file  . Intimate partner violence    Fear of current or ex partner: Not on file    Emotionally abused: Not on file    Physically abused: Not on file    Forced sexual activity: Not on file  Other Topics Concern  . Not on file  Social History Narrative  . Not on file    Family History  Problem Relation Age of Onset  . Liver cancer Mother   . Liver disease Mother   . Cirrhosis Mother   . Alcohol abuse Mother   . Cancer Father   .  Colon cancer Maternal Aunt    No Active Allergies  ? Current Outpatient Medications  Medication Sig Dispense Refill  . ALPRAZolam (XANAX) 1 MG tablet Take 1 mg by mouth 4 (four) times daily.    Marland Kitchen amphetamine-dextroamphetamine (ADDERALL) 30 MG tablet Take 30 mg by mouth 2 (two) times daily.     Marland Kitchen buPROPion (WELLBUTRIN SR) 200 MG 12 hr tablet TK 1 T PO  QAM AND QD AT NOON    . clotrimazole-betamethasone (LOTRISONE) cream APP TO AFFECTED EAR ONCE OR TWICE WEEKLY PRN FOR SKIN IRRITATION.  6  . cyclobenzaprine (FLEXERIL) 5 MG tablet Take 1 tablet (5 mg total) by mouth 3 (three) times daily as needed for muscle spasms. 30 tablet 0  . diazepam (VALIUM) 5 MG tablet Take 1 tablet (5 mg total) by mouth every 8 (eight) hours as needed for anxiety. 15 tablet 0  . dicloxacillin (DYNAPEN) 500 MG capsule Take 2 capsules  (1,000 mg total) by mouth 4 (four) times daily for 14 days. 112 capsule 0  . escitalopram (LEXAPRO) 20 MG tablet Take 20 mg by mouth daily.     . fluticasone (FLONASE) 50 MCG/ACT nasal spray Place 1 spray into both nostrils daily. 1 g 2  . guanFACINE (TENEX) 2 MG tablet Take 2 mg by mouth daily.    . Lactobacillus (PROBIOTIC ACIDOPHILUS) CAPS Take 2 capsules by mouth 4 (four) times daily for 14 days. 112 capsule 0  . Multiple Vitamin (MULTIVITAMIN WITH MINERALS) TABS tablet Take 1 tablet by mouth daily. 30 tablet 0  . oxyCODONE-acetaminophen (PERCOCET/ROXICET) 5-325 MG tablet Take 1 tablet by mouth every 4 (four) hours as needed for severe pain. 15 tablet 0  . pantoprazole (PROTONIX) 40 MG tablet Take 1 tablet (40 mg total) by mouth daily. 30 tablet 0   No current facility-administered medications for this visit.      Abtx:  Anti-infectives (From admission, onward)   None      REVIEW OF SYSTEMS:  Const: negative fever, negative chills, negative weight loss Eyes: negative diplopia or visual changes, negative eye pain ENT: negative coryza, negative sore throat Resp: negative cough, hemoptysis, dyspnea Cards: negative for chest pain, palpitations, lower extremity edema GU: negative for frequency, dysuria and hematuria GI: Negative for abdominal pain, diarrhea, bleeding, constipation Skin: negative for rash and pruritus Heme: negative for easy bruising and gum/nose bleeding MS: Weakness and mid back pain Neurolo:negative for headaches, dizziness, vertigo, memory problems  Psych: negative for feelings of anxiety, depression  Endocrine: negative for thyroid, diabetes Allergy/Immunology- negative for any medication or food allergies ? Objective:  VITALS:  BP 105/69 (BP Location: Left Arm, Patient Position: Sitting, Cuff Size: Normal)   Pulse 86   Temp 98 F (36.7 C) (Oral)   Ht '5\' 3"'  (1.6 m)   Wt 164 lb (74.4 kg)   LMP 01/22/2017 (LMP Unknown) Comment: no period in a year   BMI  29.05 kg/m  PHYSICAL EXAM:  General: Alert, cooperative, no distress, appears stated age.  Head: Normocephalic, without obvious abnormality, atraumatic. Eyes: Conjunctivae clear, anicteric sclerae. Pupils are equal ENT Nares normal. No drainage or sinus tenderness. Lips, mucosa, and tongue normal. No Thrush Neck: Supple, symmetrical, no adenopathy, thyroid: non tender no carotid bruit and no JVD. Back: No CVA tenderness. Lungs: Clear to auscultation bilaterally. No Wheezing or Rhonchi. No rales. Heart: Regular rate and rhythm, no murmur, rub or gallop. Abdomen: Soft, non-tender,not distended. Bowel sounds normal. No masses Extremities: Left third toe minimal erythema.  Closed  wound at the site of the incision and drainage.   No swelling of the left knee or tenderness or erythema. Skin: No rashes or lesions. Or bruising Lymph: Cervical, supraclavicular normal. Neurologic: Grossly non-focal Pertinent Labs Lab Results CBC    Component Value Date/Time   WBC 10.0 11/10/2018 0507   RBC 3.21 (L) 11/10/2018 0507   HGB 9.5 (L) 11/10/2018 0507   HGB 12.8 06/02/2011 1038   HCT 30.7 (L) 11/10/2018 0507   HCT 38.5 06/02/2011 1038   PLT 798 (H) 11/10/2018 0507   PLT 344 06/02/2011 1038   MCV 95.6 11/10/2018 0507   MCV 99 06/02/2011 1038   MCH 29.6 11/10/2018 0507   MCHC 30.9 11/10/2018 0507   RDW 14.1 11/10/2018 0507   RDW 13.2 06/02/2011 1038   LYMPHSABS 2.5 11/04/2018 1146   LYMPHSABS 3.7 (H) 06/02/2011 1038   MONOABS 1.0 11/04/2018 1146   MONOABS 0.9 (H) 06/02/2011 1038   EOSABS 0.8 (H) 11/04/2018 1146   EOSABS 1.0 (H) 06/02/2011 1038   BASOSABS 0.1 11/04/2018 1146   BASOSABS 0.1 06/02/2011 1038    CMP Latest Ref Rng & Units 11/10/2018 11/08/2018 11/06/2018  Glucose 70 - 99 mg/dL 141(H) 105(H) 142(H)  BUN 6 - 20 mg/dL '11 12 13  ' Creatinine 0.44 - 1.00 mg/dL 0.58 0.67 0.59  Sodium 135 - 145 mmol/L 135 137 138  Potassium 3.5 - 5.1 mmol/L 3.9 5.0 4.3  Chloride 98 - 111 mmol/L 98  99 102  CO2 22 - 32 mmol/L '27 26 25  ' Calcium 8.9 - 10.3 mg/dL 9.1 9.6 9.3  Total Protein 6.5 - 8.1 g/dL - - -  Total Bilirubin 0.3 - 1.2 mg/dL - - -  Alkaline Phos 38 - 126 U/L - - -  AST 15 - 41 U/L - - -  ALT 0 - 44 U/L - - -     ? Impression/Recommendation ? 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 Had MSSA bacteremia along with left septic arthritis, left third toe infection which have all resolved.  She also had paraspinal abscess and T10-T11 vertebral, facet joint infection which is also improved from a repeat MRI done on 11/05/2018.  Her last ESR was normal.  While in the hospital she received 4 weeks of IV antibiotics and was discharged on p.o.dicloxacillin to  to complete 6 weeks of total treatment.  Today she will get labs which includes ESR, CRP CBC and CMP.  Depending on the results we will decide whether to continue dicloxacillin beyond 6 weeks for another 2 weeks. ? ___Discussed with patient in great detail We will call her with the lab results. She may not need follow-up appointment if the labs are normal. Note:  This document was prepared using Dragon voice recognition software and may include unintentional dictation errors.

## 2018-11-19 NOTE — Patient Instructions (Addendum)
You are here for follow up after recent hospital discharge-on 11/11/18- you are on dicloxacillin 1 gram every 6 hours for 2 weeks- today will check labs and depending on that you may need 2 more weeks of dicloxacillin. Will let you know once labs are available.

## 2018-11-27 ENCOUNTER — Telehealth: Payer: Self-pay | Admitting: Licensed Clinical Social Worker

## 2018-11-27 ENCOUNTER — Other Ambulatory Visit: Payer: Self-pay | Admitting: Infectious Diseases

## 2018-11-27 MED ORDER — DICLOXACILLIN SODIUM 500 MG PO CAPS: 500.0000 mg | ORAL_CAPSULE | Freq: Four times a day (QID) | ORAL | 1 refills | Status: DC

## 2018-11-27 NOTE — Telephone Encounter (Signed)
Labs ordered by Dr. Delaine Lame were drawn at the patient's PCP last week. Patient called stating that the results were still elevated and wanted to know what to do about her antibiotics. Spoke with Dr, Delaine Lame she will review the labs make necessary adjustments or changes to medication.

## 2018-11-27 NOTE — Telephone Encounter (Signed)
I She will continue with dicolxacilin 558m every 6 hours for 2 more weeks  With repeat ESR/CRP ( regular not HS) - need to make sure that she is taking the medication as scheduled and not missing any doses- will need to see the results done at PCP .

## 2018-11-28 NOTE — Telephone Encounter (Signed)
I called the patient 2 times and left her a message for her to give me a call.

## 2018-11-28 NOTE — Telephone Encounter (Signed)
I will call her again tomorrow

## 2018-12-11 ENCOUNTER — Other Ambulatory Visit: Payer: Self-pay | Admitting: Infectious Diseases

## 2018-12-12 ENCOUNTER — Other Ambulatory Visit: Payer: Self-pay | Admitting: Infectious Diseases

## 2018-12-12 ENCOUNTER — Telehealth: Payer: Self-pay | Admitting: Infectious Diseases

## 2018-12-12 DIAGNOSIS — A4901 Methicillin susceptible Staphylococcus aureus infection, unspecified site: Secondary | ICD-10-CM

## 2018-12-12 NOTE — Telephone Encounter (Signed)
Having troubles with knee  Knee is swollen and patient is stating knee is giving out, can't walk on it...  Wanting to know if we can get referral  Primary Care refused to do referral for Orthopedic  Do you want me to schedule to come here Thursday?  Thanks!  Hunter

## 2018-12-12 NOTE — Telephone Encounter (Signed)
Dr. Delaine Lame advised patient to have lab work done, lab orders are in the computer for her to come in when she is ready.

## 2018-12-22 ENCOUNTER — Other Ambulatory Visit
Admission: RE | Admit: 2018-12-22 | Discharge: 2018-12-22 | Disposition: A | Discharge status: Home / Self Care | Payer: Medicaid Other | Attending: Infectious Diseases | Admitting: Infectious Diseases

## 2018-12-22 DIAGNOSIS — A4901 Methicillin susceptible Staphylococcus aureus infection, unspecified site: Secondary | ICD-10-CM | POA: Insufficient documentation

## 2018-12-22 LAB — COMPREHENSIVE METABOLIC PANEL
ALT: 12 U/L (ref 0–44)
AST: 18 U/L (ref 15–41)
Albumin: 3.3 g/dL — ABNORMAL LOW (ref 3.5–5.0)
Alkaline Phosphatase: 110 U/L (ref 38–126)
Anion gap: 9 (ref 5–15)
BUN: 11 mg/dL (ref 6–20)
CO2: 27 mmol/L (ref 22–32)
Calcium: 9.4 mg/dL (ref 8.9–10.3)
Chloride: 106 mmol/L (ref 98–111)
Creatinine, Ser: 0.64 mg/dL (ref 0.44–1.00)
GFR calc Af Amer: >60 mL/min (ref >60)
GFR calc non Af Amer: >60 mL/min (ref >60)
Glucose, Bld: 104 mg/dL — ABNORMAL HIGH (ref 70–99)
Potassium: 4.2 mmol/L (ref 3.5–5.1)
Sodium: 142 mmol/L (ref 135–145)
Total Bilirubin: 0.3 mg/dL (ref 0.3–1.2)
Total Protein: 7.6 g/dL (ref 6.5–8.1)

## 2018-12-22 LAB — CBC WITH DIFFERENTIAL/PLATELET
Abs Immature Granulocytes: 0.02 10*3/uL (ref 0.00–0.07)
Basophils Absolute: 0.1 10*3/uL (ref 0.0–0.1)
Basophils Relative: 1 %
Eosinophils Absolute: 0.6 10*3/uL — ABNORMAL HIGH (ref 0.0–0.5)
Eosinophils Relative: 8 %
HCT: 34.9 % — ABNORMAL LOW (ref 36.0–46.0)
Hemoglobin: 10.7 g/dL — ABNORMAL LOW (ref 12.0–15.0)
Immature Granulocytes: 0 %
Lymphocytes Relative: 29 %
Lymphs Abs: 2.1 10*3/uL (ref 0.7–4.0)
MCH: 28.5 pg (ref 26.0–34.0)
MCHC: 30.7 g/dL (ref 30.0–36.0)
MCV: 93.1 fL (ref 80.0–100.0)
Monocytes Absolute: 0.6 10*3/uL (ref 0.1–1.0)
Monocytes Relative: 9 %
Neutro Abs: 3.9 10*3/uL (ref 1.7–7.7)
Neutrophils Relative %: 53 %
Platelets: 445 10*3/uL — ABNORMAL HIGH (ref 150–400)
RBC: 3.75 MIL/uL — ABNORMAL LOW (ref 3.87–5.11)
RDW: 14.8 % (ref 11.5–15.5)
WBC: 7.3 10*3/uL (ref 4.0–10.5)
nRBC: 0 % (ref 0.0–0.2)

## 2018-12-22 LAB — C-REACTIVE PROTEIN: CRP: 4.1 mg/dL — ABNORMAL HIGH (ref <1.0)

## 2018-12-22 LAB — SEDIMENTATION RATE: Sed Rate: 61 mm/hr — ABNORMAL HIGH (ref 0–30)

## 2018-12-24 ENCOUNTER — Encounter: Payer: Self-pay | Admitting: Infectious Diseases

## 2018-12-24 ENCOUNTER — Ambulatory Visit: Payer: Medicaid Other | Attending: Infectious Diseases | Admitting: Infectious Diseases

## 2018-12-24 ENCOUNTER — Other Ambulatory Visit: Payer: Self-pay

## 2018-12-24 VITALS — BP 102/64 | HR 73 | Temp 97.9°F | Ht 63.0 in | Wt 173.5 lb

## 2018-12-24 DIAGNOSIS — F419 Anxiety disorder, unspecified: Secondary | ICD-10-CM

## 2018-12-24 DIAGNOSIS — Z79899 Other long term (current) drug therapy: Secondary | ICD-10-CM

## 2018-12-24 DIAGNOSIS — M4644 Discitis, unspecified, thoracic region: Secondary | ICD-10-CM

## 2018-12-24 DIAGNOSIS — M00062 Staphylococcal arthritis, left knee: Secondary | ICD-10-CM | POA: Diagnosis not present

## 2018-12-24 DIAGNOSIS — G061 Intraspinal abscess and granuloma: Secondary | ICD-10-CM

## 2018-12-24 DIAGNOSIS — R7881 Bacteremia: Secondary | ICD-10-CM | POA: Diagnosis not present

## 2018-12-24 DIAGNOSIS — L089 Local infection of the skin and subcutaneous tissue, unspecified: Secondary | ICD-10-CM

## 2018-12-24 DIAGNOSIS — F17210 Nicotine dependence, cigarettes, uncomplicated: Secondary | ICD-10-CM

## 2018-12-24 DIAGNOSIS — B9561 Methicillin susceptible Staphylococcus aureus infection as the cause of diseases classified elsewhere: Secondary | ICD-10-CM

## 2018-12-24 DIAGNOSIS — F329 Major depressive disorder, single episode, unspecified: Secondary | ICD-10-CM

## 2018-12-24 DIAGNOSIS — L4050 Arthropathic psoriasis, unspecified: Secondary | ICD-10-CM

## 2018-12-24 NOTE — Patient Instructions (Addendum)
You are here to see me because of worsening left knee pain- you have been treated for widespread MSSA infection with 9 weeks of antibiotics which you completed on 12/20/18. Would recommend that you see Dr.Bowers and if there is fluid he can do test on it to see whether it is still infection or arthritis ( could be psoriatic arthritis or osteo) . We will decide on further management following that. Your blood work  have improved but not come back to normal . We will repeat next week to see the trend

## 2018-12-24 NOTE — Progress Notes (Signed)
NAME: [name]  DOB: [birth year]  MRN: [id number]  Date/Time: 12/24/2018 8:51 AM  REQUESTING PROVIDER Subjective:  REASON FOR CONSULT:  [name] is a 53 y.o. female was in [facility] between 10/13/2018 until 11/11/2018.  For extensive staph aureus infection which included bacteremia, left knee septic arthritis, left third toe infection, thoracic vertebra discitis as well as paraspinal abscess at the level ofT10-T11. During her stay she underwent left knee arthrocentesis, third toe drainage, paraspinal abscess by IR. She was initially on IV nafcillin and then on  IV cefazolin and she completed 4 weeks of IV antibiotics.  Once her ESR had normalized she was changed to p.o. dicloxacillin ( not doxy as mentioned in my previous note)  to complete 6 to 8 weeks of treatment and was discharged home on 11/11/2018.  I saw her as a follow-up on 11/11/2018 and she was doing well.  Her back pain was present but her knee was not bothering her.  She continues to take dicloxacillin and finish another 4 weeks on 12/12/2018.  Her ESR which had normalized in the hospital to 15 has gone up to 69.  CRP has dropped from 26-4.  She is complaining of severe pain in the left knee.  She is unable to walk well.  She does not have any fever.  She does not have any chills, nausea or vomiting.  She had called orthopedic office and they asked her to be seen by me first.  She has a history of psoriatic arthritis and used to be on tumor necrosis factor inhibitor Humira many years ago she states.      Past Medical History:  Diagnosis Date  . Anxiety   . Arthritis    PSORIATIC ARTHRITIS  . Back pain   . Colon polyp   . Depression   . Headache    MIGRAINE  . Ulcer     Past Surgical History:  Procedure Laterality Date  . BREAST REDUCTION SURGERY    . COLONOSCOPY WITH ESOPHAGOGASTRODUODENOSCOPY (EGD)    . COLONOSCOPY WITH PROPOFOL N/A 05/19/2016   Procedure: COLONOSCOPY WITH PROPOFOL;  Surgeon: Lollie Sails, MD;  Location: [facility]  ENDOSCOPY;  Service: Endoscopy;  Laterality: N/A;  . COLONOSCOPY WITH PROPOFOL N/A 03/06/2018   Procedure: COLONOSCOPY WITH PROPOFOL;  Surgeon: Toledo, Benay Pike, MD;  Location: [facility] ENDOSCOPY;  Service: Gastroenterology;  Laterality: N/A;  . ESOPHAGOGASTRODUODENOSCOPY (EGD) WITH PROPOFOL N/A 03/06/2018   Procedure: ESOPHAGOGASTRODUODENOSCOPY (EGD) WITH PROPOFOL;  Surgeon: Toledo, Benay Pike, MD;  Location: [facility] ENDOSCOPY;  Service: Gastroenterology;  Laterality: N/A;  . foot spurs    . FRACTURE SURGERY    . IRRIGATION AND DEBRIDEMENT KNEE Left 10/16/2018   Procedure: IRRIGATION AND DEBRIDEMENT KNEE ARTHROSCOPY;  Surgeon: Lovell Sheehan, MD;  Location: [facility] ORS;  Service: Orthopedics;  Laterality: Left;  Marland Kitchen MANDIBLE FRACTURE SURGERY    . TEE WITHOUT CARDIOVERSION N/A 10/18/2018   Procedure: TRANSESOPHAGEAL ECHOCARDIOGRAM (TEE);  Surgeon: Corey Skains, MD;  Location: [facility] ORS;  Service: Cardiovascular;  Laterality: N/A;  . TUBAL LIGATION      Social History   Socioeconomic History  . Marital status: Single    Spouse name: Not on file  . Number of children: Not on file  . Years of education: Not on file  . Highest education level: Not on file  Occupational History  . Not on file  Social Needs  . Financial resource strain: Not on file  . Food insecurity    Worry: Not on file  Inability: Not on file  . Transportation needs    Medical: Not on file    Non-medical: Not on file  Tobacco Use  . Smoking status: Current Every Day Smoker    Types: Cigarettes  . Smokeless tobacco: Never Used  Substance and Sexual Activity  . Alcohol use: Never    Frequency: Never  . Drug use: Yes    Types: "Crack" cocaine, Marijuana  . Sexual activity: Not on file  Lifestyle  . Physical activity    Days per week: Not on file    Minutes per session: Not on file  . Stress: Not on file  Relationships  . Social Herbalist on phone: Not on file    Gets together: Not on file    Attends  religious service: Not on file    Active member of club or organization: Not on file    Attends meetings of clubs or organizations: Not on file    Relationship status: Not on file  . Intimate partner violence    Fear of current or ex partner: Not on file    Emotionally abused: Not on file    Physically abused: Not on file    Forced sexual activity: Not on file  Other Topics Concern  . Not on file  Social History Narrative  . Not on file    Family History  Problem Relation Age of Onset  . Liver cancer Mother   . Liver disease Mother   . Cirrhosis Mother   . Alcohol abuse Mother   . Cancer Father   . Colon cancer Maternal Aunt    No Known Allergies  ? Current Outpatient Medications  Medication Sig Dispense Refill  . ALPRAZolam (XANAX) 1 MG tablet Take 1 mg by mouth 4 (four) times daily.    Marland Kitchen amphetamine-dextroamphetamine (ADDERALL) 30 MG tablet Take 30 mg by mouth 2 (two) times daily.     Marland Kitchen buPROPion (WELLBUTRIN SR) 200 MG 12 hr tablet TK 1 T PO  QAM AND QD AT NOON    . clotrimazole-betamethasone (LOTRISONE) cream APP TO AFFECTED EAR ONCE OR TWICE WEEKLY PRN FOR SKIN IRRITATION.  6  . cyclobenzaprine (FLEXERIL) 5 MG tablet Take 1 tablet (5 mg total) by mouth 3 (three) times daily as needed for muscle spasms. 30 tablet 0  . diazepam (VALIUM) 5 MG tablet Take 1 tablet (5 mg total) by mouth every 8 (eight) hours as needed for anxiety. 15 tablet 0  . dicloxacillin (DYNAPEN) 500 MG capsule Take 1 capsule (500 mg total) by mouth 4 (four) times daily. 56 capsule 1  . escitalopram (LEXAPRO) 20 MG tablet Take 20 mg by mouth daily.     . fluticasone (FLONASE) 50 MCG/ACT nasal spray Place 1 spray into both nostrils daily. 1 g 2  . guanFACINE (TENEX) 2 MG tablet Take 2 mg by mouth daily.    . Multiple Vitamin (MULTIVITAMIN WITH MINERALS) TABS tablet Take 1 tablet by mouth daily. 30 tablet 0  . oxyCODONE-acetaminophen (PERCOCET/ROXICET) 5-325 MG tablet Take 1 tablet by mouth every 4 (four)  hours as needed for severe pain. 15 tablet 0  . pantoprazole (PROTONIX) 40 MG tablet Take 1 tablet (40 mg total) by mouth daily. 30 tablet 0   No current facility-administered medications for this visit.      Abtx:  Anti-infectives (From admission, onward)   None      REVIEW OF SYSTEMS:  Const: negative fever, negative chills, negative weight loss Eyes: negative  diplopia or visual changes, negative eye pain ENT: negative coryza, negative sore throat Resp: negative cough, hemoptysis, dyspnea Cards: negative for chest pain, palpitations, lower extremity edema GU: negative for frequency, dysuria and hematuria GI: Negative for abdominal pain, diarrhea, bleeding, constipation Skin: negative for rash and pruritus Heme: negative for easy bruising and gum/nose bleeding MS: Has left knee pain.  Says back is better.  Neurolo:negative for headaches, dizziness, vertigo, memory problems  Psych:  anxiety, depression  Endocrine: No polyuria or polydipsia Allergy/Immunology- negative for any medication or food allergies  Objective:  VITALS:  BP 102/64 (BP Location: Left Arm, Patient Position: Sitting, Cuff Size: Normal)   Pulse 73   Temp 97.9 F (36.6 C) (Oral)   Ht _0  (1.6 m)   Wt 173 lb 8 oz (78.7 kg)   BMI 30.73 kg/m  PHYSICAL EXAM:  General: Alert, cooperative, pale Head: Normocephalic, without obvious abnormality, atraumatic. Eyes: Conjunctivae clear, anicteric sclerae. Pupils are equal ENT did not examine because of mask Neck: Supple, symmetrical, no adenopathy, thyroid: non tender no carotid bruit and no JVD. Back: No back  tenderness Lungs: Clear to auscultation bilaterally. No Wheezing or Rhonchi. No rales. Heart: Regular rate and rhythm, no murmur, rub or gallop. Abdomen: Soft, non-tender,not distended. Bowel sounds normal. No masses Extremities there is some warmth on the left knee.  No erythema or swelling appreciated.  On making her walk she has knock knees Left foot  third toe has healed well Skin: No rashes or lesions. Or bruising Lymph: Cervical, supraclavicular normal. Neurologic: Grossly non-focal Pertinent Labs Lab Results CBC    Component Value Date/Time   WBC 7.3 12/22/2018 1128   RBC 3.75 (L) 12/22/2018 1128   HGB 10.7 (L) 12/22/2018 1128   HGB 12.8 06/02/2011 1038   HCT 34.9 (L) 12/22/2018 1128   HCT 38.5 06/02/2011 1038   PLT 445 (H) 12/22/2018 1128   PLT 344 06/02/2011 1038   MCV 93.1 12/22/2018 1128   MCV 99 06/02/2011 1038   MCH 28.5 12/22/2018 1128   MCHC 30.7 12/22/2018 1128   RDW 14.8 12/22/2018 1128   RDW 13.2 06/02/2011 1038   LYMPHSABS 2.1 12/22/2018 1128   LYMPHSABS 3.7 (H) 06/02/2011 1038   MONOABS 0.6 12/22/2018 1128   MONOABS 0.9 (H) 06/02/2011 1038   EOSABS 0.6 (H) 12/22/2018 1128   EOSABS 1.0 (H) 06/02/2011 1038   BASOSABS 0.1 12/22/2018 1128   BASOSABS 0.1 06/02/2011 1038    CMP Latest Ref Rng & Units 12/22/2018 11/10/2018 11/08/2018  Glucose 70 - 99 mg/dL 104(H) 141(H) 105(H)  BUN 6 - 20 mg/dL _1 Creatinine 0.44 - 1.00 mg/dL 0.64 0.58 0.67  Sodium 135 - 145 mmol/L 142 135 137  Potassium 3.5 - 5.1 mmol/L 4.2 3.9 5.0  Chloride 98 - 111 mmol/L 106 98 99  CO2 22 - 32 mmol/L _2 Calcium 8.9 - 10.3 mg/dL 9.4 9.1 9.6  Total Protein 6.5 - 8.1 g/dL 7.6 - -  Total Bilirubin 0.3 - 1.2 mg/dL 0.3 - -  Alkaline Phos 38 - 126 U/L 110 - -  AST 15 - 41 U/L 18 - -  ALT 0 - 44 U/L 12 - -   ESR     Microbiology: No results found for this or any previous visit (from the past 240 hour(s)).  IMAGING RESULTS:no films? Impression/Recommendation ? ?53 year old female with history of disseminated MSSA infection in June and was treated for MSSA bacteremia, left septic arthritis with IND on  10/16/2018, left third toe infection which was also IND.  Had a paraspinal abscess at T10-T11 vertebral area with facet joint infection and had aspiration.  Cultures were negative.  On discharge from the hospital a ESR on 719  was 15.  She received 4 weeks of IV antibiotics and then was discharged on p.o. dicloxacillin to complete another 4 weeks which she completed on 12/20/2018.  Her last CRP is 4 and ESR is 61. She has underlying psoriatic arthritis.  But has not been on treatment for some time.  Left knee pain and difficulty in walking is her main complaint now. We will discuss with Dr. Harlow Mares to plan for an arthrocentesis to see whether there is any evidence of infection.  If no infection it could be from underlying arthritis.  If it is equivocal then we would continue antibiotics for another 4 weeks..  Discussed the management with the patient and she is in agreement with the plan be made for her.  Also communicated with Dr. Harlow Mares.  Depression anxiety on multiple medication including Xanax, Lexapro, Wellbutrin.  Smoker continues to smoke counseled about quitting  Follow-up 1 month or earlier depending on Ortho recommendation.  note:  This document was prepared using Dragon voice recognition software and may include unintentional dictation errors.

## 2018-12-25 ENCOUNTER — Other Ambulatory Visit: Payer: Self-pay | Admitting: Infectious Diseases

## 2018-12-25 ENCOUNTER — Telehealth: Payer: Self-pay | Admitting: Licensed Clinical Social Worker

## 2018-12-25 MED ORDER — DICLOXACILLIN SODIUM 500 MG PO CAPS: 500.0000 mg | ORAL_CAPSULE | Freq: Four times a day (QID) | ORAL | 0 refills | Status: DC

## 2018-12-25 NOTE — Telephone Encounter (Signed)
Patient called wanting to know if she was going to have her antibiotics called in. According to the note from yesterdays visit, she is to see her orthopedic doctor first to determine if it is infection or psoriatic arthritis. Patient states she really needs the antibiotic and has never had a arthritic flare. Please advise.  Per Dr. Delaine Lame she will call in 1 month supple of Doxycycline. Explained to the patient she will need to continue taking it every 6 hours and with a probiotic. Patient also needs to schedule an appointment with Ortho. Patient agreed with this plan.

## 2018-12-25 NOTE — Progress Notes (Signed)
Pt wanted to continue Diclox as she thinkks after stopping antibiotic the knee got worse,  has not made an appt with ortho yet- Discussed with Dr.Bowers yesterday. So Will send prescription for a month and may be after that if needed arthrocentesis can be done

## 2019-01-23 ENCOUNTER — Ambulatory Visit: Payer: Medicaid Other | Admitting: Infectious Diseases

## 2019-01-26 ENCOUNTER — Other Ambulatory Visit
Admission: RE | Admit: 2019-01-26 | Discharge: 2019-01-26 | Disposition: A | Discharge status: Home / Self Care | Payer: Medicaid Other | Attending: Infectious Diseases | Admitting: Infectious Diseases

## 2019-01-26 DIAGNOSIS — M00062 Staphylococcal arthritis, left knee: Secondary | ICD-10-CM | POA: Insufficient documentation

## 2019-01-26 LAB — SEDIMENTATION RATE: Sed Rate: 46 mm/h — ABNORMAL HIGH (ref 0–30)

## 2019-01-26 LAB — C-REACTIVE PROTEIN: CRP: 1 mg/dL — ABNORMAL HIGH (ref <1.0)

## 2019-01-28 ENCOUNTER — Other Ambulatory Visit: Payer: Self-pay

## 2019-01-28 ENCOUNTER — Ambulatory Visit: Payer: Medicaid Other | Attending: Infectious Diseases | Admitting: Infectious Diseases

## 2019-01-28 DIAGNOSIS — L089 Local infection of the skin and subcutaneous tissue, unspecified: Secondary | ICD-10-CM | POA: Diagnosis not present

## 2019-01-28 DIAGNOSIS — M00062 Staphylococcal arthritis, left knee: Secondary | ICD-10-CM

## 2019-01-28 DIAGNOSIS — G061 Intraspinal abscess and granuloma: Secondary | ICD-10-CM

## 2019-01-28 DIAGNOSIS — M4644 Discitis, unspecified, thoracic region: Secondary | ICD-10-CM | POA: Diagnosis not present

## 2019-01-28 DIAGNOSIS — R7881 Bacteremia: Secondary | ICD-10-CM | POA: Diagnosis not present

## 2019-01-28 DIAGNOSIS — A4901 Methicillin susceptible Staphylococcus aureus infection, unspecified site: Secondary | ICD-10-CM

## 2019-01-28 DIAGNOSIS — L4050 Arthropathic psoriasis, unspecified: Secondary | ICD-10-CM

## 2019-01-28 DIAGNOSIS — B9561 Methicillin susceptible Staphylococcus aureus infection as the cause of diseases classified elsewhere: Secondary | ICD-10-CM

## 2019-01-28 DIAGNOSIS — Z792 Long term (current) use of antibiotics: Secondary | ICD-10-CM

## 2019-01-28 NOTE — Progress Notes (Signed)
The purpose of this virtual visit is to provide medical care while limiting exposure to the novel coronavirus (COVID19) for both patient and office staff.   Consent was obtained for phone visit:  Yes.   Answered questions that patient had about telehealth interaction:  Yes.   I discussed the limitations, risks, security and privacy concerns of performing an evaluation and management service by telephone. I also discussed with the patient that there may be a patient responsible charge related to this service. The patient expressed understanding and agreed to proceed.   Patient Location: work Provider Location:office   [name] is a 53 year old  female with history of psoriatic arthritis was in [facility] between 10/13/2018 until 11/11/2018 for extensive staph aureus infection which included bacteremia, left knee septic arthritis, left third toe infection, thoracic vertebra discitis as well as paraspinal abscess at the level of T10-T11.  During her stay she underwent left knee arthrocentesis, third toe drainage and paraspinal abscess aspiration by IR.  After receiving 4 weeks of IV antibiotics she has received now 8 weeks of oral dicloxacillin.  She says she is doing very well.  Pain in the left knee is much improved.  Her back pain is improved.  She is gone back to work and she is a Educational psychologist at [employer]. She does not have any fever.  She saw orthopedic surgeon who advised her to follow-up with rheumatologist for psoriatic arthritis which could be flaring up now especially in her hands.  Her last blood work from 01/26/2019 shows a CRP of 1.  It was 11.9 on 11/10/2018.  Her ESR was 46 and it was 61 on 12/22/2018. [name] will complete her dicloxacillin in 3 days time now.  She does not need any further antibiotics.  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  She will need to follow-up with rheumatologist. [name] understands the plan and she will follow-up with me as needed.

## 2019-10-03 IMAGING — MR MRI THORACIC SPINE WITHOUT AND WITH CONTRAST
7 of 9 series · 30 of 48 positions shown · IV contrast (gadavist)
Comparison: 10/16/2018 MRI of the thoracic spine. 10/18/2018 CT
chest.

CLINICAL DATA: 52 y/o F; thoracic vertebral infection with
prominent paraspinous phlegmon.

EXAM:
MRI THORACIC WITHOUT AND WITH CONTRAST
TECHNIQUE: Multiplanar and multiecho pulse sequences of the thoracic spine were
obtained without and with intravenous contrast.
CONTRAST:  7.5 cc Gadavist

[Series 18: T1 · sagittal · 6.0mm · 1.41mm/px · 1 of 9 slices shown (1 of 3)]
[im 1/9]
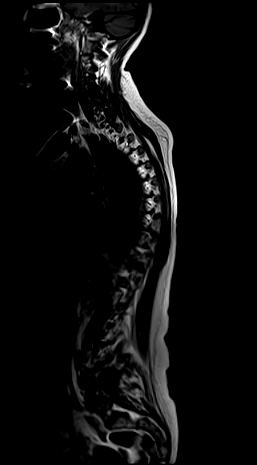

[Series 19: T2 · sagittal · 3.0mm · 1.06mm/px · 3 of 19 slices shown (1 of 2)]
[im 1/19]
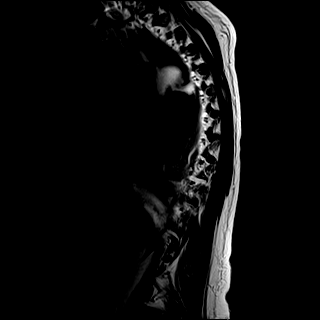
[im 10/19]
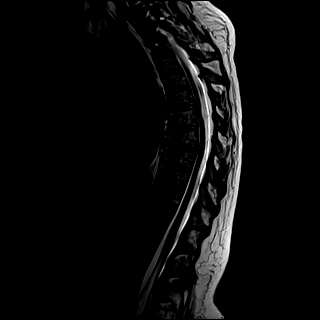
[im 19/19]
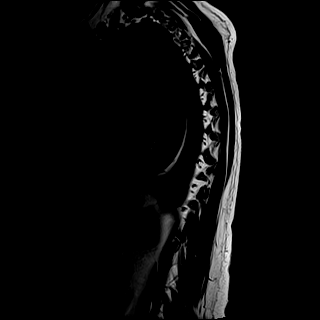

[Series 20: T1 · sagittal · 3.0mm · 1.06mm/px · 4 of 19 slices shown (2 of 3)]
[im 1/19]
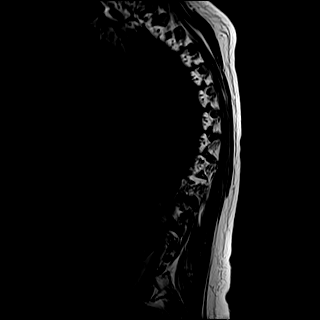
[im 7/19]
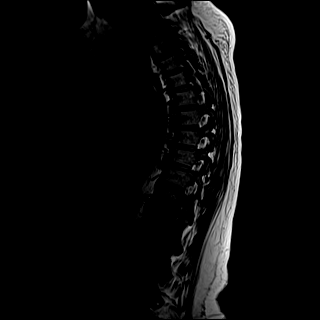
[im 13/19]
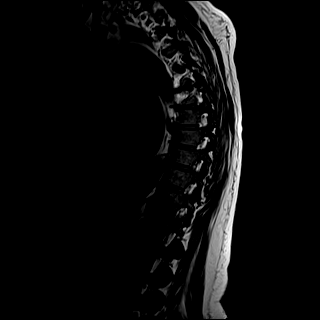
[im 19/19]
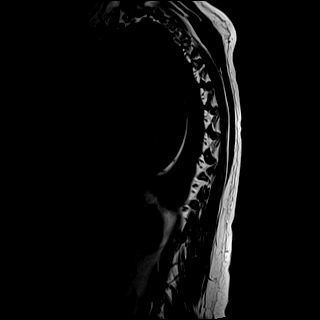

[Series 21: STIR · sagittal · 3.0mm · 0.53mm/px · 2 of 19 slices shown]
[im 1/19]
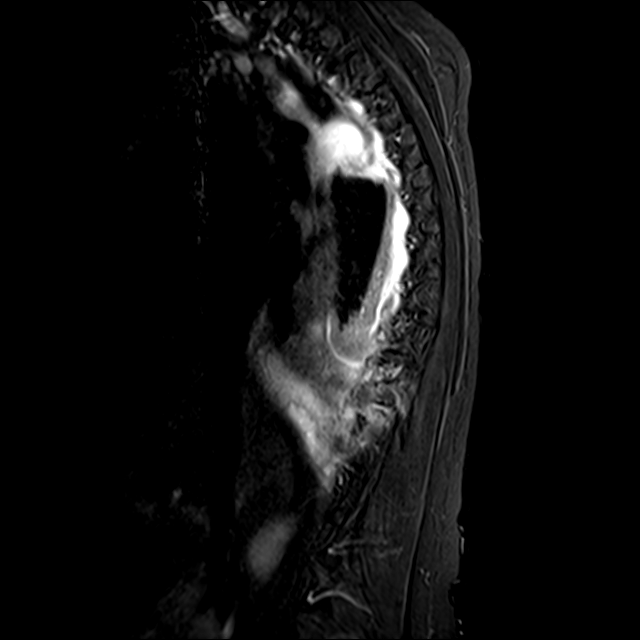
[im 7/19]
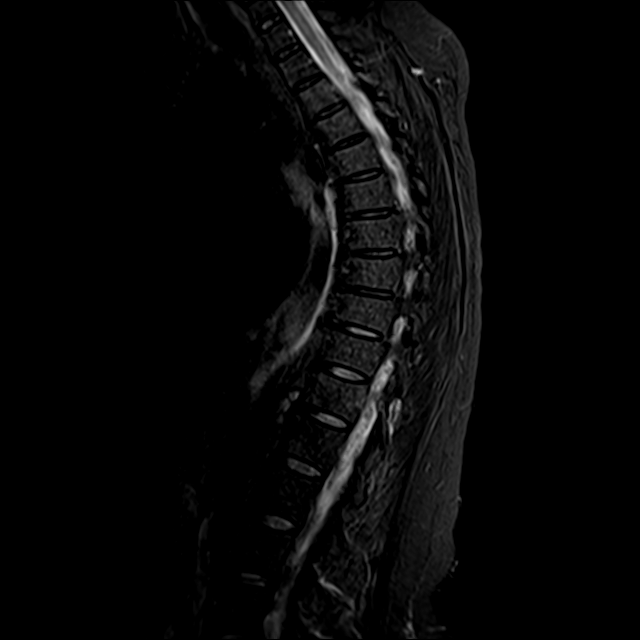

[Series 22: T2 · axial · 4.0mm · 0.59mm/px · z∈[-313,-147]mm · 8 of 39 slices shown (2 of 2)]
[im 1/39]
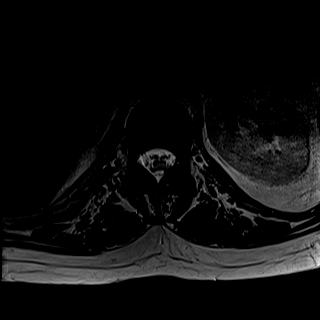
[im 6/39]
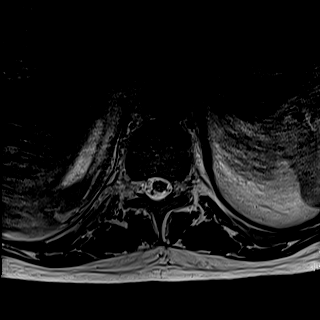
[im 11/39]
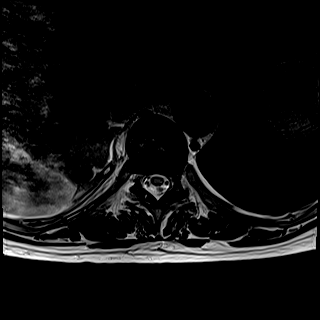
[im 17/39]
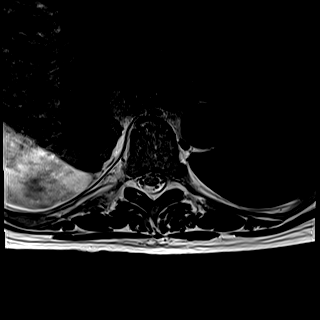
[im 22/39]
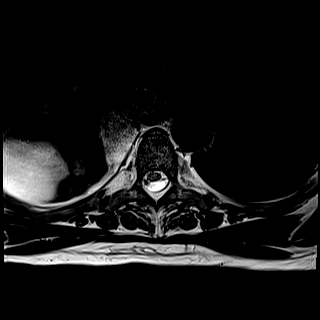
[im 28/39]
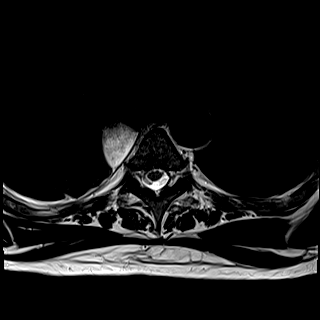
[im 33/39]
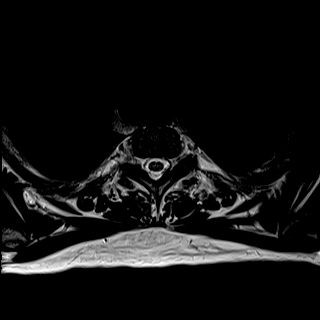
[im 39/39]
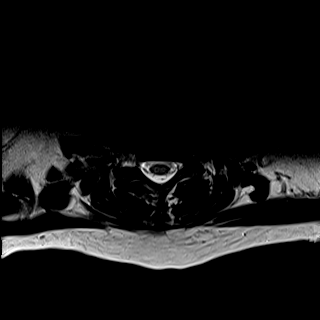

[Series 24: T1 · axial · non-contrast · 4.0mm · 0.37mm/px · z∈[-313,-147]mm · 8 of 39 slices shown (3 of 3)]
[im 1/39]
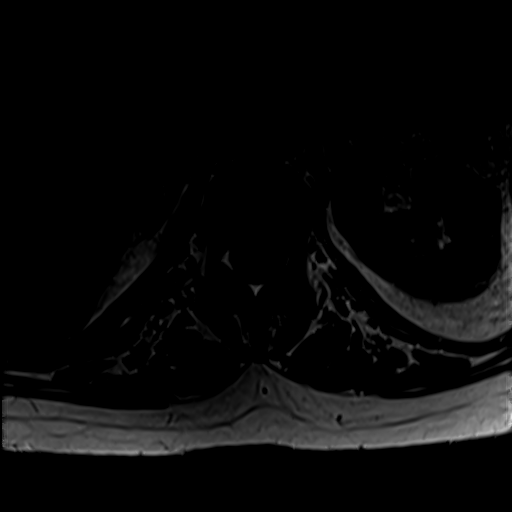
[im 6/39]
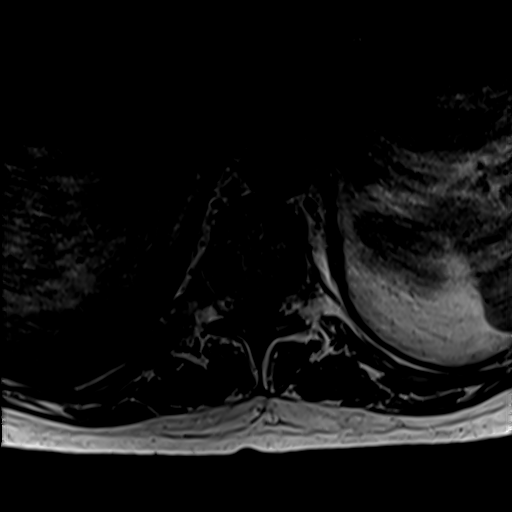
[im 11/39]
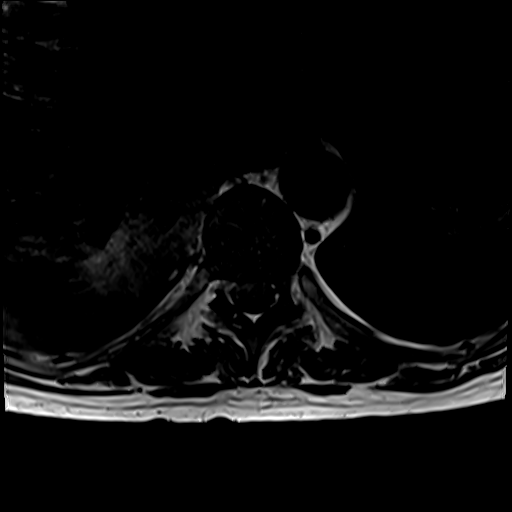
[im 17/39]
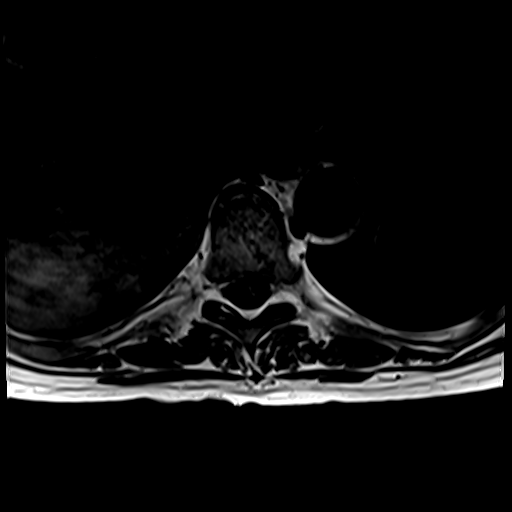
[im 22/39]
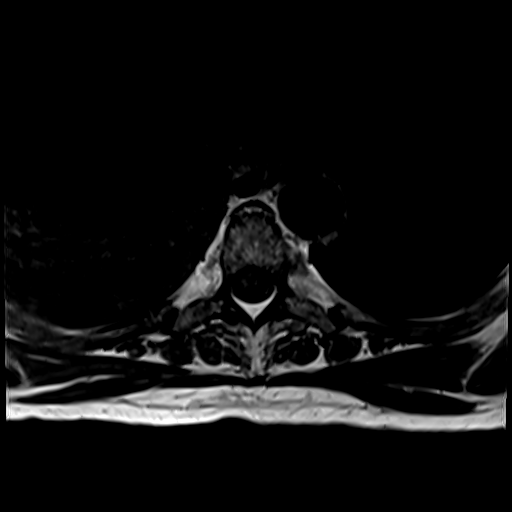
[im 28/39]
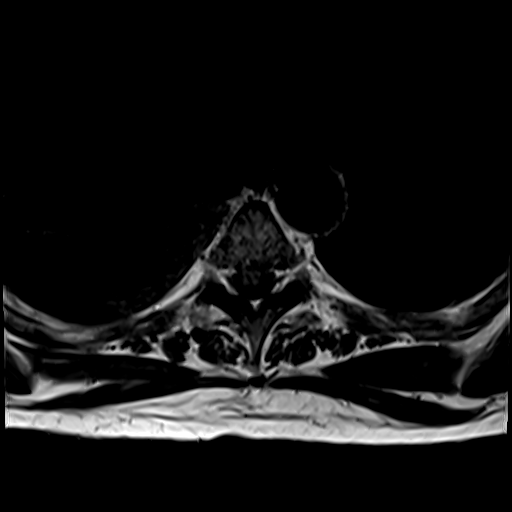
[im 33/39]
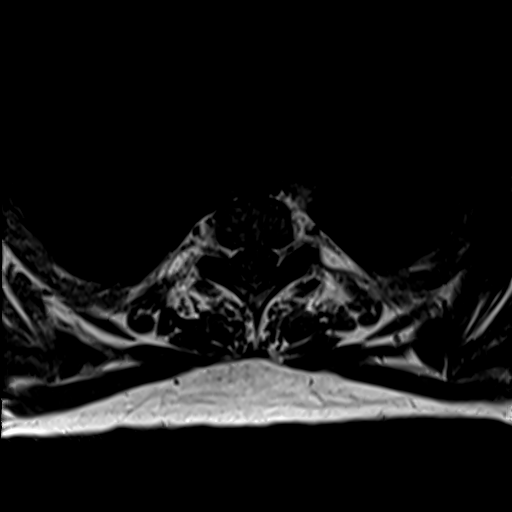
[im 39/39]
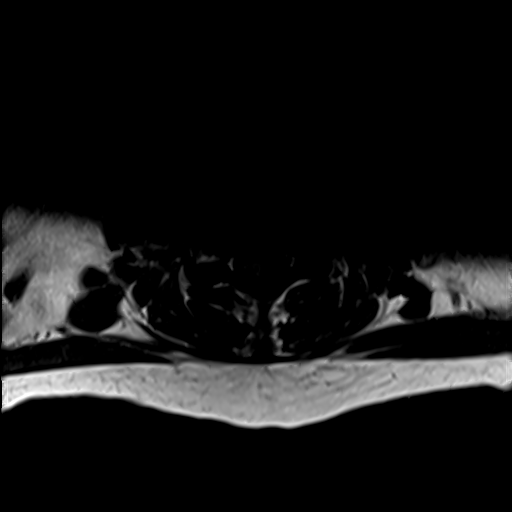

[Series 25: T1 fat-sat post-contrast · sagittal · 3.0mm · 1.33mm/px · 4 of 19 slices shown]
[im 1/19]
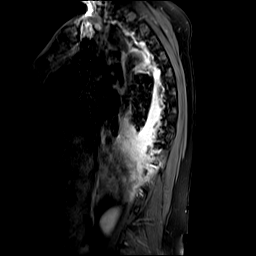
[im 7/19]
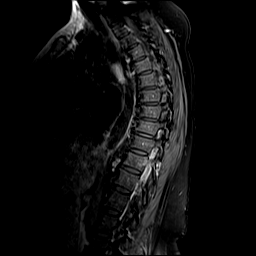
[im 13/19]
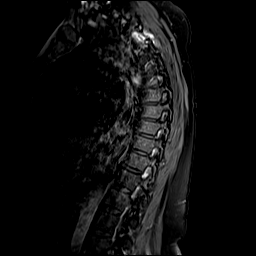
[im 19/19]
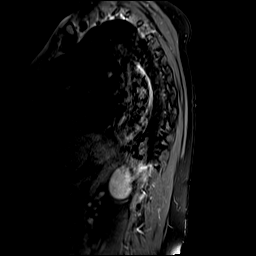

[30 of 48 positions shown; findings below may reference images not displayed]

FINDINGS: MRI THORACIC SPINE FINDINGS

Alignment:  Physiologic.

Vertebrae: Enhancement and edema within the right T11 vertebral body
and pedicle, right T11 rib head, right T11 costovertebral joint, and
right T10-11 facet with surrounding inflammation in soft tissues
compatible with septic arthritis. Paraspinal phlegmon to the right
of T10 and T11 vertebral bodies is decreased in size measuring 1.6 x
0.8 x 2.8 cm (AP x ML x CC series 21 image 3 and series 22 image
30).

Persistent enhancement of the left T3-4 facet joint and surrounding
soft tissues. No associated fluid collection.

Cord: Normal signal and morphology. Ventral epidural fluid
collection is decreased in craniocaudal extent extending from T12-L2
(series 21, image 9 and series 25, image 10).

Paraspinal and other soft tissues: Moderate right pleural effusion.

Disc levels:

T8-9 central disc protrusion with contact on the anterior cord and
mild anterior cord flattening. No significant foraminal or spinal
canal stenosis. Multilevel disc desiccation.
IMPRESSION: 1. Persistent septic arthritis of the right T10-11 facet and right
T11 costovertebral joint. Associate right paraspinal abscess is
decreased in size measuring up to 2.8 cm, previously 4.6 cm.
2. Stable suspect septic arthritis of left T3-4 facet joint without
associated abscess.
3. No residual enhancement of the right T6-7 facets.
4. Decreased craniocaudal extent of ventral epidural collection
extending from T12-L2.
5. Moderate right pleural effusion.

## 2019-12-15 ENCOUNTER — Ambulatory Visit: Payer: Self-pay

## 2020-03-28 ENCOUNTER — Encounter: Payer: Self-pay | Admitting: Emergency Medicine

## 2020-03-28 ENCOUNTER — Other Ambulatory Visit: Payer: Self-pay

## 2020-03-28 ENCOUNTER — Ambulatory Visit
Admission: EM | Admit: 2020-03-28 | Discharge: 2020-03-28 | Disposition: A | Discharge status: Home / Self Care | Payer: Medicaid Other | Attending: Family Medicine | Admitting: Family Medicine

## 2020-03-28 DIAGNOSIS — H60311 Diffuse otitis externa, right ear: Secondary | ICD-10-CM | POA: Diagnosis not present

## 2020-03-28 DIAGNOSIS — M25532 Pain in left wrist: Secondary | ICD-10-CM | POA: Diagnosis not present

## 2020-03-28 MED ORDER — MELOXICAM 15 MG PO TABS: 15.0000 mg | ORAL_TABLET | Freq: Every day | ORAL | 0 refills | Status: AC | PRN

## 2020-03-28 MED ORDER — CIPROFLOXACIN-DEXAMETHASONE 0.3-0.1 % OT SUSP: 4.0000 [drp] | Freq: Two times a day (BID) | OTIC | 0 refills | Status: AC

## 2020-03-28 MED ORDER — CIPROFLOXACIN HCL 500 MG PO TABS: 500.0000 mg | ORAL_TABLET | Freq: Two times a day (BID) | ORAL | 0 refills | Status: AC

## 2020-03-28 NOTE — ED Triage Notes (Signed)
Patient c/o right ear pain and swelling that started last night.  Patient denies fevers.

## 2020-03-28 NOTE — Discharge Instructions (Signed)
Medication as prescribed.  Follow up with your physician.  Take care  Dr. Aletta Edmunds  

## 2020-03-28 NOTE — ED Provider Notes (Signed)
[facility]    CSN: [id number] Arrival date & time: 03/28/20  1501      History   Chief Complaint Chief Complaint  Patient presents with  . Otalgia    right   HPI  54 year old female presents with multiple complaints.  Patient reports severe right ear pain and swelling which started last night.  She states that she has known psoriasis and subsequent psoriatic arthritis.  She states that this often affects her ears.  Her pain is currently 7/10 in severity.  There is visible right ear swelling and erythema.  No fever at this time.  Additionally, patient reports headache.  She also reports left wrist pain.  She reports difficulty opening things and using her hand and wrist.  She is not followed by rheumatology.  She is taking ibuprofen for the pain.  Past Medical History:  Diagnosis Date  . Anxiety   . Arthritis    PSORIATIC ARTHRITIS  . Back pain   . Colon polyp   . Depression   . Headache    MIGRAINE  . Ulcer     Patient Active Problem List   Diagnosis Date Noted  . MSSA bacteremia 11/19/2018  . Paraspinal abscess ([place]) 11/19/2018  . 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 Substance abuse ([place]) 10/18/2018  . Sepsis ([place]) 10/13/2018  . Cocaine use 01/18/2017  . Vitamin D insufficiency 01/18/2017  . Long term prescription benzodiazepine use 11/27/2016  . Chronic low back pain (Primary Area of Pain) (Right) 11/27/2016  . Chronic lower extremity pain (Secondary Area of pain) (Right) 11/27/2016  . Chronic hip pain [facility] [place] Area of Pain) (Right) 11/27/2016  . Chronic knee pain (Fourth Area of Pain) (Right) 11/27/2016  . Chronic foot pain (Fifth Area of Pain) (Right) 11/27/2016  . Chronic shoulder pain (Bilateral) (R>L) 11/27/2016  . Chronic neck pain (Right) 11/27/2016  . Atypical facial pain (Bilateral) (R>L) 11/27/2016  . History of TMJ syndrome 11/27/2016  . 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 Chronic sacroiliac joint pain  (Bilateral) (L>R) 11/27/2016  . Lumbar facet hypertrophy (Multilevel) (Bilateral) 11/27/2016  . Long term current use of opiate analgesic 10/23/2016  . Long term prescription opiate use 10/23/2016  . Opiate use 10/23/2016  . Chronic pain syndrome 10/23/2016  . Cystocele 12/20/2012  . Dyssynergia 12/20/2012  . Fecal incontinence 12/20/2012  . Mixed incontinence 12/20/2012  . Rectocele 12/20/2012  . Psoriasis 08/16/2012  . Psoriatic arthritis ([place]) 08/16/2012  . 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 GERD (gastroesophageal reflux disease) 05/24/2012  . Hidradenitis 05/24/2012  . Migraine 05/24/2012    Past Surgical History:  Procedure Laterality Date  . BREAST REDUCTION SURGERY    . COLONOSCOPY WITH ESOPHAGOGASTRODUODENOSCOPY (EGD)    . COLONOSCOPY WITH PROPOFOL N/A 05/19/2016   Procedure: COLONOSCOPY WITH PROPOFOL;  Surgeon: Lollie Sails, MD;  Location: [facility] ENDOSCOPY;  Service: Endoscopy;  Laterality: N/A;  . COLONOSCOPY WITH PROPOFOL N/A 03/06/2018   Procedure: COLONOSCOPY WITH PROPOFOL;  Surgeon: Toledo, Benay Pike, MD;  Location: [facility] ENDOSCOPY;  Service: Gastroenterology;  Laterality: N/A;  . ESOPHAGOGASTRODUODENOSCOPY (EGD) WITH PROPOFOL N/A 03/06/2018   Procedure: ESOPHAGOGASTRODUODENOSCOPY (EGD) WITH PROPOFOL;  Surgeon: Toledo, Benay Pike, MD;  Location: [facility] ENDOSCOPY;  Service: Gastroenterology;  Laterality: N/A;  . foot spurs    . FRACTURE SURGERY    . IRRIGATION AND DEBRIDEMENT KNEE Left 10/16/2018   Procedure: IRRIGATION AND DEBRIDEMENT KNEE ARTHROSCOPY;  Surgeon: Lovell Sheehan, MD;  Location: [facility] ORS;  Service: Orthopedics;  Laterality: Left;  Marland Kitchen MANDIBLE FRACTURE SURGERY    . TEE WITHOUT CARDIOVERSION N/A 10/18/2018   Procedure: TRANSESOPHAGEAL ECHOCARDIOGRAM (TEE);  Surgeon: Corey Skains, MD;  Location: [facility] ORS;  Service: Cardiovascular;  Laterality: N/A;  . TUBAL LIGATION      OB History   No obstetric history on file.      Home  Medications    Prior to Admission medications   Medication Sig Start Date End Date Taking? Authorizing Provider  ALPRAZolam Duanne Moron) 1 MG tablet Take 1 mg by mouth 4 (four) times daily.   Yes [provider]  amphetamine-dextroamphetamine (ADDERALL) 30 MG tablet Take 30 mg by mouth 2 (two) times daily.    Yes [provider]  escitalopram (LEXAPRO) 20 MG tablet Take 20 mg by mouth daily.    Yes [provider]  buPROPion (WELLBUTRIN SR) 200 MG 12 hr tablet TK 1 T PO  QAM AND QD AT NOON 08/13/18   [provider]  ciprofloxacin (CIPRO) 500 MG tablet Take 1 tablet (500 mg total) by mouth 2 (two) times daily. 03/28/20   Coral Spikes, DO  ciprofloxacin-dexamethasone (CIPRODEX) OTIC suspension Place 4 drops into the right ear 2 (two) times daily for 7 days. 03/28/20 04/04/20  Coral Spikes, DO  clotrimazole-betamethasone (LOTRISONE) cream APP TO AFFECTED EAR ONCE OR TWICE WEEKLY PRN FOR SKIN IRRITATION. 11/09/16   [provider]  cyclobenzaprine (FLEXERIL) 5 MG tablet Take 1 tablet (5 mg total) by mouth 3 (three) times daily as needed for muscle spasms. 11/11/18   Stark Jock Jude, MD  fluticasone (FLONASE) 50 MCG/ACT nasal spray Place 1 spray into both nostrils daily. 11/11/18   Stark Jock Jude, MD  meloxicam (MOBIC) 15 MG tablet Take 1 tablet (15 mg total) by mouth daily as needed for pain. 03/28/20   Coral Spikes, DO  guanFACINE (TENEX) 2 MG tablet Take 2 mg by mouth daily. 09/08/18 03/28/20  [provider]  pantoprazole (PROTONIX) 40 MG tablet Take 1 tablet (40 mg total) by mouth daily. 11/11/18 03/28/20  Otila Back, MD    Family History Family History  Problem Relation Age of Onset  . Liver cancer Mother   . Liver disease Mother   . Cirrhosis Mother   . Alcohol abuse Mother   . Cancer Father   . Colon cancer Maternal Aunt     Social History Social History   Tobacco Use  . Smoking status: Current Every Day Smoker    Types: Cigarettes  . Smokeless  tobacco: Never Used  Vaping Use  . Vaping Use: Never used  Substance Use Topics  . Alcohol use: Never  . Drug use: Yes    Types: "Crack" cocaine, Marijuana     Allergies   Patient has no known allergies.   Review of Systems Review of Systems Per HPI  Physical Exam Triage Vital Signs ED Triage Vitals  Enc Vitals Group     BP 03/28/20 1534 107/74     Pulse Rate 03/28/20 1534 62     Resp 03/28/20 1534 16     Temp 03/28/20 1534 97.8 F (36.6 C)     Temp Source 03/28/20 1534 Oral     SpO2 03/28/20 1534 100 %     Weight 03/28/20 1530 168 lb (76.2 kg)     Height 03/28/20 1530 5\' 2"  (1.575 m)     Head Circumference --      Peak Flow --      Pain Score 03/28/20 1530  7     Pain Loc --      Pain Edu? --      Excl. in [place]? --    Updated Vital Signs BP 107/74 (BP Location: Right Arm)   Pulse 62   Temp 97.8 F (36.6 C) (Oral)   Resp 16   Ht 5\' 2"  (1.575 m)   Wt 76.2 kg   LMP 01/22/2017 (LMP Unknown) Comment: no period in a year   SpO2 100%   BMI 30.73 kg/m   Visual Acuity Right Eye Distance:   Left Eye Distance:   Bilateral Distance:    Right Eye Near:   Left Eye Near:    Bilateral Near:     Physical Exam Constitutional:      Comments: Disheveled.  There are leaves and debris in her hair.  HENT:     Head: Normocephalic and atraumatic.     Ears:     Comments: Right ear with tragal tenderness.  Canal is erythematous with debris.  There is extensive erythema and swelling of the external ear which seems to be spreading anteriorly. Eyes:     General:        Right eye: No discharge.        Left eye: No discharge.     Conjunctiva/sclera: Conjunctivae normal.  Cardiovascular:     Rate and Rhythm: Normal rate.  Pulmonary:     Effort: Pulmonary effort is normal. No respiratory distress.  Musculoskeletal:     Comments: Left wrist -tenderness on the radial aspect.  There is no appreciable swelling at this time.  Neurological:     Mental Status: She is alert.     UC Treatments / Results  Labs (all labs ordered are listed, but only abnormal results are displayed) Labs Reviewed - No data to display  EKG   Radiology No results found.  Procedures Procedures (including critical care time)  Medications Ordered in UC Medications - No data to display  Initial Impression / Assessment and Plan / UC Course  I have reviewed the triage vital signs and the nursing notes.  Pertinent labs & imaging results that were available during my care of the patient were reviewed by me and considered in my medical decision making (see chart for details).    54 year old female presents with diffuse and severe otitis externa.  Treating with Ciprodex and oral ciprofloxacin.  Regarding her wrist pain, this is likely secondary to psoriatic arthritis.  Meloxicam as directed.  Needs to see rheumatology.  Final Clinical Impressions(s) / UC Diagnoses   Final diagnoses:  Acute diffuse otitis externa of right ear  Left wrist pain     Discharge Instructions     Medication as prescribed.  Follow up with your physician.  Take care  Dr. Lacinda Axon    ED Prescriptions    Medication Sig Dispense Auth. Provider   ciprofloxacin-dexamethasone (CIPRODEX) OTIC suspension Place 4 drops into the right ear 2 (two) times daily for 7 days. 7.5 mL Owen Pagnotta G, DO   ciprofloxacin (CIPRO) 500 MG tablet Take 1 tablet (500 mg total) by mouth 2 (two) times daily. 14 tablet Earlie Arciga G, DO   meloxicam (MOBIC) 15 MG tablet Take 1 tablet (15 mg total) by mouth daily as needed for pain. 30 tablet Coral Spikes, DO     PDMP not reviewed this encounter.   Coral Spikes, [place] 03/28/20 1750

## 2020-04-22 ENCOUNTER — Other Ambulatory Visit: Payer: Self-pay | Admitting: Family Medicine

## 2020-04-28 ENCOUNTER — Ambulatory Visit: Payer: Self-pay | Admitting: *Deleted

## 2020-04-28 NOTE — Telephone Encounter (Signed)
Message from Geronimo Boot sent at 04/28/2020 4:30 PM EST  Summary: Clinical Advice   Patient has ulcers on tongue, sore throat and chest congestion. Patient would like recommendations. Please advise          Patient states symptoms of sore throat, ulcers on tongue and chest congestion in the middle of chest by her breast started on Monday. Patient's aunt and uncle tested positive for COVID and was last exposed to them on Friday. Patient states that she has checked with local pharmacies for COVID and testing was a week out. Patient states she has been unable to find a location for COVID testing. Care advice provided for the patient. Patient advised to try over the counter medications for treatment of sore throat.Patient advised to notify PCP of current symptoms for evaluation if possible. Patient scheduled for COVID testing on 05/01/19. Patient advised to seek treatment in the ED or urgent care for worsening symptoms of if SOB develops. Understanding verbalized.   Reason for Disposition . [1] COVID-19 infection suspected by caller or triager AND [2] mild symptoms (cough, fever, or others) AND [3] has not gotten tested yet  Answer Assessment - Initial Assessment Questions 1. COVID-19 DIAGNOSIS: "Who made your COVID-19 diagnosis?" "Was it confirmed by a positive lab test?" If not diagnosed by a HCP, ask "Are there lots of cases (community spread) where you live?" Note: See public health department website, if unsure.     No diagnosis at this time.  2. COVID-19 EXPOSURE: "Was there any known exposure to COVID before the symptoms began?" CDC Definition of close contact: within 6 feet (2 meters) for a total of 15 minutes or more over a 24-hour period.      Yes to aunt and uncle on Friday 3. ONSET: "When did the COVID-19 symptoms start?"      Monday 4. WORST SYMPTOM: "What is your worst symptom?" (e.g., cough, fever, shortness of breath, muscle aches)     Chest congestion 5. COUGH: "Do you have a  cough?" If Yes, ask: "How bad is the cough?"       Coughing some but not alot 6. FEVER: "Do you have a fever?" If Yes, ask: "What is your temperature, how was it measured, and when did it start?"     No 7. RESPIRATORY STATUS: "Describe your breathing?" (e.g., shortness of breath, wheezing, unable to speak)      no 8. BETTER-SAME-WORSE: "Are you getting better, staying the same or getting worse compared to yesterday?"  If getting worse, ask, "In what way?"     worse 9. HIGH RISK DISEASE: "Do you have any chronic medical problems?" (e.g., asthma, heart or lung disease, weak immune system, obesity, etc.)     no 10. VACCINE: "Have you gotten the COVID-19 vaccine?" If Yes ask: "Which one, how many shots, when did you get it?"       Moderna- fully vaccinated 11. PREGNANCY: "Is there any chance you are pregnant?" "When was your last menstrual period?"       n/a 12. OTHER SYMPTOMS: "Do you have any other symptoms?"  (e.g., chills, fatigue, headache, loss of smell or taste, muscle pain, sore throat; new loss of smell or taste especially support the diagnosis of COVID-19)       Chest congestion and pain that is in the middle  Protocols used: CORONAVIRUS (COVID-19) DIAGNOSED OR SUSPECTED-A-AH

## 2020-04-30 ENCOUNTER — Ambulatory Visit: Payer: Self-pay

## 2020-04-30 ENCOUNTER — Other Ambulatory Visit: Payer: Medicaid Other

## 2020-04-30 NOTE — Telephone Encounter (Signed)
Phone call ret'd to pt.  Reported onset of sore throat, nausea, and chest heaviness on Monday. Described chest as "feeling like something is sitting on it." Rated pain at 6-7/10.  Stated is short of breath at rest; feels "winded".  Reported nausea.  Has had decreased fluid intake.  Reported chills, and does not have a thermometer.  Advised to go to ER for eval of symptoms.  Pt. Encouraged to have someone drive her.  Reported no transportation; is trying to arrange transport from a relative.  Pt. Agreed with recommendations.      Reason for Disposition . MODERATE difficulty breathing (e.g., speaks in phrases, SOB even at rest, pulse 100-120)  Answer Assessment - Initial Assessment Questions 1. COVID-19 DIAGNOSIS: "Who made your COVID-19 diagnosis?" "Was it confirmed by a positive lab test?" If not diagnosed by a HCP, ask "Are there lots of cases (community spread) where you live?" Note: See public health department website, if unsure.     Home test on 1/3 2. COVID-19 EXPOSURE: "Was there any known exposure to COVID before the symptoms began?" CDC Definition of close contact: within 6 feet (2 meters) for a total of 15 minutes or more over a 24-hour period.      *No Answer* 3. ONSET: "When did the COVID-19 symptoms start?"      04/26/20 4. WORST SYMPTOM: "What is your worst symptom?" (e.g., cough, fever, shortness of breath, muscle aches)     Chest heaviness- "feels like something is sitting on it"; sore throat 5. COUGH: "Do you have a cough?" If Yes, ask: "How bad is the cough?"       cough 6. FEVER: "Do you have a fever?" If Yes, ask: "What is your temperature, how was it measured, and when did it start?"    Feels chilled; doesn't have a thermometer 7. RESPIRATORY STATUS: "Describe your breathing?" (e.g., shortness of breath, wheezing, unable to speak)      Short of breath  8. BETTER-SAME-WORSE: "Are you getting better, staying the same or getting worse compared to yesterday?"  If getting worse,  ask, "In what way?"     Chest heaviness and sore throat 9. HIGH RISK DISEASE: "Do you have any chronic medical problems?" (e.g., asthma, heart or lung disease, weak immune system, obesity, etc.)     Psoriatic arthitis 10. VACCINE: "Have you gotten the COVID-19 vaccine?" If Yes ask: "Which one, how many shots, when did you get it?"       Vaccine x 2 11. PREGNANCY: "Is there any chance you are pregnant?" "When was your last menstrual period?"       menopausal 12. OTHER SYMPTOMS: "Do you have any other symptoms?"  (e.g., chills, fatigue, headache, loss of smell or taste, muscle pain, sore throat; new loss of smell or taste especially support the diagnosis of COVID-19)       Sore throat, nausea, chest heaviness, feels more winded  Protocols used: CORONAVIRUS (COVID-19) DIAGNOSED OR SUSPECTED-A-AH Message from Nani Ravens sent at 04/30/2020 2:39 PM EST  Pt called in for assistance. Pt says that she was Dx with covid on Monday. Pt says that she has a sore throat. pt would like a call back from a nurse to be advised further.

## 2020-05-01 ENCOUNTER — Encounter: Payer: Self-pay | Admitting: Emergency Medicine

## 2020-05-01 ENCOUNTER — Other Ambulatory Visit: Payer: Self-pay

## 2020-05-01 ENCOUNTER — Emergency Department
Admission: EM | Admit: 2020-05-01 | Discharge: 2020-05-01 | Disposition: A | Discharge status: Home / Self Care | Payer: Medicaid Other | Attending: Emergency Medicine | Admitting: Emergency Medicine

## 2020-05-01 DIAGNOSIS — R070 Pain in throat: Secondary | ICD-10-CM | POA: Insufficient documentation

## 2020-05-01 DIAGNOSIS — F17210 Nicotine dependence, cigarettes, uncomplicated: Secondary | ICD-10-CM | POA: Insufficient documentation

## 2020-05-01 DIAGNOSIS — R0989 Other specified symptoms and signs involving the circulatory and respiratory systems: Secondary | ICD-10-CM | POA: Diagnosis not present

## 2020-05-01 DIAGNOSIS — R059 Cough, unspecified: Secondary | ICD-10-CM | POA: Diagnosis not present

## 2020-05-01 DIAGNOSIS — R079 Chest pain, unspecified: Secondary | ICD-10-CM | POA: Diagnosis not present

## 2020-05-01 DIAGNOSIS — Z20822 Contact with and (suspected) exposure to covid-19: Secondary | ICD-10-CM

## 2020-05-01 DIAGNOSIS — R5383 Other fatigue: Secondary | ICD-10-CM | POA: Insufficient documentation

## 2020-05-01 NOTE — ED Notes (Signed)
Patient discharged home, patient received discharge papers and prescription for Celexa. Patient got belongings and verbalized he has received all of his belongings. Patient appropriate and cooperative, Denies SI/HI AVH. Vital signs taken. NAD noted.

## 2020-05-01 NOTE — ED Notes (Signed)
Patient discharged home, patient received discharge papers. Patient appropriate and cooperative. Vital signs taken. NAD noted. 

## 2020-05-01 NOTE — ED Provider Notes (Signed)
[facility] Emergency Department Provider Note   ____________________________________________    I have reviewed the triage vital signs and the nursing notes.   HISTORY  Chief Complaint Chest Pain, Sore Throat, and Cough     HPI [name] is a 55 y.o. female with a history as noted below who presents with complaints of congestion, sore throat, body aches, fatigue which started approximately 4-5 days ago.  Patient reports she has been vaccinated against COVID-19.  She also describes some chest discomfort with coughing, no pleurisy, no shortness of breath.  Has not taken anything for this  Past Medical History:  Diagnosis Date  . Anxiety   . Arthritis    PSORIATIC ARTHRITIS  . Back pain   . Colon polyp   . Depression   . Headache    MIGRAINE  . Ulcer     Patient Active Problem List   Diagnosis Date Noted  . MSSA bacteremia 11/19/2018  . Paraspinal abscess ([place]) 11/19/2018  . 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 Substance abuse ([place]) 10/18/2018  . Sepsis ([place]) 10/13/2018  . Cocaine use 01/18/2017  . Vitamin D insufficiency 01/18/2017  . Long term prescription benzodiazepine use 11/27/2016  . Chronic low back pain (Primary Area of Pain) (Right) 11/27/2016  . Chronic lower extremity pain (Secondary Area of pain) (Right) 11/27/2016  . Chronic hip pain [facility] Area of Pain) (Right) 11/27/2016  . Chronic knee pain (Fourth Area of Pain) (Right) 11/27/2016  . Chronic foot pain (Fifth Area of Pain) (Right) 11/27/2016  . Chronic shoulder pain (Bilateral) (R>L) 11/27/2016  . Chronic neck pain (Right) 11/27/2016  . Atypical facial pain (Bilateral) (R>L) 11/27/2016  . History of TMJ syndrome 11/27/2016  . 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 Chronic sacroiliac joint pain (Bilateral) (L>R) 11/27/2016  . Lumbar facet hypertrophy (Multilevel) (Bilateral) 11/27/2016  . Long term current use of opiate analgesic  10/23/2016  . Long term prescription opiate use 10/23/2016  . Opiate use 10/23/2016  . Chronic pain syndrome 10/23/2016  . Cystocele 12/20/2012  . Dyssynergia 12/20/2012  . Fecal incontinence 12/20/2012  . Mixed incontinence 12/20/2012  . Rectocele 12/20/2012  . Psoriasis 08/16/2012  . Psoriatic arthritis ([place]) 08/16/2012  . 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 GERD (gastroesophageal reflux disease) 05/24/2012  . Hidradenitis 05/24/2012  . Migraine 05/24/2012    Past Surgical History:  Procedure Laterality Date  . BREAST REDUCTION SURGERY    . COLONOSCOPY WITH ESOPHAGOGASTRODUODENOSCOPY (EGD)    . COLONOSCOPY WITH PROPOFOL N/A 05/19/2016   Procedure: COLONOSCOPY WITH PROPOFOL;  Surgeon: Lollie Sails, MD;  Location: [facility] ENDOSCOPY;  Service: Endoscopy;  Laterality: N/A;  . COLONOSCOPY WITH PROPOFOL N/A 03/06/2018   Procedure: COLONOSCOPY WITH PROPOFOL;  Surgeon: Toledo, Benay Pike, MD;  Location: [facility] ENDOSCOPY;  Service: Gastroenterology;  Laterality: N/A;  . ESOPHAGOGASTRODUODENOSCOPY (EGD) WITH PROPOFOL N/A 03/06/2018   Procedure: ESOPHAGOGASTRODUODENOSCOPY (EGD) WITH PROPOFOL;  Surgeon: Toledo, Benay Pike, MD;  Location: [facility] ENDOSCOPY;  Service: Gastroenterology;  Laterality: N/A;  . foot spurs    . FRACTURE SURGERY    . IRRIGATION AND DEBRIDEMENT KNEE Left 10/16/2018   Procedure: IRRIGATION AND DEBRIDEMENT KNEE ARTHROSCOPY;  Surgeon: Lovell Sheehan, MD;  Location: [facility] ORS;  Service: Orthopedics;  Laterality: Left;  Marland Kitchen MANDIBLE FRACTURE SURGERY    . TEE WITHOUT CARDIOVERSION N/A 10/18/2018   Procedure: TRANSESOPHAGEAL ECHOCARDIOGRAM (TEE);  Surgeon: Corey Skains, MD;  Location: [facility] ORS;  Service: Cardiovascular;  Laterality:  N/A;  . TUBAL LIGATION      Prior to Admission medications   Medication Sig Start Date End Date Taking? Authorizing Provider  ALPRAZolam Duanne Moron) 1 MG tablet Take 1 mg by mouth 4 (four) times daily.    [provider]   amphetamine-dextroamphetamine (ADDERALL) 30 MG tablet Take 30 mg by mouth 2 (two) times daily.     [provider]  buPROPion (WELLBUTRIN SR) 200 MG 12 hr tablet TK 1 T PO  QAM AND QD AT NOON 08/13/18   [provider]  ciprofloxacin (CIPRO) 500 MG tablet Take 1 tablet (500 mg total) by mouth 2 (two) times daily. 03/28/20   Cook, Barnie Del, DO  clotrimazole-betamethasone (LOTRISONE) cream APP TO AFFECTED EAR ONCE OR TWICE WEEKLY PRN FOR SKIN IRRITATION. 11/09/16   [provider]  cyclobenzaprine (FLEXERIL) 5 MG tablet Take 1 tablet (5 mg total) by mouth 3 (three) times daily as needed for muscle spasms. 11/11/18   Stark Jock, Jude, MD  escitalopram (LEXAPRO) 20 MG tablet Take 20 mg by mouth daily.     [provider]  fluticasone (FLONASE) 50 MCG/ACT nasal spray Place 1 spray into both nostrils daily. 11/11/18   Stark Jock Jude, MD  meloxicam (MOBIC) 15 MG tablet Take 1 tablet (15 mg total) by mouth daily as needed for pain. 03/28/20   Coral Spikes, DO  guanFACINE (TENEX) 2 MG tablet Take 2 mg by mouth daily. 09/08/18 03/28/20  [provider]  pantoprazole (PROTONIX) 40 MG tablet Take 1 tablet (40 mg total) by mouth daily. 11/11/18 03/28/20  Otila Back, MD     Allergies Patient has no known allergies.  Family History  Problem Relation Age of Onset  . Liver cancer Mother   . Liver disease Mother   . Cirrhosis Mother   . Alcohol abuse Mother   . Cancer Father   . Colon cancer Maternal Aunt     Social History Social History   Tobacco Use  . Smoking status: Current Every Day Smoker    Types: Cigarettes  . Smokeless tobacco: Never Used  Vaping Use  . Vaping Use: Never used  Substance Use Topics  . Alcohol use: Never  . Drug use: Yes    Types: "Crack" cocaine, Marijuana    Review of Systems  Constitutional: No fever/chills Eyes: No visual changes.  ENT: Sore throat Cardiovascular: As above Respiratory: As above Gastrointestinal: No abdominal pain.   No nausea, no vomiting.   Genitourinary: Negative for dysuria. Musculoskeletal: Myalgias Skin: Negative for rash. Neurological: Mild headache   ____________________________________________   PHYSICAL EXAM:  VITAL SIGNS: ED Triage Vitals  Enc Vitals Group     BP 05/01/20 1014 122/90     Pulse Rate 05/01/20 1014 75     Resp 05/01/20 1014 18     Temp 05/01/20 1014 99.1 F (37.3 C)     Temp Source 05/01/20 1014 Oral     SpO2 05/01/20 1014 100 %     Weight --      Height --      Head Circumference --      Peak Flow --      Pain Score 05/01/20 1017 7     Pain Loc --      Pain Edu? --      Excl. in [place]? --     Constitutional: Alert and oriented.   Nose: No congestion/rhinnorhea. Mouth/Throat: Mucous membranes are moist.    Cardiovascular: Normal rate, regular rhythm. Grossly normal heart  sounds.  Good peripheral circulation. Respiratory: Normal respiratory effort.  No retractions. Lungs CTAB. Gastrointestinal: Soft and nontender. No distention.  No CVA tenderness.  Musculoskeletal: No lower extremity tenderness nor edema.  Warm and well perfused Neurologic:  Normal speech and language. No gross focal neurologic deficits are appreciated.  Skin:  Skin is warm, dry and intact. No rash noted. Psychiatric: Mood and affect are normal. Speech and behavior are normal.  ____________________________________________   LABS (all labs ordered are listed, but only abnormal results are displayed)  Labs Reviewed  SARS CORONAVIRUS 2 (TAT 6-24 HRS)   ____________________________________________  EKG  ED ECG REPORT I, Lavonia Drafts, the attending physician, personally viewed and interpreted this ECG.  Date: 05/01/2020  Rhythm: normal sinus rhythm QRS Axis: normal Intervals: normal ST/T Wave abnormalities: normal Narrative Interpretation: no evidence of acute  ischemia  ____________________________________________  RADIOLOGY  None ____________________________________________   PROCEDURES  Procedure(s) performed: No  Procedures   Critical Care performed: No ____________________________________________   INITIAL IMPRESSION / ASSESSMENT AND PLAN / ED COURSE  Pertinent labs & imaging results that were available during my care of the patient were reviewed by me and considered in my medical decision making (see chart for details).  Patient well-appearing and in no acute distress, symptoms consistent with viral illness, likely COVID-19.  Vital signs are reassuring, exam is benign.  EKG is normal.  No concern for ACS or PE, recommend supportive care, outpatient follow-up, return precautions discussed    ____________________________________________   FINAL CLINICAL IMPRESSION(S) / ED DIAGNOSES  Final diagnoses:  Suspected COVID-19 virus infection        Note:  This document was prepared using Dragon voice recognition software and may include unintentional dictation errors.   Lavonia Drafts, MD 05/01/20 1114

## 2020-05-01 NOTE — ED Triage Notes (Signed)
Pt reports sore throat, cough and some CP for the past week. Pt reports cough is not productive

## 2020-05-02 LAB — SARS CORONAVIRUS 2 (TAT 6-24 HRS): SARS Coronavirus 2: NEGATIVE

## 2020-08-04 ENCOUNTER — Other Ambulatory Visit: Payer: Self-pay | Admitting: Family Medicine

## 2021-10-27 ENCOUNTER — Ambulatory Visit: Payer: 59

## 2022-07-10 ENCOUNTER — Ambulatory Visit: Payer: 59 | Admitting: Podiatry

## 2022-07-19 ENCOUNTER — Ambulatory Visit: Payer: 59 | Admitting: Podiatry

## 2022-08-07 ENCOUNTER — Ambulatory Visit (INDEPENDENT_AMBULATORY_CARE_PROVIDER_SITE_OTHER): Payer: 59 | Admitting: Podiatry

## 2022-08-07 DIAGNOSIS — Z91199 Patient's noncompliance with other medical treatment and regimen due to unspecified reason: Secondary | ICD-10-CM

## 2022-08-08 NOTE — Progress Notes (Signed)
Patient was no-show for appointment today 

## 2023-01-19 ENCOUNTER — Other Ambulatory Visit: Payer: Self-pay | Admitting: Family Medicine

## 2023-01-19 DIAGNOSIS — R0602 Shortness of breath: Secondary | ICD-10-CM

## 2023-06-07 ENCOUNTER — Emergency Department
Admission: EM | Admit: 2023-06-07 | Discharge: 2023-06-07 | Discharge status: Against Medical Advice | Payer: 59 | Source: Home / Self Care
